# Patient Record
Sex: Female | Born: 1955 | Race: White | Hispanic: No | Smoking: Former smoker
Health system: Southern US, Community
[De-identification: ages and names within clinical notes are randomized; demographics above are authoritative.]

## PROBLEM LIST (undated history)

## (undated) DIAGNOSIS — E7800 Pure hypercholesterolemia, unspecified: Secondary | ICD-10-CM

## (undated) DIAGNOSIS — Z860100 Personal history of colon polyps, unspecified: Secondary | ICD-10-CM

## (undated) DIAGNOSIS — E039 Hypothyroidism, unspecified: Secondary | ICD-10-CM

## (undated) DIAGNOSIS — K589 Irritable bowel syndrome without diarrhea: Secondary | ICD-10-CM

## (undated) DIAGNOSIS — R7303 Prediabetes: Secondary | ICD-10-CM

## (undated) DIAGNOSIS — E559 Vitamin D deficiency, unspecified: Secondary | ICD-10-CM

## (undated) DIAGNOSIS — C189 Malignant neoplasm of colon, unspecified: Secondary | ICD-10-CM

## (undated) DIAGNOSIS — K469 Unspecified abdominal hernia without obstruction or gangrene: Secondary | ICD-10-CM

## (undated) DIAGNOSIS — Z6841 Body Mass Index (BMI) 40.0 and over, adult: Secondary | ICD-10-CM

## (undated) DIAGNOSIS — Z9889 Other specified postprocedural states: Secondary | ICD-10-CM

## (undated) DIAGNOSIS — D649 Anemia, unspecified: Secondary | ICD-10-CM

## (undated) DIAGNOSIS — C801 Malignant (primary) neoplasm, unspecified: Secondary | ICD-10-CM

## (undated) DIAGNOSIS — U071 COVID-19: Secondary | ICD-10-CM

## (undated) DIAGNOSIS — F32A Depression, unspecified: Secondary | ICD-10-CM

## (undated) DIAGNOSIS — D049 Carcinoma in situ of skin, unspecified: Secondary | ICD-10-CM

## (undated) DIAGNOSIS — I059 Rheumatic mitral valve disease, unspecified: Secondary | ICD-10-CM

## (undated) DIAGNOSIS — F419 Anxiety disorder, unspecified: Secondary | ICD-10-CM

## (undated) DIAGNOSIS — E079 Disorder of thyroid, unspecified: Secondary | ICD-10-CM

## (undated) DIAGNOSIS — F329 Major depressive disorder, single episode, unspecified: Secondary | ICD-10-CM

## (undated) DIAGNOSIS — K219 Gastro-esophageal reflux disease without esophagitis: Secondary | ICD-10-CM

## (undated) DIAGNOSIS — I10 Essential (primary) hypertension: Secondary | ICD-10-CM

## (undated) HISTORY — DX: Hypothyroidism, unspecified: E03.9

## (undated) HISTORY — DX: Unspecified abdominal hernia without obstruction or gangrene: K46.9

## (undated) HISTORY — PX: COLONOSCOPY: SHX174

## (undated) HISTORY — DX: Major depressive disorder, single episode, unspecified: F32.9

## (undated) HISTORY — PX: OTHER SURGICAL HISTORY: SHX169

## (undated) HISTORY — DX: Irritable bowel syndrome, unspecified: K58.9

## (undated) HISTORY — DX: Personal history of colon polyps, unspecified: Z86.0100

## (undated) HISTORY — DX: Vitamin D deficiency, unspecified: E55.9

## (undated) HISTORY — DX: Personal history of colonic polyps: Z86.010

## (undated) HISTORY — DX: Gastro-esophageal reflux disease without esophagitis: K21.9

## (undated) HISTORY — DX: Depression, unspecified: F32.A

## (undated) HISTORY — DX: Pure hypercholesterolemia, unspecified: E78.00

## (undated) HISTORY — PX: BLADDER NECK SUSPENSION: SHX1240

## (undated) HISTORY — PX: HERNIA REPAIR: SHX51

## (undated) HISTORY — DX: COVID-19: U07.1

## (undated) HISTORY — DX: Body Mass Index (BMI) 40.0 and over, adult: Z684

## (undated) HISTORY — DX: Malignant (primary) neoplasm, unspecified: C80.1

## (undated) HISTORY — DX: Anxiety disorder, unspecified: F41.9

## (undated) HISTORY — PX: ABDOMINAL HYSTERECTOMY: SHX81

## (undated) HISTORY — DX: Prediabetes: R73.03

## (undated) HISTORY — DX: Malignant neoplasm of colon, unspecified: C18.9

## (undated) HISTORY — DX: Carcinoma in situ of skin, unspecified: D04.9

---

## 1998-08-15 ENCOUNTER — Inpatient Hospital Stay (HOSPITAL_COMMUNITY): Admission: EM | Admit: 1998-08-15 | Discharge: 1998-08-18 | Payer: Self-pay | Admitting: Emergency Medicine

## 1998-08-20 DIAGNOSIS — R203 Hyperesthesia: Secondary | ICD-10-CM | POA: Insufficient documentation

## 1998-08-25 ENCOUNTER — Inpatient Hospital Stay (HOSPITAL_COMMUNITY): Admission: EM | Admit: 1998-08-25 | Discharge: 1998-08-28 | Payer: Self-pay

## 1998-09-02 ENCOUNTER — Encounter: Payer: Self-pay | Admitting: General Surgery

## 1998-09-02 ENCOUNTER — Inpatient Hospital Stay (HOSPITAL_COMMUNITY): Admission: EM | Admit: 1998-09-02 | Discharge: 1998-09-04 | Payer: Self-pay | Admitting: General Surgery

## 1998-09-03 ENCOUNTER — Encounter: Payer: Self-pay | Admitting: General Surgery

## 1998-09-27 ENCOUNTER — Encounter: Payer: Self-pay | Admitting: General Surgery

## 1998-09-29 ENCOUNTER — Inpatient Hospital Stay (HOSPITAL_COMMUNITY): Admission: RE | Admit: 1998-09-29 | Discharge: 1998-10-05 | Payer: Self-pay | Admitting: General Surgery

## 2000-02-29 ENCOUNTER — Other Ambulatory Visit: Admission: RE | Admit: 2000-02-29 | Discharge: 2000-02-29 | Payer: Self-pay | Admitting: Obstetrics and Gynecology

## 2000-05-21 ENCOUNTER — Ambulatory Visit (HOSPITAL_COMMUNITY): Admission: RE | Admit: 2000-05-21 | Discharge: 2000-05-21 | Payer: Self-pay | Admitting: Obstetrics and Gynecology

## 2002-01-20 ENCOUNTER — Encounter: Payer: Self-pay | Admitting: Emergency Medicine

## 2002-01-20 ENCOUNTER — Emergency Department (HOSPITAL_COMMUNITY): Admission: EM | Admit: 2002-01-20 | Discharge: 2002-01-20 | Payer: Self-pay | Admitting: Emergency Medicine

## 2002-02-09 ENCOUNTER — Other Ambulatory Visit: Admission: RE | Admit: 2002-02-09 | Discharge: 2002-02-09 | Payer: Self-pay | Admitting: Obstetrics and Gynecology

## 2002-04-02 ENCOUNTER — Inpatient Hospital Stay (HOSPITAL_COMMUNITY): Admission: RE | Admit: 2002-04-02 | Discharge: 2002-04-03 | Payer: Self-pay | Admitting: Obstetrics and Gynecology

## 2002-05-21 ENCOUNTER — Encounter: Admission: RE | Admit: 2002-05-21 | Discharge: 2002-05-21 | Payer: Self-pay | Admitting: Obstetrics and Gynecology

## 2002-05-21 ENCOUNTER — Encounter: Payer: Self-pay | Admitting: Obstetrics and Gynecology

## 2003-03-25 ENCOUNTER — Other Ambulatory Visit: Admission: RE | Admit: 2003-03-25 | Discharge: 2003-03-25 | Payer: Self-pay | Admitting: Obstetrics and Gynecology

## 2004-03-07 ENCOUNTER — Inpatient Hospital Stay (HOSPITAL_COMMUNITY): Admission: RE | Admit: 2004-03-07 | Discharge: 2004-03-11 | Payer: Self-pay | Admitting: Surgery

## 2004-06-20 ENCOUNTER — Other Ambulatory Visit: Admission: RE | Admit: 2004-06-20 | Discharge: 2004-06-20 | Payer: Self-pay | Admitting: Obstetrics and Gynecology

## 2004-08-16 ENCOUNTER — Encounter: Admission: RE | Admit: 2004-08-16 | Discharge: 2004-08-16 | Payer: Self-pay | Admitting: Surgery

## 2004-09-05 ENCOUNTER — Emergency Department (HOSPITAL_COMMUNITY): Admission: EM | Admit: 2004-09-05 | Discharge: 2004-09-05 | Payer: Self-pay | Admitting: Emergency Medicine

## 2004-09-05 ENCOUNTER — Inpatient Hospital Stay (HOSPITAL_COMMUNITY): Admission: RE | Admit: 2004-09-05 | Discharge: 2004-09-15 | Payer: Self-pay | Admitting: Surgery

## 2004-10-26 ENCOUNTER — Ambulatory Visit: Payer: Self-pay | Admitting: Critical Care Medicine

## 2004-10-26 ENCOUNTER — Inpatient Hospital Stay (HOSPITAL_COMMUNITY): Admission: RE | Admit: 2004-10-26 | Discharge: 2004-11-07 | Payer: Self-pay | Admitting: Surgery

## 2005-03-14 IMAGING — US US RENAL PORT
1 series · 14 of 18 positions shown · non-contrast
Comparison: none

CLINICAL DATA: Elevated BUN and creatinine.
 RENAL SONOGRAM:
 Right kidney 10.3 cm and left kidney 12.3 cm in length.  No evidence of hydronephrosis or no obvious renal mass.  Evaluation is somewhat limited.  On the CT scan of 10/26/04, there may be a small subcentimeter lesion within the posterior cortex of the mid aspect of the left kidney on image 10 (delayed imaging through the kidneys).  This would have to be further evaluated with follow-up CT imaging, as it is not detected on the present ultrasound.  Foley catheter in place and bladder decompressed.

[Series 1: renal · 0.36mm/px · 14 of 18 slices shown]
[im 1/18]
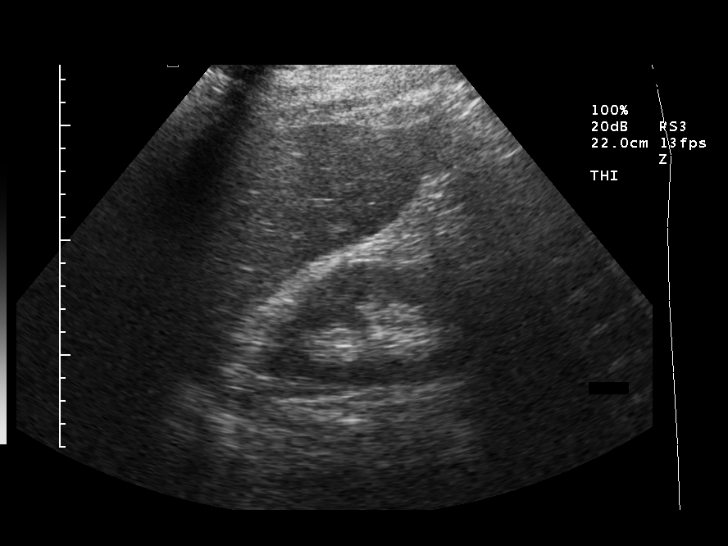
[im 2/18]
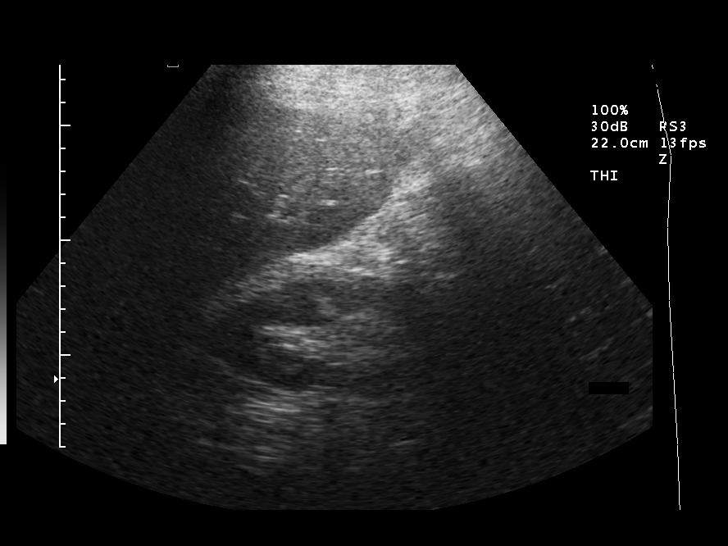
[im 4/18]
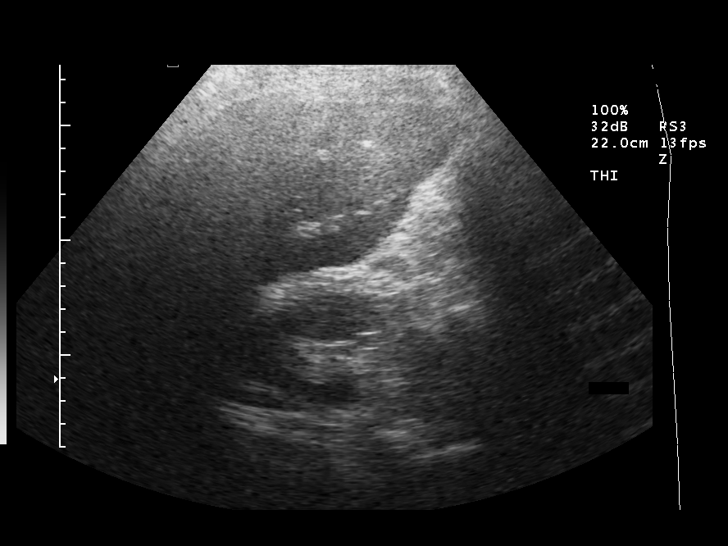
[im 5/18]
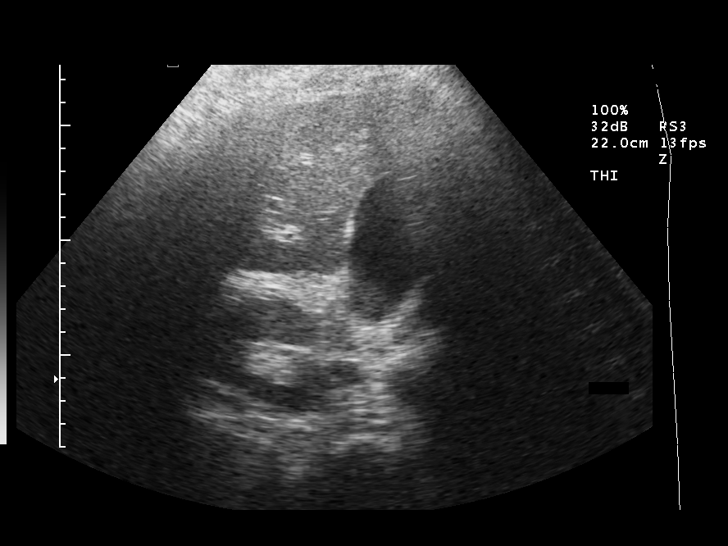
[im 6/18]
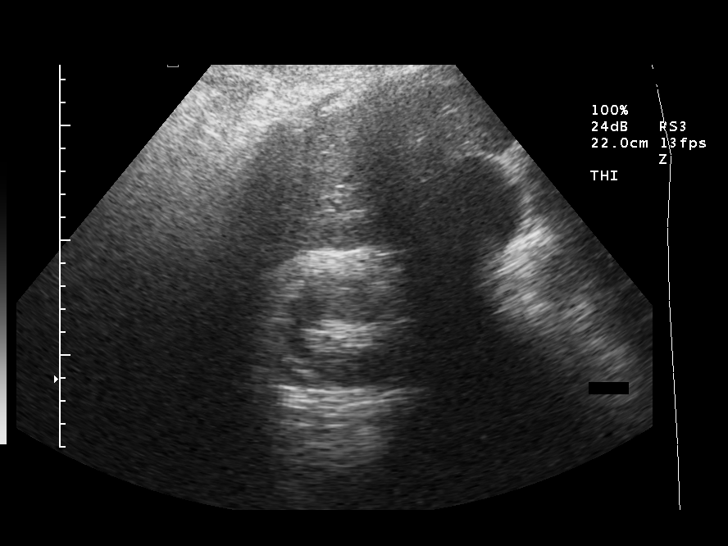
[im 8/18]
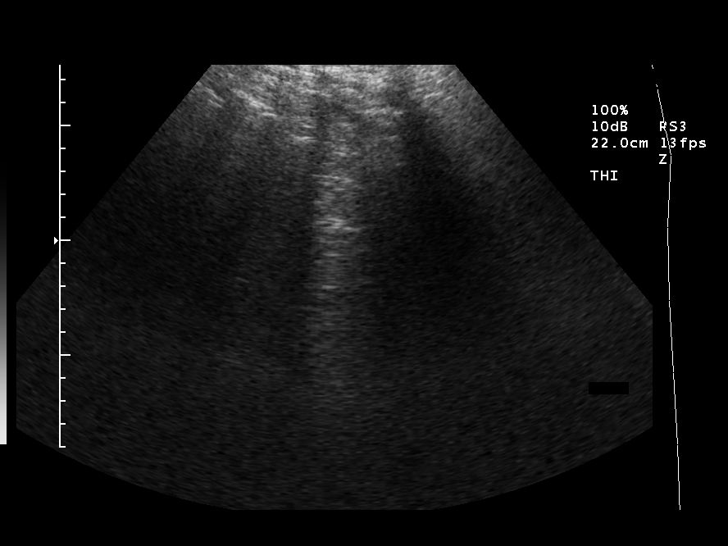
[im 9/18]
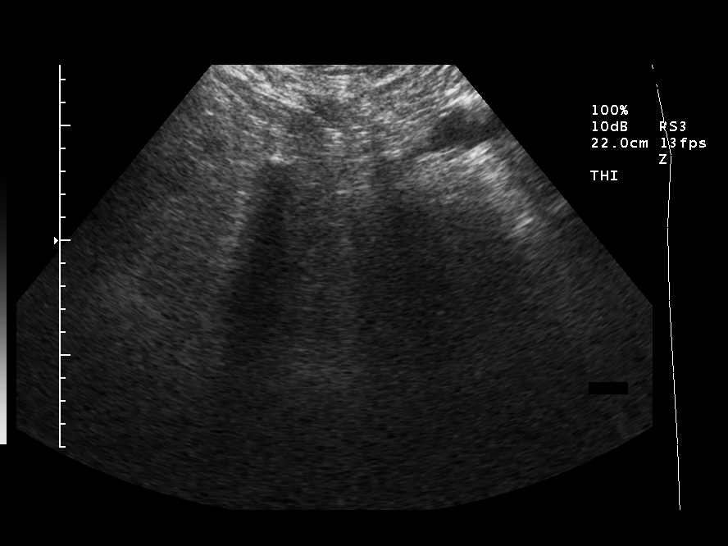
[im 10/18]
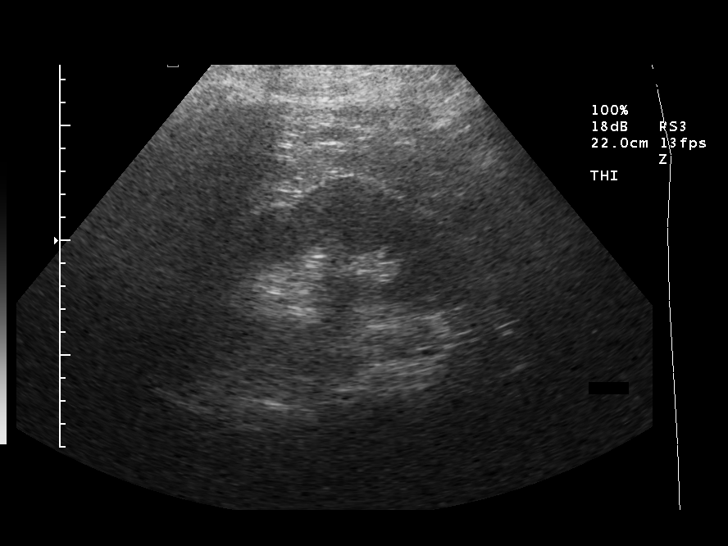
[im 11/18]
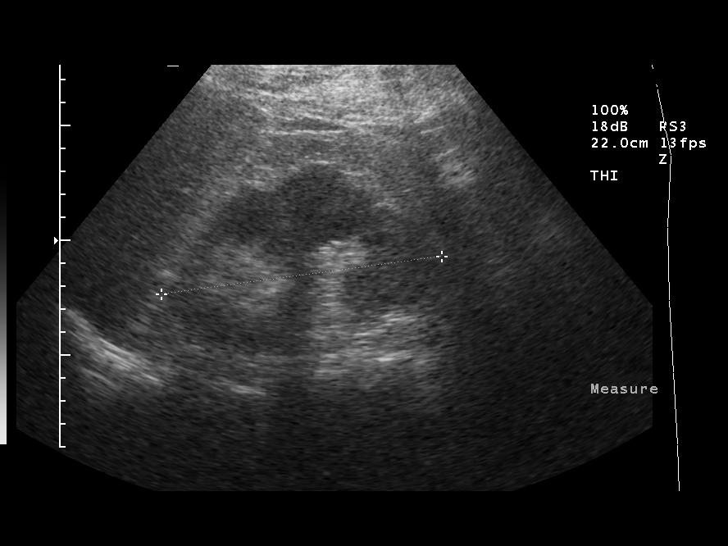
[im 13/18]
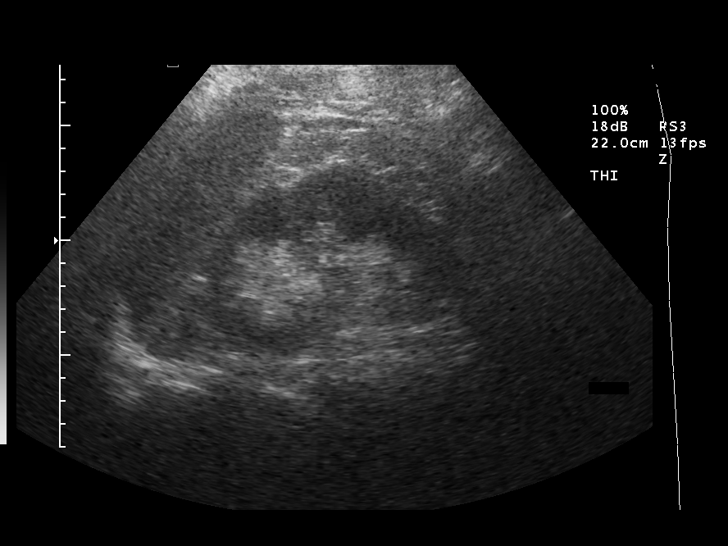
[im 14/18]
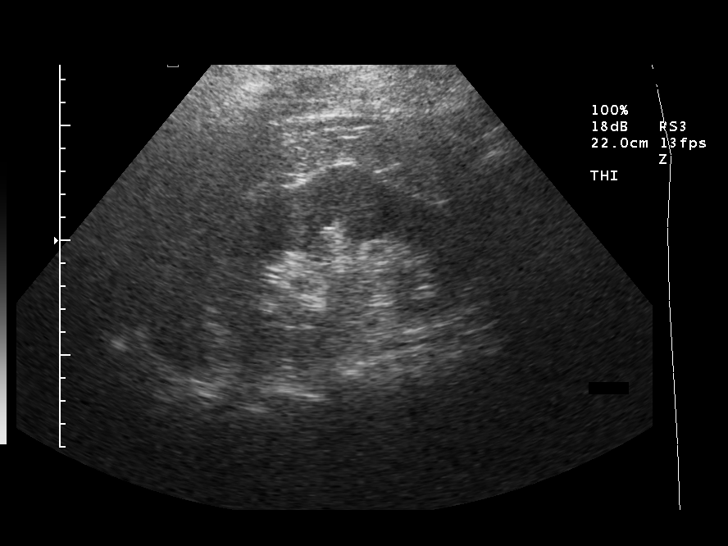
[im 15/18]
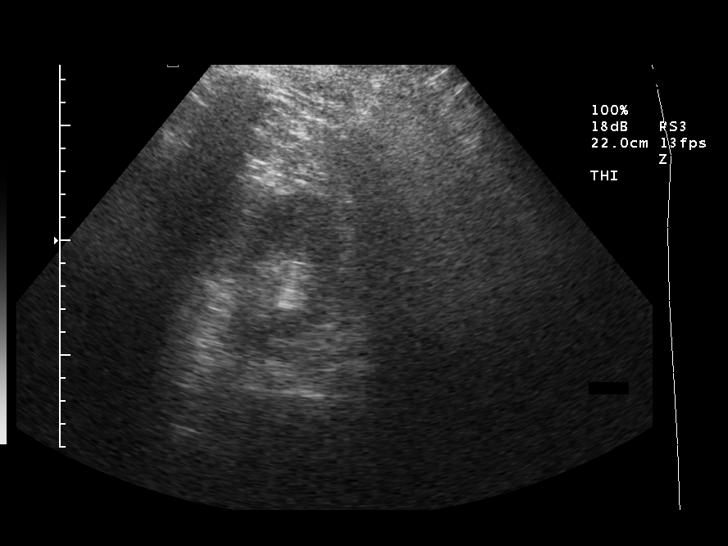
[im 17/18]
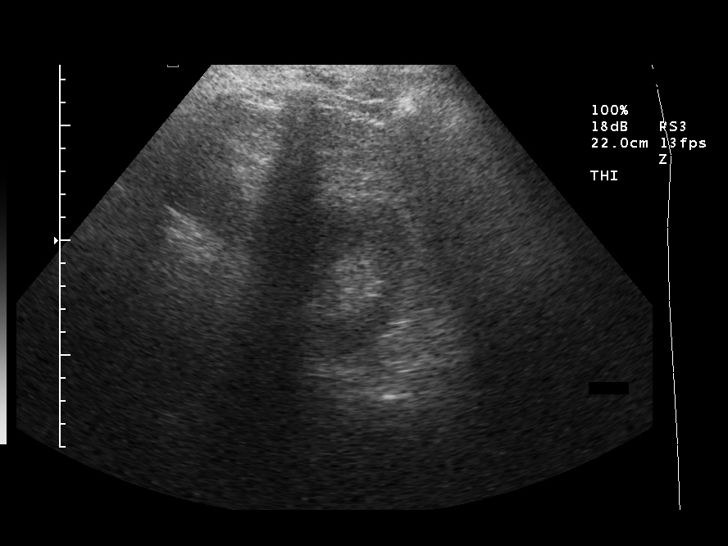
[im 18/18]
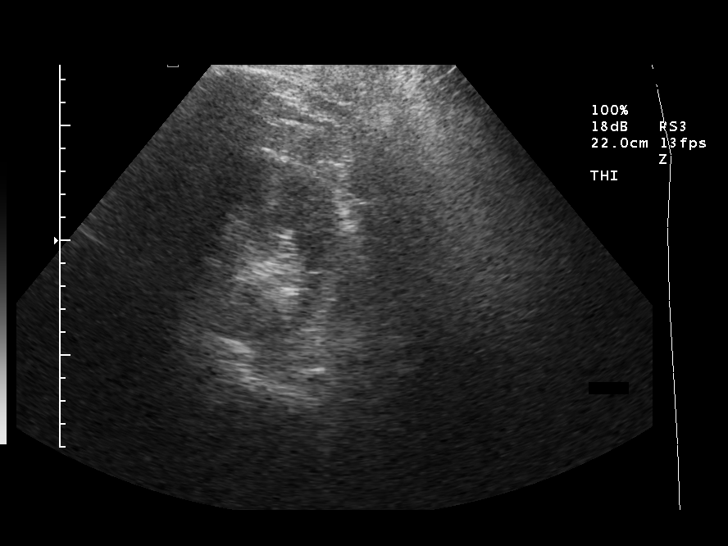

[14 of 18 positions shown; findings below may reference images not displayed]

IMPRESSION: No evidence of hydronephrosis.  Please see above.

## 2005-03-18 IMAGING — CR DG CHEST 1V PORT
1 series · 1 of 1 positions shown · non-contrast
Comparison: 10/31/04.

CLINICAL DATA: Abdominal abscess.  Follow-up. 
 PORTABLE CHEST - 1 VIEW:

[view not recorded]
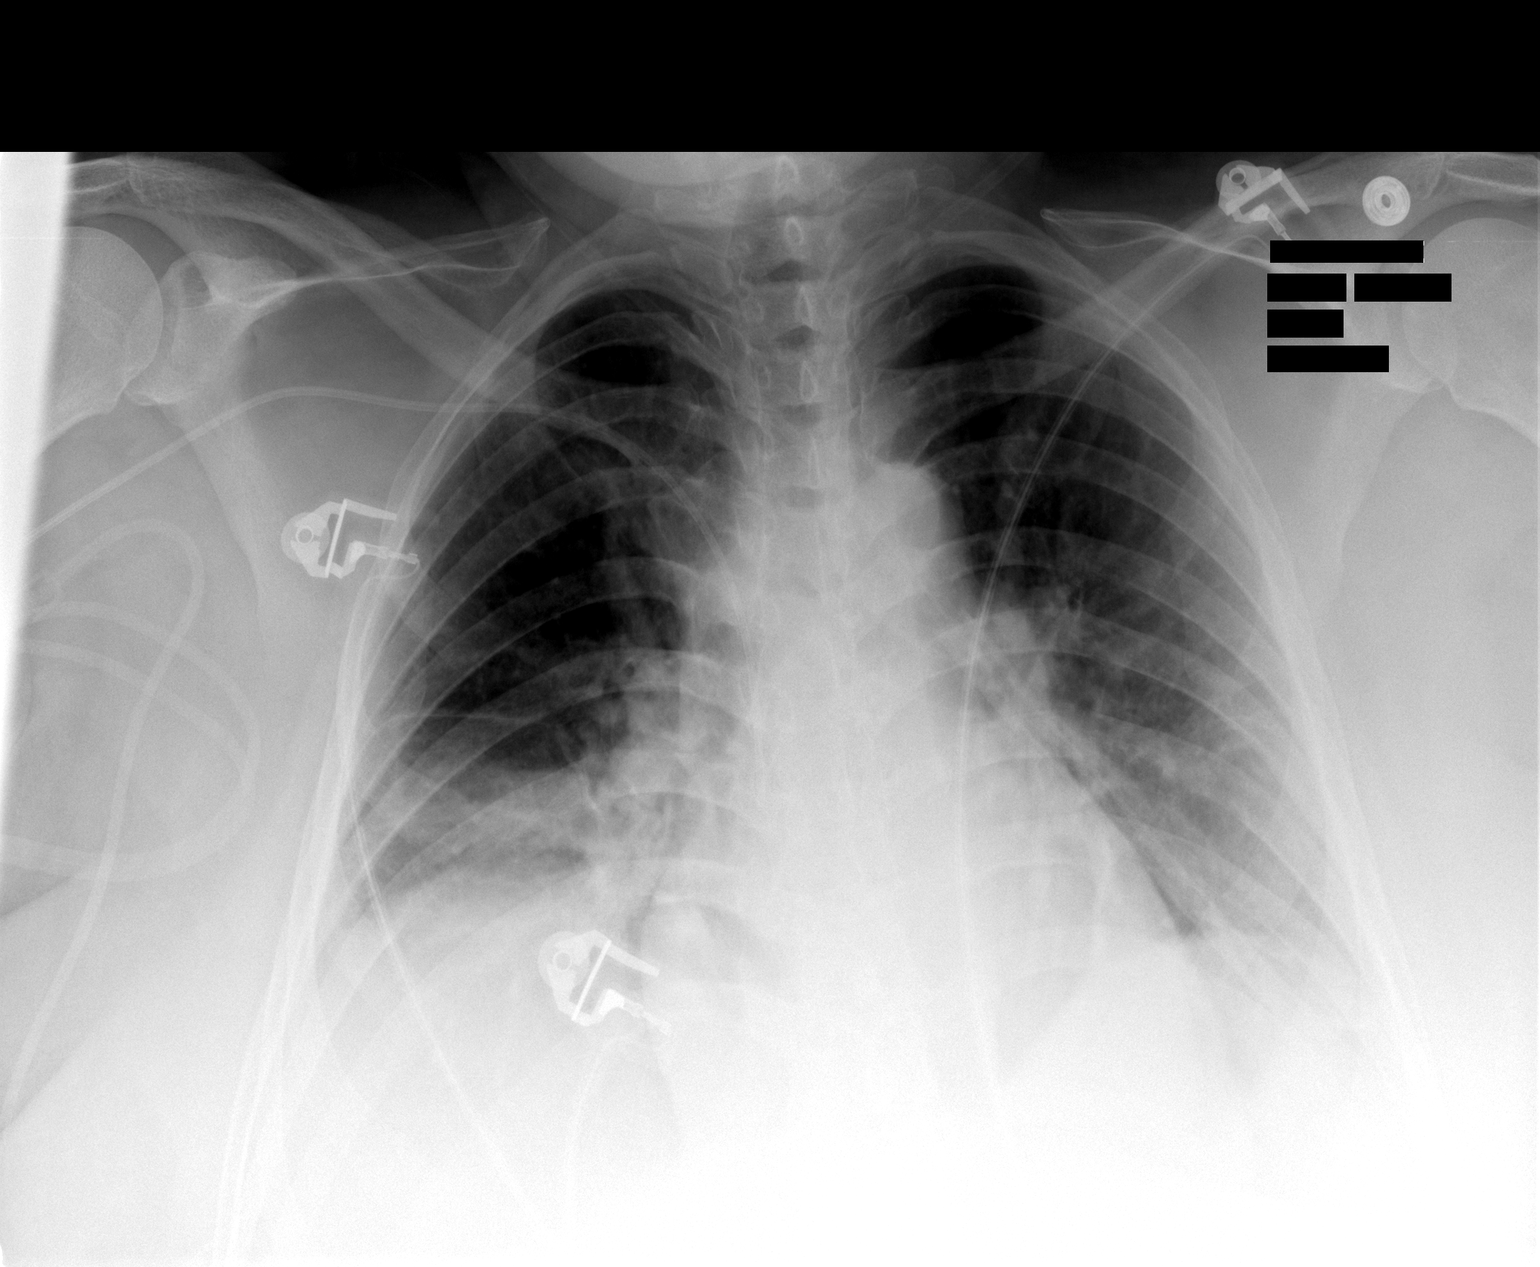

[1 of 1 positions shown; findings below may reference images not displayed]

FINDINGS: Low volume film.  Continued right lower lung atelectasis/airspace disease, right central venous catheter, left basilar atelectasis noted.  Mild cardiomegaly is present.
IMPRESSION: Stable chest.

## 2005-07-05 ENCOUNTER — Other Ambulatory Visit: Admission: RE | Admit: 2005-07-05 | Discharge: 2005-07-05 | Payer: Self-pay | Admitting: Obstetrics and Gynecology

## 2011-06-07 ENCOUNTER — Other Ambulatory Visit: Payer: Self-pay | Admitting: Dermatology

## 2011-07-15 ENCOUNTER — Emergency Department (HOSPITAL_COMMUNITY)
Admission: EM | Admit: 2011-07-15 | Discharge: 2011-07-15 | Disposition: A | Discharge status: Home / Self Care | Payer: BC Managed Care – PPO | Attending: Emergency Medicine | Admitting: Emergency Medicine

## 2011-07-15 ENCOUNTER — Encounter: Payer: Self-pay | Admitting: Emergency Medicine

## 2011-07-15 DIAGNOSIS — I10 Essential (primary) hypertension: Secondary | ICD-10-CM | POA: Insufficient documentation

## 2011-07-15 DIAGNOSIS — L259 Unspecified contact dermatitis, unspecified cause: Secondary | ICD-10-CM | POA: Insufficient documentation

## 2011-07-15 DIAGNOSIS — M79609 Pain in unspecified limb: Secondary | ICD-10-CM | POA: Insufficient documentation

## 2011-07-15 DIAGNOSIS — Z79899 Other long term (current) drug therapy: Secondary | ICD-10-CM | POA: Insufficient documentation

## 2011-07-15 DIAGNOSIS — L309 Dermatitis, unspecified: Secondary | ICD-10-CM

## 2011-07-15 DIAGNOSIS — E079 Disorder of thyroid, unspecified: Secondary | ICD-10-CM | POA: Insufficient documentation

## 2011-07-15 HISTORY — DX: Disorder of thyroid, unspecified: E07.9

## 2011-07-15 HISTORY — DX: Essential (primary) hypertension: I10

## 2011-07-15 MED ORDER — OXYCODONE-ACETAMINOPHEN 5-325 MG PO TABS: ORAL_TABLET | ORAL | Status: AC

## 2011-07-15 MED ORDER — DOXYCYCLINE HYCLATE 100 MG PO CAPS: 100.0000 mg | ORAL_CAPSULE | Freq: Two times a day (BID) | ORAL | Status: AC

## 2011-07-15 MED ORDER — OXYCODONE-ACETAMINOPHEN 5-325 MG PO TABS
2.0000 | ORAL_TABLET | Freq: Once | ORAL | Status: AC
  Administered 2011-07-15: 2 via ORAL
  Filled 2011-07-15: qty 2

## 2011-07-15 NOTE — ED Notes (Signed)
Pt presenting to ed with c/o open sores to her leg with a rash and blisters that were worse today

## 2011-07-15 NOTE — ED Provider Notes (Signed)
History     CSN: [id number] Arrival date & time: 07/15/2011  2:54 PM   First MD Initiated Contact with Patient 07/15/11 1503      Chief Complaint  Patient presents with  . Leg Pain    (Consider location/radiation/quality/duration/timing/severity/associated sxs/prior treatment) HPI  Patient presents to ER complaining of gradual onset rash of right lower leg that she states has been evaluated and treated by her Dermatologist, Dr. Nicholas Lose for many months with rash beginning as pruritic "bumpy" rash of lower leg but over the last couple of weeks increasing redness and ulceration of leg that she states has increasing burning pain and that lesions have increased more so in the last 4-5 days. Denies fevers, chills. Denies rash elsewhere. Patient has stopped using previously prescribed steroid cream on lesions due to burning. States symptoms were gradual onset, persistent and worsening.   Past Medical History  Diagnosis Date  . Hypertension   . Thyroid disease     Past Surgical History  Procedure Date  . Cesarean section     x 2  . Hernia repair   . Laparostomy   . Abdominal hysterectomy   . Colon cancer     History reviewed. No pertinent family history.  History  Substance Use Topics  . Smoking status: Former Games developer  . Smokeless tobacco: Not on file  . Alcohol Use: Yes     occasionally    OB History    Grav Para Term Preterm Abortions TAB SAB Ect Mult Living                  Review of Systems  All other systems reviewed and are negative.    Allergies  Review of patient's allergies indicates no known allergies.  Home Medications   Current Outpatient Rx  Name Route Sig Dispense Refill  . ESTRADIOL 2 MG PO TABS Oral Take 2 mg by mouth daily.      [name] FLUOXETINE HCL 10 MG PO CAPS Oral Take 30 mg by mouth daily.      [name] HYDROCHLOROTHIAZIDE 25 MG PO TABS Oral Take 25 mg by mouth daily.      . IBUPROFEN 200 MG PO TABS Oral Take 800 mg by mouth every 6 (six) hours as  needed. For pain.     [name] LEVOTHYROXINE SODIUM 175 MCG PO TABS Oral Take 175 mcg by mouth daily.      [name] LOSARTAN POTASSIUM 50 MG PO TABS Oral Take 50 mg by mouth daily.      [name] RABEPRAZOLE SODIUM 20 MG PO TBEC Oral Take 20 mg by mouth daily.      [name] ROSUVASTATIN CALCIUM 10 MG PO TABS Oral Take 10 mg by mouth daily.      [name] VITAMIN D (ERGOCALCIFEROL) 50000 UNITS PO CAPS Oral Take 50,000 Units by mouth every 7 (seven) days. Taken on Mondays.     [name] DOXYCYCLINE HYCLATE 100 MG PO CAPS Oral Take 1 capsule (100 mg total) by mouth 2 (two) times daily. 20 capsule 0  . OXYCODONE-ACETAMINOPHEN 5-325 MG PO TABS  Take 1-2 tabs every 4 hours as needed for pain. 15 tablet 0    BP 131/48  Pulse 91  Temp(Src) 98 F (36.7 C) (Oral)  Resp 21  SpO2 96%  Physical Exam  Constitutional: She is oriented to person, place, and time. She appears well-developed and well-nourished. No distress.  HENT:  Head: Normocephalic and atraumatic.  Eyes: Conjunctivae and EOM are normal. Pupils are equal, round, and reactive  to light.  Neck: Neck supple.  Cardiovascular: Normal rate, regular rhythm and normal heart sounds.   Pulmonary/Chest: Effort normal and breath sounds normal. No respiratory distress. She has no wheezes. She has no rales. She exhibits no tenderness.  Abdominal: Soft. Bowel sounds are normal. She exhibits no distension and no mass. There is no tenderness. There is no rebound and no guarding.  Musculoskeletal: She exhibits tenderness. She exhibits no edema.  Neurological: She is alert and oriented to person, place, and time.  Skin: Skin is warm and dry. Rash noted. She is not diaphoretic.       Diffuse rash of macules, plaques and shallow ulceration of right lower leg with TTP but no crepitous or fluctuance. Faint underlying erythema. No edema.     ED Course  Procedures (including critical care time)  PO percocet for pain.  Leg dressed with topical abx and xeroform and loose gauze dressing.  Labs  Reviewed - No data to display No results found.   1. Dermatitis       MDM  Diffuse rash, lesions of right lower leg without signs of clear secondary cellulitis but given diffuse nature of ulcerative rash, will cover with abx. Rash has been present for greater than a month with gradual increase in lesions not consistent with acute infection. No crepitous or signs or symptoms of deeper infection such as necrotizing fasciitis. Patient is afebrile and non toxic appearing. Patient has close follow up with Dermatologist who has been following this rash over the last few months and is agreeble to following up in her office tomorrow for recheck of rash but to return to ER for emergent changing or worsening of symptoms.         Lenon Oms [place], [place] 07/16/11 [phone]

## 2011-07-16 ENCOUNTER — Other Ambulatory Visit: Payer: Self-pay | Admitting: Dermatology

## 2011-07-16 NOTE — ED Provider Notes (Signed)
Medical screening examination/treatment/procedure(s) were conducted as a shared visit with non-physician practitioner(s) and myself.  I personally evaluated the patient during the encounter  Patient has had chronic rash on her right lower extremity for several months.  She is actively being treated by her dermatologist.  She does appear to have worsening of this rash and she does have some open lesions on her right lower extremity.  She may have some degree of early associated cellulitis and thus the patient will be started on doxycycline and she's been instructed to apply antibacterial ointment to her leg twice a day.  She's been instructed to call her dermatologist for followup tomorrow.  She is encouraged return to the ER for any new or worsening symptoms.  It is unclear what this rash is however does not look life-threatening.  Doesn't appear to be toxic epidermal necrolysis or other such serious rashes  Lyanne Co, MD 07/16/11 [phone]

## 2011-12-13 ENCOUNTER — Other Ambulatory Visit: Payer: Self-pay | Admitting: Dermatology

## 2014-06-07 ENCOUNTER — Other Ambulatory Visit: Payer: Self-pay | Admitting: Obstetrics and Gynecology

## 2014-06-08 LAB — CYTOLOGY - PAP

## 2014-06-16 ENCOUNTER — Other Ambulatory Visit (INDEPENDENT_AMBULATORY_CARE_PROVIDER_SITE_OTHER): Payer: Self-pay | Admitting: Surgery

## 2014-06-16 DIAGNOSIS — K432 Incisional hernia without obstruction or gangrene: Secondary | ICD-10-CM

## 2014-06-25 ENCOUNTER — Other Ambulatory Visit: Payer: BC Managed Care – PPO

## 2014-07-12 ENCOUNTER — Other Ambulatory Visit (INDEPENDENT_AMBULATORY_CARE_PROVIDER_SITE_OTHER): Payer: Self-pay | Admitting: Surgery

## 2014-07-12 NOTE — H&P (Signed)
Jacqueline Mora 06/16/2014 11:00 AM Location: [facility] Patient #: [id number] DOB: 05-23-1956 Married / Language: English / Race: White Female  History of Present Illness [name] MD; 06/16/2014 1:15 PM) Patient words: Discuss hernias that had been operated on in the past. Hernias are bi-lat.  The patient is a 58 year old female who presents with an incisional hernia. 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 Had incarcerated umbilical hernia. Underwent reduction and primary repair 1999. Had laparotomy and bowel resection for recurrent small bowel extraction 2000. Underwent laparoscopic repair of recurrent ventral hernia with mesh 2005. Was not candidate for bariatric surgery due to cost and lack of insurance coverage. Was found to have cancer within removed polyp. Underwent laparoscopic low anterior resection 2006. Complicated by wound infection and then mesh infection. Underwent laparoscopic removal of mesh a few months later. Lost to followup. She no longer smokes. She is down to 40 pounds from her last operation. No major cardiac or urinary problems. She did go into renal failure with emergency surgery 2006. That seems to have resolved. She is been trying to lose weight. Started exercising more aggressively. Lost 50 pounds. QUIT smoking! Felt a pulling sensation. Worsening swelling. Concern for recurrent hernia. Comes for evaluation. Followed by Dr. Drema Dallas [name]. Last colonoscopy a few years ago showed no recurrence. However, it she has noticed worsening bouts of intermittent constipation and diarrhea. This concerned her. Seeing Dr Earlean Shawl to consider repeat colonoscopy. Pulling and pain and discomfort. Worried that she may end up and another emergency situation. Afraid to have any more hernia repair also. She comes to me to consider need for surgical  repair.   Other Problems [name], LPN; 63/14/9702 63:78 AM) Anxiety Disorder Back Pain Chronic Renal Failure Syndrome Colon Cancer Diverticulosis Gastroesophageal Reflux Disease Hemorrhoids High blood pressure Hypercholesterolemia Inguinal Hernia Oophorectomy Bilateral. Other disease, cancer, significant illness Thyroid Disease Ventral Hernia Repair  Past Surgical History [name], MD; 06/16/2014 1:10 PM) Cesarean Section - Multiple Colon Polyp Removal - Colonoscopy Colon Polyp Removal - Open Colon Removal - Partial Hysterectomy (not due to cancer) - Complete HERNIORRHAPHY, INCISIONAL, INCARCERATED, LAPAROSCOPIC, INCLUDING MESH INSERTION ([id number])03/07/2004 Right. Laparoscopic with Gore-Tex mesh. Dr. Frederico Hamman Amberg, FEMALE [phone] Dr Jeffie Pollock HERNIORRHAPHY, UMBILICAL, INCARCERATED, IN PATIENT AGE 72 YEARS OR OLDER ([id number])08/15/1998 Incarcerated umbilical hernia with small bowel causing obstruction. Primary repair Dr. Lucia Gaskins SMALL BOWEL RESECTION, WITH ENTEROSTOMY ([id number])08/25/1998 Open lysis of adhesions and small bowel resection for ischemic/necrotic small bowel obstruction REMOVAL OF MESH FROM INCISIONAL HERNIA (11008)10/26/2004 Infected Gore-Tex mesh removed laparoscopically. Dr. Lucia Gaskins.  Diagnostic Studies History [name], LPN; 12/87/8676 72:09 AM) Colonoscopy 1-5 years ago Mammogram within last year Pap Smear 1-5 years ago  Allergies [name], LPN; 47/04/6282 66:29 AM) No Known Drug Allergies10/28/2015  Medication History [name], LPN; 47/65/4650 35:46 AM) Losartan Potassium (50MG  Tablet, Oral) Active. RABEprazole Sodium (20MG  Tablet DR, Oral) Active. Hydrochlorothiazide (25MG  Tablet, Oral) Active. Synthroid (200MCG Tablet, Oral) Active. FLUoxetine HCl (40MG  Capsule, Oral) Active. Crestor (10MG  Tablet, Oral) Active. ALPRAZolam ER (0.5MG  Tablet ER 24HR, Oral) Active. Estradiol (2MG  Tablet,  Oral) Active.  Social History [name], LPN; 56/81/2751 70:01 AM) Alcohol use Moderate alcohol use. Caffeine use Tea. No drug use Tobacco use Former smoker.  Family History [name], LPN; 74/94/4967 59:16 AM) Breast Cancer Family Members In General. Colon Polyps Brother. Diabetes Mellitus Father. Heart Disease Family Members In General. Hypertension Mother. Kidney Disease  Mother. Malignant Neoplasm Of Pancreas Family Members In General.  Pregnancy / Birth History [name], LPN; 93/81/8299 37:16 AM) Age at menarche 46 years. Age of menopause 51-55 Contraceptive History Oral contraceptives. Gravida 2 Maternal age 23-30 Para 2 Regular periods  Review of Systems [name] LPN; 96/78/9381 01:75 AM) General Present- Fatigue, Night Sweats and Weight Gain. Not Present- Appetite Loss, Chills, Fever and Weight Loss. Skin Present- Dryness and Non-Healing Wounds. Not Present- Change in Wart/Mole, Hives, Jaundice, New Lesions, Rash and Ulcer. HEENT Present- Seasonal Allergies, Sinus Pain and Wears glasses/contact lenses. Not Present- Earache, Hearing Loss, Hoarseness, Nose Bleed, Oral Ulcers, Ringing in the Ears, Sore Throat, Visual Disturbances and Yellow Eyes. Respiratory Present- Snoring. Not Present- Bloody sputum, Chronic Cough, Difficulty Breathing and Wheezing. Breast Not Present- Breast Mass, Breast Pain, Nipple Discharge and Skin Changes. Cardiovascular Present- Leg Cramps and Swelling of Extremities. Not Present- Chest Pain, Difficulty Breathing Lying Down, Palpitations, Rapid Heart Rate and Shortness of Breath. Gastrointestinal Present- Abdominal Pain, Bloating, Change in Bowel Habits, Constipation, Hemorrhoids, Indigestion and Rectal Pain. Not Present- Bloody Stool, Chronic diarrhea, Difficulty Swallowing, Excessive gas, Gets full quickly at meals, Nausea and Vomiting. Female Genitourinary Present- Pelvic Pain. Not Present- Frequency,  Nocturia, Painful Urination and Urgency. Musculoskeletal Present- Back Pain, Joint Stiffness, Muscle Pain, Muscle Weakness and Swelling of Extremities. Not Present- Joint Pain. Neurological Present- Decreased Memory, Headaches, Numbness, Tingling, Trouble walking and Weakness. Not Present- Fainting, Seizures and Tremor. Psychiatric Present- Anxiety. Not Present- Bipolar, Change in Sleep Pattern, Depression, Fearful and Frequent crying. Endocrine Present- Excessive Hunger and Hair Changes. Not Present- Cold Intolerance, Heat Intolerance, Hot flashes and New Diabetes. Hematology Present- Easy Bruising. Not Present- Excessive bleeding, Gland problems, HIV and Persistent Infections.   Vitals [name] LPN; 06/13/8526 78:24 AM) 06/16/2014 11:05 AM Weight: 282.13 lb Height: 66in Body Surface Area: 2.44 m Body Mass Index: 45.54 kg/m Temp.: 98.22F(Temporal)  Pulse: 70 (Regular)  Resp.: 18 (Unlabored)  BP: 130/88 (Sitting, Left Arm, Standard)    Physical Exam [name] MD; 06/16/2014 12:11 PM) General Mental Status-Alert. General Appearance-Not in acute distress, Not Sickly. Orientation-Oriented X3. Hydration-Well hydrated. Voice-Normal.  Integumentary Global Assessment Upon inspection and palpation of skin surfaces of the - Axillae: non-tender, no inflammation or ulceration, no drainage. and Distribution of scalp and body hair is normal. General Characteristics Temperature - normal warmth is noted.  Head and Neck Head-normocephalic, atraumatic with no lesions or palpable masses. Face Global Assessment - atraumatic, no absence of expression. Neck Global Assessment - no abnormal movements, no bruit auscultated on the right, no bruit auscultated on the left, no decreased range of motion, non-tender. Trachea-midline. Thyroid Gland Characteristics - non-tender.  Eye Eyeball - Left-Extraocular movements intact, No Nystagmus. Eyeball -  Right-Extraocular movements intact, No Nystagmus. Cornea - Left-No Hazy. Cornea - Right-No Hazy. Sclera/Conjunctiva - Left-No scleral icterus, No Discharge. Sclera/Conjunctiva - Right-No scleral icterus, No Discharge. Pupil - Left-Direct reaction to light normal. Pupil - Right-Direct reaction to light normal.  ENMT Ears Pinna - Left - no drainage observed, no generalized tenderness observed. Right - no drainage observed, no generalized tenderness observed. Nose and Sinuses External Inspection of the Nose - no destructive lesion observed. Inspection of the nares - Left - quiet respiration. Right - quiet respiration. Mouth and Throat Lips - Upper Lip - no fissures observed, no pallor noted. Lower Lip - no fissures observed, no pallor noted. Nasopharynx - no discharge present. Oral Cavity/Oropharynx - Tongue - no dryness observed. Oral Mucosa - no  cyanosis observed. Hypopharynx - no evidence of airway distress observed.  Chest and Lung Exam Inspection Movements - Normal and Symmetrical. Accessory muscles - No use of accessory muscles in breathing. Palpation Palpation of the chest reveals - Non-tender. Auscultation Breath sounds - Normal and Clear.  Cardiovascular Auscultation Rhythm - Regular. Murmurs & Other Heart Sounds - Auscultation of the heart reveals - No Murmurs and No Systolic Clicks.  Abdomen Inspection Inspection of the abdomen reveals - No Visible peristalsis and No Abnormal pulsations. Umbilicus - No Bleeding, No Urine drainage. Palpation/Percussion Palpation and Percussion of the abdomen reveal - Soft, Non Tender, No Rebound tenderness, No Rigidity (guarding) and No Cutaneous hyperesthesia. Note: Morbid obesity with soft. Large midline incision. Supra/Periumbilical swelling 8 x 6 cm region. Suspicious for incarcerated ventral hernia no inguinal or infraumbilical hernias   Female Genitourinary Sexual Maturity Tanner 5 - Adult hair pattern. Note: No  vaginal bleeding nor discharge   Peripheral Vascular Upper Extremity Inspection - Left - No Cyanotic nailbeds, Not Ischemic. Right - No Cyanotic nailbeds, Not Ischemic.  Neurologic Neurologic evaluation reveals -normal attention span and ability to concentrate, able to name objects and repeat phrases. Appropriate fund of knowledge , normal sensation and normal coordination. Mental Status Affect - not angry, not paranoid. Cranial Nerves-Normal Bilaterally. Gait-Normal.  Neuropsychiatric Mental status exam performed with findings of-able to articulate well with normal speech/language, rate, volume and coherence, thought content normal with ability to perform basic computations and apply abstract reasoning and no evidence of hallucinations, delusions, obsessions or homicidal/suicidal ideation.  Musculoskeletal Global Assessment Spine, Ribs and Pelvis - no instability, subluxation or laxity. Right Upper Extremity - no instability, subluxation or laxity.  Lymphatic Head & Neck  General Head & Neck Lymphatics: Bilateral - Description - No Localized lymphadenopathy. Axillary  General Axillary Region: Bilateral - Description - No Localized lymphadenopathy. Femoral & Inguinal  Generalized Femoral & Inguinal Lymphatics: Left - Description - No Localized lymphadenopathy. Right - Description - No Localized lymphadenopathy.    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 She no longer smokes not least. Still morbidly obese but has lost 40 pounds since last surgery. I think this will require repair. Hopefully can be done laparoscopically. Her repair may require combination open approach. Hopefully not severe enough to require a component separation. Would use different sheet of mesh. She is understandably scared to use mesh. Her recurrence rate is extremely high with  primary repair only as evidenced by failure with prior primary repairs. Certainly her risks of bowel injury or other complications is above average, but she's required 3 emergency surgery so I don't think observation alone is realistic.  Another consideration is weight loss surgery prior to hernia repair. She hesistates to consider this given her numerous prior surgeries & lach of certification in 6283. Hold off at this time. She actually had lost 30 more pounds before the hernia caused pain in difficulty in keeping the weight off.  I would like to get a CT scan of the abdomen pelvis to get the lay of the land and see where her hernias are. Make sure she does not have an obstruction point to explain her bowel changes. If hernia massice, consider referral to [place]  I agree with her decision to have repeat colonoscopy given her prior history of rectal cancer and change in bowel habits.  If CAT scan and colonoscopy not showing major contraindications,  planned repair of hernia. May be combined open/laparoscopic approach depending what is incarcerated and out easily comes out. An lysis of adhesions to rule out missed obstruction point since that happened before. Current Plans  [facility] Consent - Hernia Repair - Ventral/Incisional/Umbilical Johney Maine) Schedule for Surgery Pt Education - [facility] Hernia Post-Op HCI (Dianely Krehbiel): discussed with patient and provided information. CONSTIPATION, CHRONIC (564.00  K59.00) Impression: The changes in bowel habits, strongly recommend followup colonoscopy in CAT scan to rule out obstruction or new/recurrent cancer.  Strongly recommended fiber bowel regimen to help correct constipation. Current Plans Pt Education - [facility] Good Bowel Health (Ludean Duhart)   Signed by [name], MD (06/16/2014 1:16 PM)

## 2014-07-12 NOTE — H&P (Signed)
  Addendum: Colonoscopy done shows no evidence of cancer recurrence.  CT scan done.  Shows periumbilical hernia sac.  Primarily supraumbilical.  12 x 10 cm.  The fascial defect smaller.  Around 6 x 5 cm.  I think would benefit from repair since colon within it and Jacqueline Mora's had 2 prior episodes of incarceration and obstruction.  We'll plan laparoscopic possible combined open approach.  Try and use different mesh this time (Parietex last time).  Increased risks with reoperations so we will try and go slowly and safely.  Rrisks of no surgery with 2 prior episodes of incarceration and obstruction to I to ignored.  Jacqueline Mora is no longer smoking.  Jacqueline Mora has lost some weight.  Hopefully that will decrease recurrence chance.

## 2014-07-13 ENCOUNTER — Ambulatory Visit: Payer: BC Managed Care – PPO | Admitting: Cardiovascular Disease

## 2014-09-09 ENCOUNTER — Encounter (HOSPITAL_COMMUNITY): Admission: RE | Payer: Self-pay | Source: Ambulatory Visit

## 2014-09-09 ENCOUNTER — Ambulatory Visit (HOSPITAL_COMMUNITY): Admission: RE | Admit: 2014-09-09 | Payer: BC Managed Care – PPO | Source: Ambulatory Visit | Admitting: Surgery

## 2014-09-09 SURGERY — REPAIR, HERNIA, VENTRAL, LAPAROSCOPIC
Anesthesia: General

## 2017-02-13 ENCOUNTER — Ambulatory Visit (INDEPENDENT_AMBULATORY_CARE_PROVIDER_SITE_OTHER): Payer: BLUE CROSS/BLUE SHIELD | Admitting: Neurology

## 2017-02-13 ENCOUNTER — Encounter (INDEPENDENT_AMBULATORY_CARE_PROVIDER_SITE_OTHER): Payer: Self-pay

## 2017-02-13 ENCOUNTER — Other Ambulatory Visit: Payer: Self-pay | Admitting: *Deleted

## 2017-02-13 ENCOUNTER — Encounter: Payer: Self-pay | Admitting: Neurology

## 2017-02-13 VITALS — BP 174/89 | HR 69 | Ht 66.0 in | Wt 275.0 lb

## 2017-02-13 DIAGNOSIS — R42 Dizziness and giddiness: Secondary | ICD-10-CM

## 2017-02-13 DIAGNOSIS — G44311 Acute post-traumatic headache, intractable: Secondary | ICD-10-CM

## 2017-02-13 DIAGNOSIS — S0083XA Contusion of other part of head, initial encounter: Secondary | ICD-10-CM

## 2017-02-13 DIAGNOSIS — F0781 Postconcussional syndrome: Secondary | ICD-10-CM | POA: Diagnosis not present

## 2017-02-13 MED ORDER — ZOLPIDEM TARTRATE 10 MG PO TABS: 10.0000 mg | ORAL_TABLET | Freq: Every evening | ORAL | 0 refills | Status: DC | PRN

## 2017-02-13 NOTE — Patient Instructions (Signed)
Zolpidem tablets What is this medicine? ZOLPIDEM (zole PI dem) is used to treat insomnia. This medicine helps you to fall asleep and sleep through the night. This medicine may be used for other purposes; ask your health care provider or pharmacist if you have questions. COMMON BRAND NAME(S): Ambien What should I tell my health care provider before I take this medicine? They need to know if you have any of these conditions: -depression -history of drug abuse or addiction -if you often drink alcohol -liver disease -lung or breathing disease -myasthenia gravis -sleep apnea -suicidal thoughts, plans, or attempt; a previous suicide attempt by you or a family member -an unusual or allergic reaction to zolpidem, other medicines, foods, dyes, or preservatives -pregnant or trying to get pregnant -breast-feeding How should I use this medicine? Take this medicine by mouth with a glass of water. Follow the directions on the prescription label. It is better to take this medicine on an empty stomach and only when you are ready for bed. Do not take your medicine more often than directed. If you have been taking this medicine for several weeks and suddenly stop taking it, you may get unpleasant withdrawal symptoms. Your doctor or health care professional may want to gradually reduce the dose. Do not stop taking this medicine on your own. Always follow your doctor or health care professional's advice. A special MedGuide will be given to you by the pharmacist with each prescription and refill. Be sure to read this information carefully each time. Talk to your pediatrician regarding the use of this medicine in children. Special care may be needed. Overdosage: If you think you have taken too much of this medicine contact a poison control center or emergency room at once. NOTE: This medicine is only for you. Do not share this medicine with others. What if I miss a dose? This does not apply. This medicine should  only be taken immediately before going to sleep. Do not take double or extra doses. What may interact with this medicine? -alcohol -antihistamines for allergy, cough and cold -certain medicines for anxiety or sleep -certain medicines for depression, like amitriptyline, fluoxetine, sertraline -certain medicines for fungal infections like ketoconazole and itraconazole -certain medicines for seizures like phenobarbital, primidone -ciprofloxacin -dietary supplements for sleep, like valerian or kava kava -general anesthetics like halothane, isoflurane, methoxyflurane, propofol -local anesthetics like lidocaine, pramoxine, tetracaine -medicines that relax muscles for surgery -narcotic medicines for pain -phenothiazines like chlorpromazine, mesoridazine, prochlorperazine, thioridazine -rifampin This list may not describe all possible interactions. Give your health care provider a list of all the medicines, herbs, non-prescription drugs, or dietary supplements you use. Also tell them if you smoke, drink alcohol, or use illegal drugs. Some items may interact with your medicine. What should I watch for while using this medicine? Visit your doctor or health care professional for regular checks on your progress. Keep a regular sleep schedule by going to bed at about the same time each night. Avoid caffeine-containing drinks in the evening hours. When sleep medicines are used every night for more than a few weeks, they may stop working. Talk to your doctor if you still have trouble sleeping. After taking this medicine for sleep, you may get up out of bed while not being fully awake and do an activity that you do not know you are doing. The next morning, you may have no memory of the event. Activities such as driving a car ("sleep-driving"), making and eating food, talking on the phone, sexual activity,   and sleep-walking have been reported. Call your doctor right away if you find out you have done any of these  activities. Do not take this medicine if you have used alcohol that evening or before bed or taken another medicine for sleep since your risk of doing these sleep-related activities will be increased. Wait for at least 8 hours after you take a dose before driving or doing other activities that require full mental alertness. Do not take this medicine unless you are able to stay in bed for a full night (7 to 8 hours) before you must be active again. You may have a decrease in mental alertness the day after use, even if you feel that you are fully awake. Tell your doctor if you will need to perform activities requiring full alertness, such as driving, the next day. Do not stand or sit up quickly after taking this medicine, especially if you are an older patient. This reduces the risk of dizzy or fainting spells. If you or your family notice any changes in your behavior, such as new or worsening depression, thoughts of harming yourself, anxiety, other unusual or disturbing thoughts, or memory loss, call your doctor right away. After you stop taking this medicine, you may have trouble falling asleep. This is called rebound insomnia. This problem usually goes away on its own after 1 or 2 nights. What side effects may I notice from receiving this medicine? Side effects that you should report to your doctor or health care professional as soon as possible: -allergic reactions like skin rash, itching or hives, swelling of the face, lips, or tongue -breathing problems -changes in vision -confusion -depressed mood or other changes in moods or emotions -feeling faint or lightheaded, falls -hallucinations -loss of balance or coordination -loss of memory -numbness or tingling of the tongue -restlessness, excitability, or feelings of anxiety or agitation -signs and symptoms of liver injury like dark yellow or brown urine; general ill feeling or flu-like symptoms; light-colored stools; loss of appetite; nausea;  right upper belly pain; unusually weak or tired; yellowing of the eyes or skin -suicidal thoughts -unusual activities while asleep like driving, eating, making phone calls, or sexual activity Side effects that usually do not require medical attention (report to your doctor or health care professional if they continue or are bothersome): -dizziness -drowsiness the day after you take this medicine -headache This list may not describe all possible side effects. Call your doctor for medical advice about side effects. You may report side effects to FDA at 1-800-FDA-1088. Where should I keep my medicine? Keep out of the reach of children. This medicine can be abused. Keep your medicine in a safe place to protect it from theft. Do not share this medicine with anyone. Selling or giving away this medicine is dangerous and against the law. This medicine may cause accidental overdose and death if taken by other adults, children, or pets. Mix any unused medicine with a substance like cat litter or coffee grounds. Then throw the medicine away in a sealed container like a sealed bag or a coffee can with a lid. Do not use the medicine after the expiration date. Store at room temperature between 20 and 25 degrees C (68 and 77 degrees F). NOTE: This sheet is a summary. It may not cover all possible information. If you have questions about this medicine, talk to your doctor, pharmacist, or health care provider.  2018 Elsevier/Gold Standard (2015-11-09 14:38:20)  
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
## 2017-02-21 ENCOUNTER — Telehealth: Payer: Self-pay | Admitting: Neurology

## 2017-02-21 NOTE — Telephone Encounter (Signed)
I spoke to the patient by phone and explained the findings of the recent MRI to her. The MRI was performed at [facility] on 02/19/2017.   The patient's MRI of the head was performed without IV contrast for reasons of acute posttraumatic headache. She did have a very small left parietal hyperintensity, size of less than 1 cm. Unclear what this meant. There was no sign of acute or chronic infarct or hemorrhage. There is still an noticeable extracranial soft tissue edema and above the left eyebrow and temple seems to be an Encapsulated hematoma with surrounding edema.  CD

## 2017-03-04 ENCOUNTER — Telehealth: Payer: Self-pay | Admitting: Neurology

## 2017-03-04 NOTE — Telephone Encounter (Signed)
Patient called office in reference to going out of town and will be flying.  Patient has questions about her head injury and would like to speak with a RN before she travels.

## 2017-03-06 NOTE — Telephone Encounter (Signed)
Called pt back yesterday to hear her concern about travelling. Pt stated that her symptoms are better and that she did have a dizzy spell where she had fell but didn't hurt anything or hit head again. Pt is concerned with what is going on if it was safe to travel by airplane. I explained to the patient that it would be fine for her to continue to remember that these symptoms can take time to resolve and remember just to move slow and slow down. She is use to being independent and this issue has slowed her down. I told her I would confirm this issue with Dr Brett Fairy to make sure that what I told her was accurate and Dr Brett Fairy agreed. I told pt that I would only call her back if Dr Brett Fairy disagreed or had any other suggestions. Pt verbalized understanding and was appreciative.

## 2017-03-25 ENCOUNTER — Ambulatory Visit: Payer: BLUE CROSS/BLUE SHIELD | Admitting: Neurology

## 2017-06-19 ENCOUNTER — Encounter: Payer: Self-pay | Admitting: Neurology

## 2017-06-19 ENCOUNTER — Ambulatory Visit (INDEPENDENT_AMBULATORY_CARE_PROVIDER_SITE_OTHER): Payer: BLUE CROSS/BLUE SHIELD | Admitting: Neurology

## 2017-06-19 VITALS — BP 168/85 | HR 86 | Ht 66.0 in | Wt 289.0 lb

## 2017-06-19 DIAGNOSIS — F5102 Adjustment insomnia: Secondary | ICD-10-CM | POA: Diagnosis not present

## 2017-06-19 DIAGNOSIS — G44301 Post-traumatic headache, unspecified, intractable: Secondary | ICD-10-CM

## 2017-06-19 MED ORDER — CLONAZEPAM 0.5 MG PO TABS: 0.5000 mg | ORAL_TABLET | Freq: Two times a day (BID) | ORAL | 0 refills | Status: DC | PRN
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
## 2017-06-19 NOTE — Addendum Note (Signed)
Addended by: Larey Seat on: 06/19/2017 03:29 PM   Modules accepted: Orders

## 2017-12-26 ENCOUNTER — Ambulatory Visit: Payer: BLUE CROSS/BLUE SHIELD | Admitting: Neurology

## 2017-12-26 ENCOUNTER — Other Ambulatory Visit: Payer: Self-pay | Admitting: Neurology

## 2017-12-26 ENCOUNTER — Encounter: Payer: Self-pay | Admitting: Neurology

## 2017-12-26 VITALS — BP 131/78 | HR 87 | Ht 65.5 in | Wt 292.0 lb

## 2017-12-26 DIAGNOSIS — G43719 Chronic migraine without aura, intractable, without status migrainosus: Secondary | ICD-10-CM

## 2017-12-26 DIAGNOSIS — F5102 Adjustment insomnia: Secondary | ICD-10-CM

## 2017-12-26 DIAGNOSIS — R42 Dizziness and giddiness: Secondary | ICD-10-CM | POA: Diagnosis not present

## 2017-12-26 DIAGNOSIS — R110 Nausea: Secondary | ICD-10-CM | POA: Diagnosis not present

## 2017-12-26 MED ORDER — GALCANEZUMAB-GNLM 120 MG/ML ~~LOC~~ SOAJ: 240.0000 mg | SUBCUTANEOUS | 5 refills | Status: DC

## 2017-12-26 MED ORDER — GALCANEZUMAB-GNLM 120 MG/ML ~~LOC~~ SOSY: 240.0000 mL | PREFILLED_SYRINGE | Freq: Once | SUBCUTANEOUS | 0 refills | Status: AC
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
## 2017-12-26 NOTE — Progress Notes (Signed)
Pt presented In office for apt today. Dr Dohmeier has decided to treat patients migraines with Emgality. I have administered the Emgality starter medication in office today. Pt received one injection in the right anterior thigh and the other injection in the left anterior thigh. Pt tolerated the injections well. Educated the pt on how to give the injection to her self and that her next dose is to be given next month 1 injection. Pt verbalized understanding. LOT [id number] EXP 07/2019

## 2017-12-26 NOTE — Addendum Note (Signed)
Addended by: Darleen Crocker on: 12/26/2017 12:11 PM   Modules accepted: Orders

## 2018-01-24 ENCOUNTER — Ambulatory Visit: Payer: BLUE CROSS/BLUE SHIELD | Admitting: Physical Therapy

## 2018-01-29 DIAGNOSIS — R42 Dizziness and giddiness: Secondary | ICD-10-CM | POA: Insufficient documentation

## 2018-01-29 DIAGNOSIS — F0781 Postconcussional syndrome: Secondary | ICD-10-CM | POA: Insufficient documentation

## 2018-07-09 ENCOUNTER — Ambulatory Visit: Payer: BLUE CROSS/BLUE SHIELD | Admitting: Nurse Practitioner

## 2018-09-02 DIAGNOSIS — K5900 Constipation, unspecified: Secondary | ICD-10-CM | POA: Insufficient documentation

## 2019-12-03 LAB — HM COLONOSCOPY

## 2019-12-04 DIAGNOSIS — G43909 Migraine, unspecified, not intractable, without status migrainosus: Secondary | ICD-10-CM | POA: Insufficient documentation

## 2019-12-04 DIAGNOSIS — A6000 Herpesviral infection of urogenital system, unspecified: Secondary | ICD-10-CM | POA: Insufficient documentation

## 2019-12-07 DIAGNOSIS — C439 Malignant melanoma of skin, unspecified: Secondary | ICD-10-CM | POA: Insufficient documentation

## 2020-03-20 DIAGNOSIS — M6281 Muscle weakness (generalized): Secondary | ICD-10-CM | POA: Insufficient documentation

## 2020-11-29 DIAGNOSIS — K219 Gastro-esophageal reflux disease without esophagitis: Secondary | ICD-10-CM | POA: Diagnosis not present

## 2020-11-29 DIAGNOSIS — E7800 Pure hypercholesterolemia, unspecified: Secondary | ICD-10-CM | POA: Diagnosis not present

## 2020-11-29 DIAGNOSIS — E039 Hypothyroidism, unspecified: Secondary | ICD-10-CM | POA: Diagnosis not present

## 2020-11-29 DIAGNOSIS — F419 Anxiety disorder, unspecified: Secondary | ICD-10-CM | POA: Diagnosis not present

## 2020-12-07 NOTE — Progress Notes (Signed)
Cardiology Office Note:    Date:  12/08/2020   ID:  Jacqueline Mora, DOB [birth year], MRN [id number]  PCP:  Wharton, Court[place], [place]  Cardiologist:  Werner Lean, MD  Advanced Practice Provider:  No care team member to display Electrophysiologist:  None       CC: Chest pain Consulted for the evaluation of chest tightness at the behest of [name], [place]  History of Present Illness:    Jacqueline Mora is a 65 y.o. female with a hx of HTN, HLD, who presents for evaluation of chest pain 12/08/20.  Patient notes that she is feeling chest tightness.  Has chest tightness all through the day.  Worse when her husband passed away, has improved coming down on her Prozac.  Chest tightness occurs with problems to breath.  Patient exertion notable for walking up her today and feels no symptoms.  Notes SOB at rest and DOE with going up a quarter of a mile.  Has leg pain with mild treadmill use.[name]  No PND or orthopnea.  No bendopnea, weight gain 20 lbs over the last years, no leg swelling , or abdominal swelling.  Feels like she doesn't have her leg strength and stumbles a lot more, but notes no cardiac syncope.  No palpitations or funny heart beats.     No history of pre-eclampsia, early menarche, or prematurity.  Notes prior Fen-Phen use.  Ambulatory BP 140/150 before recent medication change (increase losartan 100 mg PO)..   Past Medical History:  Diagnosis Date  . Abdominal hernia   . Anxiety   . BMI 45.0-49.9, adult ([place])   . Cancer ([place])   . Colon cancer ([place])   . COVID-19   . Depression   . GERD (gastroesophageal reflux disease)   . History of colon polyps   . Hypercholesteremia   . Hypertension   . Hypothyroidism   . IBS (irritable bowel syndrome)   . Prediabetes   . Squamous cell carcinoma in situ (SCCIS) of skin   . Thyroid disease   . Vitamin D deficiency     Past Surgical History:  Procedure Laterality Date  .  ABDOMINAL HYSTERECTOMY    . CESAREAN SECTION     x 2  . colon cancer    . COLONOSCOPY    . HERNIA REPAIR     Mesh placed then later removed due to infection.  . laparostomy      Current Medications: Current Meds  Medication Sig  . acetaminophen (TYLENOL) 500 MG tablet Take 500 mg by mouth every 6 (six) hours as needed.  . ALPRAZolam (XANAX) 0.5 MG tablet Take 0.5 mg by mouth as needed for anxiety.  . cholecalciferol (VITAMIN D) 1000 units tablet Take 2,000 Units by mouth once a week.   . estradiol (ESTRACE) 2 MG tablet Take 2 mg by mouth daily.  [name] FLUoxetine (PROZAC) 40 MG capsule Take 40 mg by mouth daily.  . fluticasone (FLONASE) 50 MCG/ACT nasal spray as needed.  . hydrochlorothiazide (HYDRODIURIL) 25 MG tablet Take 25 mg by mouth daily.  [name] levothyroxine (SYNTHROID) 175 MCG tablet Take 175 mcg by mouth every morning.  [name] losartan (COZAAR) 100 MG tablet Take 1 tablet by mouth daily.  . RABEprazole (ACIPHEX) 20 MG tablet Take 20 mg by mouth daily.  . [DISCONTINUED] Galcanezumab-gnlm (EMGALITY) 120 MG/ML SOAJ Inject 240 mg into the skin every 30 (thirty) days.  . [DISCONTINUED] levothyroxine (SYNTHROID, LEVOTHROID) 200 MCG tablet Take  200 mcg by mouth daily before breakfast.     Allergies:   Tape and Zocor [simvastatin]   Social History   Socioeconomic History  . Marital status: Married    Spouse name: Not on file  . Number of children: 2  . Years of education: 4 yrs college  . Highest education level: Not on file  Occupational History  . Occupation: Retired  Tobacco Use  . Smoking status: Former Smoker    Types: Cigarettes  . Smokeless tobacco: Never Used  . Tobacco comment: Quit 1984  Vaping Use  . Vaping Use: Never used  Substance and Sexual Activity  . Alcohol use: Yes    Comment: occasionally  . Drug use: No  . Sexual activity: Not on file  Other Topics Concern  . Not on file  Social History Narrative   Lives at home with husband.   Right-handed.   4-5 cups  unsweet tea per week.   Social Determinants of Health   Financial Resource Strain: Not on file  Food Insecurity: Not on file  Transportation Needs: Not on file  Physical Activity: Not on file  Stress: Not on file  Social Connections: Not on file     Family History: The patient's family history includes Diabetes in her father; Kidney cancer in her mother. History of coronary artery disease notable for two aunts. History of PAD in father. History of heart failure notable for no members. History of arrhythmia notable for no members.   ROS:   Please see the history of present illness.     All other systems reviewed and are negative.  EKGs/Labs/Other Studies Reviewed:    The following studies were reviewed today:  EKG:  EKG is  ordered today.  The ekg ordered today demonstrates  12/08/20:  SR rate 73 WNL  Recent Labs: No results found for requested labs within last 8760 hours.  Recent Lipid Panel No results found for: CHOL, TRIG, HDL, CHOLHDL, VLDL, LDLCALC, LDLDIRECT   Risk Assessment/Calculations:     N/A  Physical Exam:    VS:  BP 140/70   Pulse 73   Ht 5\' 6"  (1.676 m)   Wt 292 lb (132.5 kg)   SpO2 96%   BMI 47.13 kg/m     Wt Readings from Last 3 Encounters:  12/08/20 292 lb (132.5 kg)  12/26/17 292 lb (132.5 kg)  06/19/17 289 lb (131.1 kg)     GEN: Morbid Obesity well developed in no acute distress HEENT: Normal NECK: No JVD; No carotid bruits LYMPHATICS: No lymphadenopathy CARDIAC: RRR, no murmurs, rubs, gallops RESPIRATORY:  Clear to auscultation without rales, wheezing or rhonchi  ABDOMEN: Soft, non-tender, non-distended MUSCULOSKELETAL:  Bilateral non-pitting edema; No deformity  SKIN: Warm and dry NEUROLOGIC:  Alert and oriented x 3 PSYCHIATRIC:  Normal affect   ASSESSMENT:    1. DOE (dyspnea on exertion)    PLAN:    In order of problems listed above:  Morbid Obesity with prior FenFen Use HTN HLD DOE Chest tightness Bilateral leg  claudication - will get echocardiogram; if normal without RV dysfunction or WMAs; will not get further testing- if still having chest tightness with normal echo will get NM Stress (discussed with patient) - just started new BP regimen; will not change medications at this time - bilateral LE duplex and ABI's  Summer follow up unless new symptoms or abnormal test results warranting change in plan Would be reasonable for  APP Follow up  Medication Adjustments/Labs and Tests Ordered: Current medicines are reviewed at length with the patient today.  Concerns regarding medicines are outlined above.  Orders Placed This Encounter  Procedures  . ECHOCARDIOGRAM COMPLETE  . VAS [place] ABI WITH/WO TBI  . VAS [place] LOWER EXTREMITY ARTERIAL DUPLEX   No orders of the defined types were placed in this encounter.   Patient Instructions  Medication Instructions:  Your physician recommends that you continue on your current medications as directed. Please refer to the Current Medication list given to you today.  *If you need a refill on your cardiac medications before your next appointment, please call your pharmacy*   Lab Work: NONE If you have labs (blood work) drawn today and your tests are completely normal, you will receive your results only by: [name] MyChart Message (if you have MyChart) OR . A paper copy in the mail If you have any lab test that is abnormal or we need to change your treatment, we will call you to review the results.   Testing/Procedures: Your physician has requested that you have an echocardiogram. Echocardiography is a painless test that uses sound waves to create images of your heart. It provides your doctor with information about the size and shape of your heart and how well your heart's chambers and valves are working. This procedure takes approximately one hour. There are no restrictions for this procedure.  Your physician has requested that you have a lower  extremity  arterial duplex. This test is an ultrasound of the arteries in the legs. It looks at arterial blood flow in the legs. Allow one hour for Lower Arterial scans. There are no restrictions or special instructions  Your physician has requested that you have an ankle brachial index (ABI). During this test an ultrasound and blood pressure cuff are used to evaluate the arteries that supply the arms and legs with blood. Allow thirty minutes for this exam. There are no restrictions or special instructions.    Follow-Up: At [facility], you and your health needs are our priority.  As part of our continuing mission to provide you with exceptional heart care, we have created designated Provider Care Teams.  These Care Teams include your primary Cardiologist (physician) and Advanced Practice Providers (APPs -  Physician Assistants and Nurse Practitioners) who all work together to provide you with the care you need, when you need it.  We recommend signing up for the patient portal called "MyChart".  Sign up information is provided on this After Visit Summary.  MyChart is used to connect with patients for Virtual Visits (Telemedicine).  Patients are able to view lab/test results, encounter notes, upcoming appointments, etc.  Non-urgent messages can be sent to your provider as well.   To learn more about what you can do with MyChart, go to [email or web address].    Your next appointment:   4-6 month(s)  The format for your next appointment:   In Person  Provider:   You may see Rudean Haskell, MD or one of the following Advanced Practice Providers on your designated Care Team:    Melina Copa, PA-C  Ermalinda Barrios, PA-C          Signed, Werner Lean, MD  12/08/2020 6:26 PM    [place]

## 2020-12-08 ENCOUNTER — Encounter: Payer: Self-pay | Admitting: Internal Medicine

## 2020-12-08 ENCOUNTER — Other Ambulatory Visit: Payer: Self-pay

## 2020-12-08 ENCOUNTER — Ambulatory Visit (INDEPENDENT_AMBULATORY_CARE_PROVIDER_SITE_OTHER): Payer: Medicare Other | Admitting: Internal Medicine

## 2020-12-08 VITALS — BP 140/70 | HR 73 | Ht 66.0 in | Wt 292.0 lb

## 2020-12-08 DIAGNOSIS — E785 Hyperlipidemia, unspecified: Secondary | ICD-10-CM

## 2020-12-08 DIAGNOSIS — I10 Essential (primary) hypertension: Secondary | ICD-10-CM

## 2020-12-08 DIAGNOSIS — R079 Chest pain, unspecified: Secondary | ICD-10-CM

## 2020-12-08 DIAGNOSIS — R0609 Other forms of dyspnea: Secondary | ICD-10-CM

## 2020-12-08 DIAGNOSIS — R0600 Dyspnea, unspecified: Secondary | ICD-10-CM

## 2020-12-08 NOTE — Patient Instructions (Signed)
Medication Instructions:  Your physician recommends that you continue on your current medications as directed. Please refer to the Current Medication list given to you today.  *If you need a refill on your cardiac medications before your next appointment, please call your pharmacy*   Lab Work: NONE If you have labs (blood work) drawn today and your tests are completely normal, you will receive your results only by: [name] MyChart Message (if you have MyChart) OR . A paper copy in the mail If you have any lab test that is abnormal or we need to change your treatment, we will call you to review the results.   Testing/Procedures: Your physician has requested that you have an echocardiogram. Echocardiography is a painless test that uses sound waves to create images of your heart. It provides your doctor with information about the size and shape of your heart and how well your heart's chambers and valves are working. This procedure takes approximately one hour. There are no restrictions for this procedure.  Your physician has requested that you have a lower  extremity arterial duplex. This test is an ultrasound of the arteries in the legs. It looks at arterial blood flow in the legs. Allow one hour for Lower Arterial scans. There are no restrictions or special instructions  Your physician has requested that you have an ankle brachial index (ABI). During this test an ultrasound and blood pressure cuff are used to evaluate the arteries that supply the arms and legs with blood. Allow thirty minutes for this exam. There are no restrictions or special instructions.    Follow-Up: At [facility], you and your health needs are our priority.  As part of our continuing mission to provide you with exceptional heart care, we have created designated Provider Care Teams.  These Care Teams include your primary Cardiologist (physician) and Advanced Practice Providers (APPs -  Physician Assistants and Nurse  Practitioners) who all work together to provide you with the care you need, when you need it.  We recommend signing up for the patient portal called "MyChart".  Sign up information is provided on this After Visit Summary.  MyChart is used to connect with patients for Virtual Visits (Telemedicine).  Patients are able to view lab/test results, encounter notes, upcoming appointments, etc.  Non-urgent messages can be sent to your provider as well.   To learn more about what you can do with MyChart, go to [email or web address].    Your next appointment:   4-6 month(s)  The format for your next appointment:   In Person  Provider:   You may see Rudean Haskell, MD or one of the following Advanced Practice Providers on your designated Care Team:    Melina Copa, PA-C  Ermalinda Barrios, PA-C

## 2020-12-09 ENCOUNTER — Other Ambulatory Visit: Payer: Self-pay | Admitting: Internal Medicine

## 2020-12-09 DIAGNOSIS — I739 Peripheral vascular disease, unspecified: Secondary | ICD-10-CM

## 2020-12-09 DIAGNOSIS — R0609 Other forms of dyspnea: Secondary | ICD-10-CM

## 2020-12-09 DIAGNOSIS — R0600 Dyspnea, unspecified: Secondary | ICD-10-CM

## 2020-12-09 NOTE — Addendum Note (Signed)
Addended by: Jeremy Johann on: 12/09/2020 03:23 PM   Modules accepted: Orders

## 2020-12-12 ENCOUNTER — Other Ambulatory Visit: Payer: Self-pay

## 2020-12-12 ENCOUNTER — Ambulatory Visit (HOSPITAL_COMMUNITY)
Admission: RE | Admit: 2020-12-12 | Discharge: 2020-12-12 | Disposition: A | Discharge status: Home / Self Care | Payer: Medicare Other | Source: Ambulatory Visit | Attending: Cardiology | Admitting: Cardiology

## 2020-12-12 DIAGNOSIS — R0600 Dyspnea, unspecified: Secondary | ICD-10-CM | POA: Diagnosis not present

## 2020-12-12 DIAGNOSIS — R0609 Other forms of dyspnea: Secondary | ICD-10-CM

## 2020-12-12 DIAGNOSIS — I739 Peripheral vascular disease, unspecified: Secondary | ICD-10-CM | POA: Diagnosis not present

## 2020-12-15 DIAGNOSIS — L821 Other seborrheic keratosis: Secondary | ICD-10-CM | POA: Diagnosis not present

## 2020-12-15 DIAGNOSIS — D225 Melanocytic nevi of trunk: Secondary | ICD-10-CM | POA: Diagnosis not present

## 2020-12-15 DIAGNOSIS — L550 Sunburn of first degree: Secondary | ICD-10-CM | POA: Diagnosis not present

## 2020-12-15 DIAGNOSIS — L304 Erythema intertrigo: Secondary | ICD-10-CM | POA: Diagnosis not present

## 2021-01-09 ENCOUNTER — Ambulatory Visit (HOSPITAL_COMMUNITY): Payer: Medicare Other | Attending: Cardiovascular Disease

## 2021-01-09 ENCOUNTER — Other Ambulatory Visit: Payer: Self-pay

## 2021-01-09 DIAGNOSIS — R0600 Dyspnea, unspecified: Secondary | ICD-10-CM | POA: Insufficient documentation

## 2021-01-09 DIAGNOSIS — R0609 Other forms of dyspnea: Secondary | ICD-10-CM

## 2021-01-09 LAB — ECHOCARDIOGRAM COMPLETE
Area-P 1/2: 3.48 cm2
S' Lateral: 3 cm

## 2021-01-19 ENCOUNTER — Telehealth: Payer: Self-pay | Admitting: Internal Medicine

## 2021-01-19 DIAGNOSIS — R079 Chest pain, unspecified: Secondary | ICD-10-CM

## 2021-01-19 MED ORDER — FUROSEMIDE 20 MG PO TABS: 20.0000 mg | ORAL_TABLET | Freq: Every day | ORAL | 1 refills | Status: DC

## 2021-01-19 NOTE — Telephone Encounter (Signed)
Spoke with patient about echo results and ordered her Lasix 20 mg daily.  Patient requested it be sent to [facility] mail order.  Prescription sent.  Patient will come to the office for a BMP on 02/01/21.  Patient states she still has chest pain but is also sick [place] the beach.  She is not really sure if it is better as of this note.  Advised to call with any questions or issues.

## 2021-01-19 NOTE — Telephone Encounter (Signed)
° °  Pt is returning call to get echo result °

## 2021-01-23 DIAGNOSIS — R0981 Nasal congestion: Secondary | ICD-10-CM | POA: Diagnosis not present

## 2021-01-23 DIAGNOSIS — J209 Acute bronchitis, unspecified: Secondary | ICD-10-CM | POA: Diagnosis not present

## 2021-01-25 ENCOUNTER — Telehealth: Payer: Self-pay | Admitting: Internal Medicine

## 2021-01-25 NOTE — Telephone Encounter (Signed)
Left a message for pt to call back

## 2021-01-25 NOTE — Telephone Encounter (Signed)
Promethazine/dextromethorphan, benzonatate, prednisone.   HCTZ and furosemide are okay to use with current therapy as long as promethazine, benzonatate, and prednisone are short term therapy to sue as needed for cough.  Not recommended to use promethazine/DM with Prozac (on medication profile)

## 2021-01-25 NOTE — Telephone Encounter (Signed)
Spoke with pt, informed her of pharmacist recommendations.  Pt expressed that she will contact prescribing provider to discuss alternative choice(s) for medications.  All questions answered.

## 2021-01-25 NOTE — Telephone Encounter (Signed)
Pt c/o medication issue:  1. Name of Medication: Tromethazine 6.25-15 mg/21mL, Benzonatate 200 mg, and Prednisone 10 mg (for a Cough)  2. How are you currently taking this medication (dosage and times per day)? As directed   3. Are you having a reaction (difficulty breathing--STAT)? no  4. What is your medication issue? Patient wants to make sure none of these medications interact with the hydrochlorothiazide (HYDRODIURIL) 25 MG tablet or the furosemide (LASIX) 20 MG tablet

## 2021-02-01 ENCOUNTER — Other Ambulatory Visit: Payer: Medicare Other

## 2021-02-03 DIAGNOSIS — Z1272 Encounter for screening for malignant neoplasm of vagina: Secondary | ICD-10-CM | POA: Diagnosis not present

## 2021-02-03 DIAGNOSIS — Z6841 Body Mass Index (BMI) 40.0 and over, adult: Secondary | ICD-10-CM | POA: Diagnosis not present

## 2021-02-03 DIAGNOSIS — Z1231 Encounter for screening mammogram for malignant neoplasm of breast: Secondary | ICD-10-CM | POA: Diagnosis not present

## 2021-02-03 DIAGNOSIS — Z01419 Encounter for gynecological examination (general) (routine) without abnormal findings: Secondary | ICD-10-CM | POA: Diagnosis not present

## 2021-02-16 ENCOUNTER — Other Ambulatory Visit: Payer: Medicare Other

## 2021-03-09 ENCOUNTER — Other Ambulatory Visit: Payer: Self-pay

## 2021-03-09 ENCOUNTER — Other Ambulatory Visit: Payer: Medicare Other

## 2021-03-09 DIAGNOSIS — R079 Chest pain, unspecified: Secondary | ICD-10-CM | POA: Diagnosis not present

## 2021-03-09 LAB — BASIC METABOLIC PANEL
BUN/Creatinine Ratio: 20 (ref 12–28)
BUN: 17 mg/dL (ref 8–27)
CO2: 24 mmol/L (ref 20–29)
Calcium: 8.8 mg/dL (ref 8.7–10.3)
Chloride: 99 mmol/L (ref 96–106)
Creatinine, Ser: 0.84 mg/dL (ref 0.57–1.00)
Glucose: 105 mg/dL — ABNORMAL HIGH (ref 65–99)
Potassium: 4.1 mmol/L (ref 3.5–5.2)
Sodium: 136 mmol/L (ref 134–144)
eGFR: 77 mL/min/{1.73_m2} (ref >59)

## 2021-05-02 NOTE — Progress Notes (Signed)
Cardiology Office Note:    Date:  05/03/2021   ID:  Shanna Cisco, DOB 1956/01/03, MRN [id number]  PCP:  Wharton, Court[place], [place]  Cardiologist:  Werner Lean, MD  Advanced Practice Provider:  No care team member to display Electrophysiologist:  None      CC: Chest pain f/u  History of Present Illness:    Jacqueline Mora is a 65 y.o. female with a hx of HTN, HLD, who presents for evaluation of chest pain 12/08/20.  In interim of this visit, patient has diastolic dysfunction on echo and started on low dose lasix has had higher dose of losartan. Seen 05/02/21.  Patient notes that she is doing better. Her chest tightness has improved but still present.  Had low iron and started iron supplementation.  This has improved the fatigue.  Still having leg swelling.  Has gained some weight.  With exercise last 7 minutes no a treadmill.  Still has some dizziness. Has a good response to lasix.    AMB BP 132/80 logged review.  Past Medical History:  Diagnosis Date   Abdominal hernia    Anxiety    BMI 45.0-49.9, adult ([place])    Cancer (HCC)    Colon cancer ([place])    COVID-19    Depression    GERD (gastroesophageal reflux disease)    History of colon polyps    Hypercholesteremia    Hypertension    Hypothyroidism    IBS (irritable bowel syndrome)    Prediabetes    Squamous cell carcinoma in situ (SCCIS) of skin    Thyroid disease    Vitamin D deficiency     Past Surgical History:  Procedure Laterality Date   ABDOMINAL HYSTERECTOMY     CESAREAN SECTION     x 2   colon cancer     COLONOSCOPY     HERNIA REPAIR     Mesh placed then later removed due to infection.   laparostomy      Current Medications: Current Meds  Medication Sig   acetaminophen (TYLENOL) 500 MG tablet Take 500 mg by mouth every 6 (six) hours as needed.   ALPRAZolam (XANAX) 0.5 MG tablet Take 0.5 mg by mouth as needed for anxiety.   cholecalciferol (VITAMIN D) 1000  units tablet Take 2,000 Units by mouth once a week.    estradiol (ESTRACE) 2 MG tablet Take 2 mg by mouth daily.   FLUoxetine (PROZAC) 40 MG capsule Take 30 mg by mouth daily.   fluticasone (FLONASE) 50 MCG/ACT nasal spray as needed.   furosemide (LASIX) 40 MG tablet Take 1 tablet (40 mg total) by mouth daily.   levothyroxine (SYNTHROID) 175 MCG tablet Take 175 mcg by mouth every morning.   losartan (COZAAR) 100 MG tablet Take 1 tablet by mouth daily.   RABEprazole (ACIPHEX) 20 MG tablet Take 20 mg by mouth daily.   [DISCONTINUED] furosemide (LASIX) 20 MG tablet Take 1 tablet (20 mg total) by mouth daily.   [DISCONTINUED] hydrochlorothiazide (HYDRODIURIL) 25 MG tablet Take 25 mg by mouth daily.     Allergies:   Tape and Zocor [simvastatin]   Social History   Socioeconomic History   Marital status: Widowed    Spouse name: Not on file   Number of children: 2   Years of education: 4 yrs college   Highest education level: Not on file  Occupational History   Occupation: Retired  Tobacco Use   Smoking status: Former  Types: Cigarettes   Smokeless tobacco: Never   Tobacco comments:    Quit 1984  Vaping Use   Vaping Use: Never used  Substance and Sexual Activity   Alcohol use: Yes    Comment: occasionally   Drug use: No   Sexual activity: Not on file  Other Topics Concern   Not on file  Social History Narrative   Lives at home with husband.   Right-handed.   4-5 cups unsweet tea per week.   Social Determinants of Health   Financial Resource Strain: Not on file  Food Insecurity: Not on file  Transportation Needs: Not on file  Physical Activity: Not on file  Stress: Not on file  Social Connections: Not on file    Social: brother had a 4Vd Bypass  Family History: The patient's family history includes Diabetes in her father; Kidney cancer in her mother. History of coronary artery disease notable for two aunts and brother. History of PAD in father. History of heart  failure notable for no members. History of arrhythmia notable for no members.  ROS:   Please see the history of present illness.     All other systems reviewed and are negative.  EKGs/Labs/Other Studies Reviewed:    The following studies were reviewed today:  EKG:   05/03/21: SR rate 70 borderline anterior infarct 12/08/20:  SR rate 73 WNL  Transthoracic Echocardiogram: Date: 01/09/21 Results:  1. Left ventricular ejection fraction, by estimation, is 60 to 65%. The  left ventricle has normal function. The left ventricle has no regional  wall motion abnormalities. There is mild left ventricular hypertrophy.  Left ventricular diastolic parameters  are consistent with Grade II diastolic dysfunction (pseudonormalization).  Elevated left ventricular end-diastolic pressure.   2. Right ventricular systolic function is normal. The right ventricular  size is normal.   3. Left atrial size was mildly dilated.   4. The mitral valve is abnormal. Trivial mitral valve regurgitation. No  evidence of mitral stenosis.   5. The aortic valve was not well visualized. There is mild calcification  of the aortic valve. Aortic valve regurgitation is not visualized. Mild  aortic valve sclerosis is present, with no evidence of aortic valve  stenosis.   6. The inferior vena cava is normal in size with greater than 50%  respiratory variability, suggesting right atrial pressure of 3 mmHg.     Recent Labs: 03/09/2021: BUN 17; Creatinine, Ser 0.84; Potassium 4.1; Sodium 136  Recent Lipid Panel No results found for: CHOL, TRIG, HDL, CHOLHDL, VLDL, LDLCALC, LDLDIRECT   Physical Exam:    VS:  BP 120/80 (BP Location: Left Arm, Patient Position: Sitting, Cuff Size: Large)   Pulse 67   Ht '5\' 6"'$  (1.676 m)   Wt 294 lb (133.4 kg)   BMI 47.45 kg/m     Wt Readings from Last 3 Encounters:  05/03/21 294 lb (133.4 kg)  12/08/20 292 lb (132.5 kg)  12/26/17 292 lb (132.5 kg)     GEN: Morbid Obesity well  developed in no acute distress HEENT: Normal NECK: No JVD LYMPHATICS: No lymphadenopathy CARDIAC: RRR, no murmurs, rubs, gallops RESPIRATORY:  Clear to auscultation without rales, wheezing or rhonchi  ABDOMEN: Soft, non-tender, non-distended MUSCULOSKELETAL:  Bilateral non-pitting edema slightly improved from last time; No deformity  SKIN: Warm and dry NEUROLOGIC:  Alert and oriented x 3 PSYCHIATRIC:  Normal affect   ASSESSMENT:    1. Chest pain of uncertain etiology   2. Morbid obesity ([place])   3.  Essential hypertension   4. Hyperlipidemia, unspecified hyperlipidemia type   5. DOE (dyspnea on exertion)   6. Precordial pain     PLAN:    In order of problems listed above:  Morbid Obesity with prior FenFen Use HTN HLD DOE Chest tightness Bilateral leg claudication - will schedule Lexi-scan  - will increase lasix to 40 mg stop HCTZ and check bmp in one to two weeks - continue losartan 100 mg  - bilateral LE duplex and ABI's  Will plan for 3-4 months follow up unless new symptoms or abnormal test results warranting change in plan Would be reasonable for  APP Follow up  Video visit if positive for LHC        Medication Adjustments/Labs and Tests Ordered: Current medicines are reviewed at length with the patient today.  Concerns regarding medicines are outlined above.  Orders Placed This Encounter  Procedures   Basic metabolic panel   Cardiac Stress Test: Informed Consent Details: Physician/Practitioner Attestation; Transcribe to consent form and obtain patient signature   MYOCARDIAL PERFUSION IMAGING   EKG 12-Lead    Meds ordered this encounter  Medications   furosemide (LASIX) 40 MG tablet    Sig: Take 1 tablet (40 mg total) by mouth daily.    Dispense:  90 tablet    Refill:  3     Patient Instructions  Medication Instructions:  Your physician has recommended you make the following change in your medication:  INCREASE: furosemide (Lasix) to 40 mg by  mouth daily STOP: HCTZ (hydrochlorothiazide)  *If you need a refill on your cardiac medications before your next appointment, please call your pharmacy*   Lab Work: IN 7-10 DAYS: BMP If you have labs (blood work) drawn today and your tests are completely normal, you will receive your results only by: [place] (if you have MyChart) OR A paper copy in the mail If you have any lab test that is abnormal or we need to change your treatment, we will call you to review the results.   Testing/Procedures: Your physician has requested that you have a lexiscan myoview. For further information please visit [email or web address]. Please follow instruction sheet, as given.    Follow-Up: At [facility], you and your health needs are our priority.  As part of our continuing mission to provide you with exceptional heart care, we have created designated Provider Care Teams.  These Care Teams include your primary Cardiologist (physician) and Advanced Practice Providers (APPs -  Physician Assistants and Nurse Practitioners) who all work together to provide you with the care you need, when you need it.   Your next appointment:   3-4 month(s)  The format for your next appointment:   In Person  Provider:   You may see Werner Lean, MD or one of the following Advanced Practice Providers on your designated Care Team:   Melina Copa, PA-C Ermalinda Barrios, PA-C   Other Instructions  You are scheduled for a Myocardial Perfusion Imaging Study. Please arrive 15 minutes prior to your appointment time for registration and insurance purposes.   The test will take approximately 3 to 4 hours to complete; you may bring reading material.  If someone comes with you to your appointment, they will need to remain in the main lobby due to limited space in the testing area.    How to prepare for your Myocardial Perfusion Test: Do not eat or drink 3 hours prior to your test, except you may have water. Do  not  consume products containing caffeine (regular or decaffeinated) 12 hours prior to your test. (ex: coffee, chocolate, sodas, tea). Do bring a list of your current medications with you.  If not listed below, you may take your medications as normal. Do wear comfortable clothes (no dresses or overalls) and walking shoes, tennis shoes preferred (No heels or open toe shoes are allowed). Do NOT wear cologne, perfume, aftershave, or lotions (deodorant is allowed). If these instructions are not followed, your test will have to be rescheduled.  If you cannot keep your appointment, please provide 24 hours notification to the Nuclear Lab, to avoid a possible $50 charge to your account.        Signed, Werner Lean, MD  05/03/2021 4:35 PM    [place]

## 2021-05-03 ENCOUNTER — Other Ambulatory Visit: Payer: Self-pay

## 2021-05-03 ENCOUNTER — Encounter: Payer: Self-pay | Admitting: Internal Medicine

## 2021-05-03 ENCOUNTER — Ambulatory Visit: Payer: Medicare Other | Admitting: Internal Medicine

## 2021-05-03 VITALS — BP 120/80 | HR 67 | Ht 66.0 in | Wt 294.0 lb

## 2021-05-03 DIAGNOSIS — E785 Hyperlipidemia, unspecified: Secondary | ICD-10-CM | POA: Diagnosis not present

## 2021-05-03 DIAGNOSIS — R0609 Other forms of dyspnea: Secondary | ICD-10-CM

## 2021-05-03 DIAGNOSIS — R072 Precordial pain: Secondary | ICD-10-CM

## 2021-05-03 DIAGNOSIS — R079 Chest pain, unspecified: Secondary | ICD-10-CM | POA: Diagnosis not present

## 2021-05-03 DIAGNOSIS — R29898 Other symptoms and signs involving the musculoskeletal system: Secondary | ICD-10-CM | POA: Diagnosis not present

## 2021-05-03 DIAGNOSIS — E538 Deficiency of other specified B group vitamins: Secondary | ICD-10-CM | POA: Diagnosis not present

## 2021-05-03 DIAGNOSIS — E559 Vitamin D deficiency, unspecified: Secondary | ICD-10-CM | POA: Diagnosis not present

## 2021-05-03 DIAGNOSIS — R0600 Dyspnea, unspecified: Secondary | ICD-10-CM

## 2021-05-03 DIAGNOSIS — R7309 Other abnormal glucose: Secondary | ICD-10-CM | POA: Diagnosis not present

## 2021-05-03 DIAGNOSIS — I10 Essential (primary) hypertension: Secondary | ICD-10-CM | POA: Diagnosis not present

## 2021-05-03 DIAGNOSIS — R946 Abnormal results of thyroid function studies: Secondary | ICD-10-CM | POA: Diagnosis not present

## 2021-05-03 MED ORDER — FUROSEMIDE 40 MG PO TABS: 40.0000 mg | ORAL_TABLET | Freq: Every day | ORAL | 3 refills | Status: DC

## 2021-05-03 NOTE — Patient Instructions (Signed)
Medication Instructions:  Your physician has recommended you make the following change in your medication:  INCREASE: furosemide (Lasix) to 40 mg by mouth daily STOP: HCTZ (hydrochlorothiazide)  *If you need a refill on your cardiac medications before your next appointment, please call your pharmacy*   Lab Work: IN 7-10 DAYS: BMP If you have labs (blood work) drawn today and your tests are completely normal, you will receive your results only by: [place] (if you have MyChart) OR A paper copy in the mail If you have any lab test that is abnormal or we need to change your treatment, we will call you to review the results.   Testing/Procedures: Your physician has requested that you have a lexiscan myoview. For further information please visit [email or web address]. Please follow instruction sheet, as given.    Follow-Up: At [facility], you and your health needs are our priority.  As part of our continuing mission to provide you with exceptional heart care, we have created designated Provider Care Teams.  These Care Teams include your primary Cardiologist (physician) and Advanced Practice Providers (APPs -  Physician Assistants and Nurse Practitioners) who all work together to provide you with the care you need, when you need it.   Your next appointment:   3-4 month(s)  The format for your next appointment:   In Person  Provider:   You may see Werner Lean, MD or one of the following Advanced Practice Providers on your designated Care Team:   Melina Copa, PA-C Ermalinda Barrios, PA-C   Other Instructions  You are scheduled for a Myocardial Perfusion Imaging Study. Please arrive 15 minutes prior to your appointment time for registration and insurance purposes.   The test will take approximately 3 to 4 hours to complete; you may bring reading material.  If someone comes with you to your appointment, they will need to remain in the main lobby due to limited space in  the testing area.    How to prepare for your Myocardial Perfusion Test: Do not eat or drink 3 hours prior to your test, except you may have water. Do not consume products containing caffeine (regular or decaffeinated) 12 hours prior to your test. (ex: coffee, chocolate, sodas, tea). Do bring a list of your current medications with you.  If not listed below, you may take your medications as normal. Do wear comfortable clothes (no dresses or overalls) and walking shoes, tennis shoes preferred (No heels or open toe shoes are allowed). Do NOT wear cologne, perfume, aftershave, or lotions (deodorant is allowed). If these instructions are not followed, your test will have to be rescheduled.  If you cannot keep your appointment, please provide 24 hours notification to the Nuclear Lab, to avoid a possible $50 charge to your account.

## 2021-05-08 ENCOUNTER — Telehealth (HOSPITAL_COMMUNITY): Payer: Self-pay | Admitting: *Deleted

## 2021-05-08 NOTE — Telephone Encounter (Signed)
Patient given detailed instructions per Myocardial Perfusion Study Information Sheet for the test on 05/10/21  Patient notified to arrive 15 minutes early and that it is imperative to arrive on time for appointment to keep from having the test rescheduled.  If you need to cancel or reschedule your appointment, please call the office within 24 hours of your appointment. . Patient verbalized understanding. Kirstie Peri

## 2021-05-10 ENCOUNTER — Other Ambulatory Visit: Payer: Medicare Other | Admitting: *Deleted

## 2021-05-10 ENCOUNTER — Ambulatory Visit (HOSPITAL_COMMUNITY): Payer: Medicare Other | Attending: Cardiovascular Disease

## 2021-05-10 ENCOUNTER — Other Ambulatory Visit: Payer: Self-pay

## 2021-05-10 DIAGNOSIS — R072 Precordial pain: Secondary | ICD-10-CM

## 2021-05-10 DIAGNOSIS — R079 Chest pain, unspecified: Secondary | ICD-10-CM

## 2021-05-10 DIAGNOSIS — R0600 Dyspnea, unspecified: Secondary | ICD-10-CM | POA: Diagnosis not present

## 2021-05-10 DIAGNOSIS — R0609 Other forms of dyspnea: Secondary | ICD-10-CM

## 2021-05-10 DIAGNOSIS — I10 Essential (primary) hypertension: Secondary | ICD-10-CM

## 2021-05-10 MED ORDER — REGADENOSON 0.4 MG/5ML IV SOLN
0.4000 mg | Freq: Once | INTRAVENOUS | Status: AC
  Administered 2021-05-10: 0.4 mg via INTRAVENOUS

## 2021-05-10 MED ORDER — TECHNETIUM TC 99M TETROFOSMIN IV KIT
29.4000 | PACK | Freq: Once | INTRAVENOUS | Status: AC | PRN
  Administered 2021-05-10: 29.4 via INTRAVENOUS
  Filled 2021-05-10: qty 30

## 2021-05-11 ENCOUNTER — Ambulatory Visit (HOSPITAL_COMMUNITY): Payer: Medicare Other | Attending: Cardiovascular Disease

## 2021-05-11 LAB — BASIC METABOLIC PANEL
BUN/Creatinine Ratio: 21 (ref 12–28)
BUN: 14 mg/dL (ref 8–27)
CO2: 24 mmol/L (ref 20–29)
Calcium: 8.6 mg/dL — ABNORMAL LOW (ref 8.7–10.3)
Chloride: 106 mmol/L (ref 96–106)
Creatinine, Ser: 0.66 mg/dL (ref 0.57–1.00)
Glucose: 95 mg/dL (ref 65–99)
Potassium: 4.2 mmol/L (ref 3.5–5.2)
Sodium: 139 mmol/L (ref 134–144)
eGFR: 97 mL/min/{1.73_m2} (ref >59)

## 2021-05-11 LAB — MYOCARDIAL PERFUSION IMAGING
LV dias vol: 79 mL (ref 46–106)
LV sys vol: 33 mL
Nuc Stress EF: 58 %
Peak HR: 94 {beats}/min
Rest HR: 66 {beats}/min
Rest Nuclear Isotope Dose: 31.5 mCi
SDS: 11
SRS: 1
SSS: 12
ST Depression (mm): 0 mm
Stress Nuclear Isotope Dose: 29.4 mCi
TID: 1.1

## 2021-05-11 MED ORDER — TECHNETIUM TC 99M TETROFOSMIN IV KIT
31.5000 | PACK | Freq: Once | INTRAVENOUS | Status: AC | PRN
  Administered 2021-05-11: 31.5 via INTRAVENOUS
  Filled 2021-05-11: qty 32

## 2021-05-12 DIAGNOSIS — H524 Presbyopia: Secondary | ICD-10-CM | POA: Diagnosis not present

## 2021-05-15 MED ORDER — AMLODIPINE BESYLATE 5 MG PO TABS
5.0000 mg | ORAL_TABLET | Freq: Every day | ORAL | 3 refills | Status: DC
Start: 2021-05-15 — End: 2021-05-16

## 2021-05-15 NOTE — Telephone Encounter (Signed)
Called pt reviewed MD recommendations.  She verbalizes understanding.  Order placed for amlodipine 5 mg PO QD.

## 2021-05-16 MED ORDER — AMLODIPINE BESYLATE 5 MG PO TABS: 5.0000 mg | ORAL_TABLET | Freq: Every day | ORAL | 3 refills | Status: DC

## 2021-05-18 DIAGNOSIS — E7800 Pure hypercholesterolemia, unspecified: Secondary | ICD-10-CM | POA: Diagnosis not present

## 2021-05-18 DIAGNOSIS — E559 Vitamin D deficiency, unspecified: Secondary | ICD-10-CM | POA: Diagnosis not present

## 2021-05-18 DIAGNOSIS — E039 Hypothyroidism, unspecified: Secondary | ICD-10-CM | POA: Diagnosis not present

## 2021-05-18 DIAGNOSIS — F419 Anxiety disorder, unspecified: Secondary | ICD-10-CM | POA: Diagnosis not present

## 2021-07-24 ENCOUNTER — Institutional Professional Consult (permissible substitution): Payer: Medicare Other | Admitting: Neurology

## 2021-08-04 DIAGNOSIS — H524 Presbyopia: Secondary | ICD-10-CM | POA: Diagnosis not present

## 2021-08-09 DIAGNOSIS — L814 Other melanin hyperpigmentation: Secondary | ICD-10-CM | POA: Diagnosis not present

## 2021-08-09 DIAGNOSIS — L304 Erythema intertrigo: Secondary | ICD-10-CM | POA: Diagnosis not present

## 2021-08-09 DIAGNOSIS — D225 Melanocytic nevi of trunk: Secondary | ICD-10-CM | POA: Diagnosis not present

## 2021-08-09 DIAGNOSIS — D1801 Hemangioma of skin and subcutaneous tissue: Secondary | ICD-10-CM | POA: Diagnosis not present

## 2021-08-09 DIAGNOSIS — L570 Actinic keratosis: Secondary | ICD-10-CM | POA: Diagnosis not present

## 2021-09-01 ENCOUNTER — Ambulatory Visit: Payer: Medicare Other | Admitting: Internal Medicine

## 2021-09-01 ENCOUNTER — Other Ambulatory Visit: Payer: Self-pay

## 2021-09-01 ENCOUNTER — Encounter: Payer: Self-pay | Admitting: Internal Medicine

## 2021-09-01 VITALS — BP 150/78 | HR 66 | Ht 66.0 in | Wt 294.0 lb

## 2021-09-01 DIAGNOSIS — E785 Hyperlipidemia, unspecified: Secondary | ICD-10-CM

## 2021-09-01 DIAGNOSIS — I10 Essential (primary) hypertension: Secondary | ICD-10-CM | POA: Diagnosis not present

## 2021-09-01 MED ORDER — SPIRONOLACTONE 25 MG PO TABS: 12.5000 mg | ORAL_TABLET | Freq: Every day | ORAL | 3 refills | Status: DC

## 2021-09-01 NOTE — Progress Notes (Signed)
Cardiology Office Note:    Date:  09/01/2021   ID:  Jacqueline Mora, DOB Mar 12, 1956, MRN [id number]  PCP:  Wharton, Courtney, [place]  Cardiologist:  Werner Lean, MD  Advanced Practice Provider:  No care team member to display Electrophysiologist:  None      CC: Chest pain f/u  History of Present Illness:    Jacqueline Mora is a 66 y.o. female with a hx of HTN, HLD, who presents for evaluation of chest pain 12/08/20.  In interim of this visit, patient has diastolic dysfunction on echo and started on low dose lasix has had higher dose of losartan. Had negative Lexiscan with a hypertensive response and started amlodipine in interim.  Seen 09/01/21.  Patient notes that she is having issues where she still can't walk when she was taking the amlodipine.  Started taking alpha lipoic acid with some improvement.[name]   Has had improvement in her palpitations but it is still present. There are no interval hospital/ED visit.    No chest pain. But notes when her BP is elevated has chest pressure.  No SOB/DOE and no PND/Orthopnea.  No weight gain or leg swelling.  No palpitations or syncope .   Past Medical History:  Diagnosis Date   Abdominal hernia    Anxiety    BMI 45.0-49.9, adult ([place])    Cancer (HCC)    Colon cancer ([place])    COVID-19    Depression    GERD (gastroesophageal reflux disease)    History of colon polyps    Hypercholesteremia    Hypertension    Hypothyroidism    IBS (irritable bowel syndrome)    Prediabetes    Squamous cell carcinoma in situ (SCCIS) of skin    Thyroid disease    Vitamin D deficiency     Past Surgical History:  Procedure Laterality Date   ABDOMINAL HYSTERECTOMY     CESAREAN SECTION     x 2   colon cancer     COLONOSCOPY     HERNIA REPAIR     Mesh placed then later removed due to infection.   laparostomy      Current Medications: Current Meds  Medication Sig   acetaminophen (TYLENOL) 500 MG  tablet Take 500 mg by mouth every 6 (six) hours as needed.   Alpha-Lipoic Acid 300 MG TABS Take by mouth in the morning and at bedtime.   ALPRAZolam (XANAX) 0.5 MG tablet Take 0.5 mg by mouth as needed for anxiety.   cholecalciferol (VITAMIN D) 1000 units tablet Take 2,000 Units by mouth once a week.    estradiol (ESTRACE) 2 MG tablet Take 2 mg by mouth daily.   FLUoxetine (PROZAC) 10 MG capsule Take 10 mg by mouth daily.   FLUoxetine (PROZAC) 20 MG capsule Take 20 mg by mouth daily.   fluticasone (FLONASE) 50 MCG/ACT nasal spray as needed.   furosemide (LASIX) 40 MG tablet Take 1 tablet (40 mg total) by mouth daily.   ketoconazole (NIZORAL) 2 % cream in the morning and at bedtime.   levothyroxine (SYNTHROID) 150 MCG tablet Take 150 mcg by mouth daily before breakfast.   losartan (COZAAR) 100 MG tablet Take 1 tablet by mouth daily.   RABEprazole (ACIPHEX) 20 MG tablet Take 20 mg by mouth daily.   spironolactone (ALDACTONE) 25 MG tablet Take 0.5 tablets (12.5 mg total) by mouth daily.   Turmeric (QC TUMERIC COMPLEX PO) Take by mouth daily at 6 (  six) AM.     Allergies:   Amlodipine besylate, Clindamycin hcl, Eszopiclone, Penicillin g, Tape, and Zocor [simvastatin]   Social History   Socioeconomic History   Marital status: Widowed    Spouse name: Not on file   Number of children: 2   Years of education: 4 yrs college   Highest education level: Not on file  Occupational History   Occupation: Retired  Tobacco Use   Smoking status: Former    Types: Cigarettes   Smokeless tobacco: Never   Tobacco comments:    Quit 1984  Vaping Use   Vaping Use: Never used  Substance and Sexual Activity   Alcohol use: Yes    Comment: occasionally   Drug use: No   Sexual activity: Not on file  Other Topics Concern   Not on file  Social History Narrative   Lives at home with husband.   Right-handed.   4-5 cups unsweet tea per week.   Social Determinants of Health   Financial Resource Strain:  Not on file  Food Insecurity: Not on file  Transportation Needs: Not on file  Physical Activity: Not on file  Stress: Not on file  Social Connections: Not on file    Social: brother had a 4Vd Bypass  Family History: The patient's family history includes Diabetes in her father; Kidney cancer in her mother. History of coronary artery disease notable for two aunts and brother. History of PAD in father. History of heart failure notable for no members. History of arrhythmia notable for no members.  ROS:   Please see the history of present illness.     All other systems reviewed and are negative.  EKGs/Labs/Other Studies Reviewed:    The following studies were reviewed today:  EKG:   05/03/21: SR rate 70 borderline anterior infarct 12/08/20:  SR rate 73 WNL  Transthoracic Echocardiogram: Date: 01/09/21 Results:  1. Left ventricular ejection fraction, by estimation, is 60 to 65%. The  left ventricle has normal function. The left ventricle has no regional  wall motion abnormalities. There is mild left ventricular hypertrophy.  Left ventricular diastolic parameters  are consistent with Grade II diastolic dysfunction (pseudonormalization).  Elevated left ventricular end-diastolic pressure.   2. Right ventricular systolic function is normal. The right ventricular  size is normal.   3. Left atrial size was mildly dilated.   4. The mitral valve is abnormal. Trivial mitral valve regurgitation. No  evidence of mitral stenosis.   5. The aortic valve was not well visualized. There is mild calcification  of the aortic valve. Aortic valve regurgitation is not visualized. Mild  aortic valve sclerosis is present, with no evidence of aortic valve  stenosis.   6. The inferior vena cava is normal in size with greater than 50%  respiratory variability, suggesting right atrial pressure of 3 mmHg.   Recent Labs: 05/10/2021: BUN 14; Creatinine, Ser 0.66; Potassium 4.2; Sodium 139  Recent Lipid  Panel No results found for: CHOL, TRIG, HDL, CHOLHDL, VLDL, LDLCALC, LDLDIRECT   Physical Exam:    VS:  BP (!) 150/78    Pulse 66    Ht 5\' 6"  (1.676 m)    Wt 133.4 kg    SpO2 98%    BMI 47.45 kg/m     Wt Readings from Last 3 Encounters:  09/01/21 133.4 kg  05/03/21 133.4 kg  12/08/20 132.5 kg     Gen: no distress, morbid obesity  Neck: No JVD Cardiac: No Rubs or Gallops, no  Murmur, regular rhythm +2 radial pulses Respiratory: Clear to auscultation bilaterally, normal effort, normal  respiratory rate GI: Soft, nontender, non-distended  MS: No  edema;  moves all extremities Integument: Skin feels warm Neuro:  At time of evaluation, alert and oriented to person/place/time/situation  Psych: Normal affect, patient feels OK   ASSESSMENT:    1. Hyperlipidemia, unspecified hyperlipidemia type   2. Morbid obesity ([place])   3. Essential hypertension     PLAN:    Morbid Obesity with prior FenFen Use HFpEF - NYHA class I, Stage B, euvolemic, etiology related to obesity HTN HLD Hypothyroidism - without history of  medullary thyroid cancer and MEN2 , no history of pancreatitis - has dizziness and head spinning with norvasc - continue lasix 40 mg PO daily - starting aldactone 12.5 mg PO daily - BMP in 7-10 days - lipids at next blood draw - will send to PharmD clinic for BP assistance and for potentially starting semaglutide  Four months with me          Medication Adjustments/Labs and Tests Ordered: Current medicines are reviewed at length with the patient today.  Concerns regarding medicines are outlined above.  Orders Placed This Encounter  Procedures   Basic metabolic panel   Lipid panel   AMB Referral to [facility] Pharm[facility]    Meds ordered this encounter  Medications   spironolactone (ALDACTONE) 25 MG tablet    Sig: Take 0.5 tablets (12.5 mg total) by mouth daily.    Dispense:  45 tablet    Refill:  3     Patient Instructions  Medication Instructions:   Your physician has recommended you make the following change in your medication:  START ALDACTONE 12.5 MG DAILY   *If you need a refill on your cardiac medications before your next appointment, please call your pharmacy*   Lab Work: TO BE DONE IN 7-10 DAYS: BMET, FASTING LIPIDS If you have labs (blood work) drawn today and your tests are completely normal, you will receive your results only by: [place] (if you have MyChart) OR A paper copy in the mail If you have any lab test that is abnormal or we need to change your treatment, we will call you to review the results.   Testing/Procedures: NONE   Follow-Up: At [facility], you and your health needs are our priority.  As part of our continuing mission to provide you with exceptional heart care, we have created designated Provider Care Teams.  These Care Teams include your primary Cardiologist (physician) and Advanced Practice Providers (APPs -  Physician Assistants and Nurse Practitioners) who all work together to provide you with the care you need, when you need it.  We recommend signing up for the patient portal called "MyChart".  Sign up information is provided on this After Visit Summary.  MyChart is used to connect with patients for Virtual Visits (Telemedicine).  Patients are able to view lab/test results, encounter notes, upcoming appointments, etc.  Non-urgent messages can be sent to your provider as well.   To learn more about what you can do with MyChart, go to [email or web address].    Your next appointment:   4 month(s)  The format for your next appointment:   In Person  Provider:   Werner Lean, MD {   Other Instructions YOU ARE BEING REFERRED TO SEE A PHARMACIST      Signed, Werner Lean, MD  09/01/2021 2:59 PM    [place]

## 2021-09-01 NOTE — Patient Instructions (Signed)
Medication Instructions:  Your physician has recommended you make the following change in your medication:  START ALDACTONE 12.5 MG DAILY   *If you need a refill on your cardiac medications before your next appointment, please call your pharmacy*   Lab Work: TO BE DONE IN 7-10 DAYS: BMET, FASTING LIPIDS If you have labs (blood work) drawn today and your tests are completely normal, you will receive your results only by: [place] (if you have MyChart) OR A paper copy in the mail If you have any lab test that is abnormal or we need to change your treatment, we will call you to review the results.   Testing/Procedures: NONE   Follow-Up: At [facility], you and your health needs are our priority.  As part of our continuing mission to provide you with exceptional heart care, we have created designated Provider Care Teams.  These Care Teams include your primary Cardiologist (physician) and Advanced Practice Providers (APPs -  Physician Assistants and Nurse Practitioners) who all work together to provide you with the care you need, when you need it.  We recommend signing up for the patient portal called "MyChart".  Sign up information is provided on this After Visit Summary.  MyChart is used to connect with patients for Virtual Visits (Telemedicine).  Patients are able to view lab/test results, encounter notes, upcoming appointments, etc.  Non-urgent messages can be sent to your provider as well.   To learn more about what you can do with MyChart, go to [email or web address].    Your next appointment:   4 month(s)  The format for your next appointment:   In Person  Provider:   Werner Lean, MD {   Other Instructions YOU ARE BEING REFERRED TO SEE A PHARMACIST

## 2021-09-05 ENCOUNTER — Other Ambulatory Visit: Payer: Self-pay

## 2021-09-05 MED ORDER — SPIRONOLACTONE 25 MG PO TABS: 12.5000 mg | ORAL_TABLET | Freq: Every day | ORAL | 0 refills | Status: DC

## 2021-09-06 ENCOUNTER — Ambulatory Visit (INDEPENDENT_AMBULATORY_CARE_PROVIDER_SITE_OTHER): Payer: Medicare Other

## 2021-09-06 ENCOUNTER — Other Ambulatory Visit: Payer: Self-pay

## 2021-09-06 ENCOUNTER — Ambulatory Visit: Payer: Medicare Other | Admitting: Podiatry

## 2021-09-06 DIAGNOSIS — M7661 Achilles tendinitis, right leg: Secondary | ICD-10-CM

## 2021-09-06 DIAGNOSIS — M85671 Other cyst of bone, right ankle and foot: Secondary | ICD-10-CM

## 2021-09-07 ENCOUNTER — Encounter: Payer: Self-pay | Admitting: Podiatry

## 2021-09-07 NOTE — Progress Notes (Signed)
Subjective:  Patient ID: Jacqueline Mora, female    DOB: [birth year],  MRN: [id number]  Chief Complaint  Patient presents with   Cyst    66 y.o. female presents with the above complaint.  Patient presents with right posterior heel spur/Achilles tendinitis.  Patient has been going for a month as progressive gotten worse is getting painful is getting bigger and is hurting with every step that she takes.  She states she does ambulate and stand on her foot a lot.  She has not seen anyone else prior to seeing me.  She denies any other acute complaints.  She would like to discuss treatment options.  She has tried some over-the-counter medications and treatments none of which has helped.   Review of Systems: Negative except as noted in the HPI. Denies N/V/F/Ch.  Past Medical History:  Diagnosis Date   Abdominal hernia    Anxiety    BMI 45.0-49.9, adult (HCC)    Cancer (HCC)    Colon cancer ([place])    COVID-19    Depression    GERD (gastroesophageal reflux disease)    History of colon polyps    Hypercholesteremia    Hypertension    Hypothyroidism    IBS (irritable bowel syndrome)    Prediabetes    Squamous cell carcinoma in situ (SCCIS) of skin    Thyroid disease    Vitamin D deficiency     Current Outpatient Medications:    acetaminophen (TYLENOL) 500 MG tablet, Take 500 mg by mouth every 6 (six) hours as needed., Disp: , Rfl:    Alpha-Lipoic Acid 300 MG TABS, Take by mouth in the morning and at bedtime., Disp: , Rfl:    ALPRAZolam (XANAX) 0.5 MG tablet, Take 0.5 mg by mouth as needed for anxiety., Disp: , Rfl:    cholecalciferol (VITAMIN D) 1000 units tablet, Take 2,000 Units by mouth once a week. , Disp: , Rfl:    estradiol (ESTRACE) 2 MG tablet, Take 2 mg by mouth daily., Disp: , Rfl:    FLUoxetine (PROZAC) 10 MG capsule, Take 10 mg by mouth daily., Disp: , Rfl:    FLUoxetine (PROZAC) 20 MG capsule, Take 20 mg by mouth daily., Disp: , Rfl:    fluticasone (FLONASE) 50 MCG/ACT  nasal spray, as needed., Disp: , Rfl: 5   furosemide (LASIX) 40 MG tablet, Take 1 tablet (40 mg total) by mouth daily., Disp: 90 tablet, Rfl: 3   ketoconazole (NIZORAL) 2 % cream, in the morning and at bedtime., Disp: , Rfl:    levothyroxine (SYNTHROID) 150 MCG tablet, Take 150 mcg by mouth daily before breakfast., Disp: , Rfl:    losartan (COZAAR) 100 MG tablet, Take 1 tablet by mouth daily., Disp: , Rfl:    RABEprazole (ACIPHEX) 20 MG tablet, Take 20 mg by mouth daily., Disp: , Rfl:    spironolactone (ALDACTONE) 25 MG tablet, Take 0.5 tablets (12.5 mg total) by mouth daily., Disp: 8 tablet, Rfl: 0   Turmeric (QC TUMERIC COMPLEX PO), Take by mouth daily at 6 (six) AM., Disp: , Rfl:   Social History   Tobacco Use  Smoking Status Former   Types: Cigarettes  Smokeless Tobacco Never  Tobacco Comments   Quit 1984    Allergies  Allergen Reactions   Amlodipine Besylate Other (See Comments)    Dizziness   Clindamycin Hcl     Other reaction(s): rash   Eszopiclone     Other reaction(s): Crazy dreams   Penicillin G  Other reaction(s): rash   Tape     Adhesive on medical tape - requires paper tape.   Zocor [Simvastatin]    Objective:  There were no vitals filed for this visit. There is no height or weight on file to calculate BMI. Constitutional Well developed. Well nourished.  Vascular Dorsalis pedis pulses palpable bilaterally. Posterior tibial pulses palpable bilaterally. Capillary refill normal to all digits.  No cyanosis or clubbing noted. Pedal hair growth normal.  Neurologic Normal speech. Oriented to person, place, and time. Epicritic sensation to light touch grossly present bilaterally.  Dermatologic Nails well groomed and normal in appearance. No open wounds. No skin lesions.  Orthopedic: Pain on palpation of right Achilles tendon insertional pain.  Posterior heel spurring is with/Haglund's deformity clinically appreciated.  Positive Silfverskiold test with  gastrocnemius equinus noted.   Radiographs: 3 views of skeletally mature the right foot: Posterior heel spurring noted plantar heel spurring noted pes planovalgus foot structure noted.  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  No other bony abnormalities identified. Assessment:   1. Achilles tendinitis, right leg    Plan:  Patient was evaluated and treated and all questions answered.  Right Achilles tendinitis -All questions or concerns were discussed with the patient in extensive detail -I discussed with her given the amount of pain that she is having I believe she will benefit from a cam boot immobilization to allow the soft tissue pain from inflammation to calm down.  Once it has come down and if there is still any residual pain patient would benefit from a steroid injection.  I discussed these with the patient in extensive detail she states understanding.  Would like to proceed with cam boot immobilization -Cam boot was dispensed -If no improvement we will discuss further imaging versus steroid injection.  Return in about 4 weeks (around 10/04/2021).

## 2021-09-08 ENCOUNTER — Other Ambulatory Visit: Payer: Medicare Other

## 2021-09-13 ENCOUNTER — Other Ambulatory Visit: Payer: Self-pay

## 2021-09-13 ENCOUNTER — Other Ambulatory Visit: Payer: Medicare Other | Admitting: *Deleted

## 2021-09-13 DIAGNOSIS — E785 Hyperlipidemia, unspecified: Secondary | ICD-10-CM | POA: Diagnosis not present

## 2021-09-13 DIAGNOSIS — I10 Essential (primary) hypertension: Secondary | ICD-10-CM

## 2021-09-13 LAB — BASIC METABOLIC PANEL
BUN/Creatinine Ratio: 17 (ref 12–28)
BUN: 14 mg/dL (ref 8–27)
CO2: 27 mmol/L (ref 20–29)
Calcium: 8.9 mg/dL (ref 8.7–10.3)
Chloride: 101 mmol/L (ref 96–106)
Creatinine, Ser: 0.83 mg/dL (ref 0.57–1.00)
Glucose: 117 mg/dL — ABNORMAL HIGH (ref 70–99)
Potassium: 4.5 mmol/L (ref 3.5–5.2)
Sodium: 136 mmol/L (ref 134–144)
eGFR: 78 mL/min/{1.73_m2} (ref >59)

## 2021-09-13 LAB — LIPID PANEL
Chol/HDL Ratio: 4.5 ratio — ABNORMAL HIGH (ref 0.0–4.4)
Cholesterol, Total: 226 mg/dL — ABNORMAL HIGH (ref 100–199)
HDL: 50 mg/dL (ref >39)
LDL Chol Calc (NIH): 135 mg/dL — ABNORMAL HIGH (ref 0–99)
Triglycerides: 229 mg/dL — ABNORMAL HIGH (ref 0–149)
VLDL Cholesterol Cal: 41 mg/dL — ABNORMAL HIGH (ref 5–40)

## 2021-09-15 ENCOUNTER — Telehealth: Payer: Self-pay

## 2021-09-15 MED ORDER — EZETIMIBE 10 MG PO TABS: 10.0000 mg | ORAL_TABLET | Freq: Every day | ORAL | 3 refills | Status: DC

## 2021-09-15 NOTE — Telephone Encounter (Signed)
-----   Message from Werner Lean, MD sent at 09/15/2021  2:33 PM EST ----- Results: BMP WNL LDL elevated Plan: Zetia 10 mg PO daily and labs at next visit  Werner Lean, MD

## 2021-09-15 NOTE — Telephone Encounter (Signed)
The patient has been notified of the result and verbalized understanding.  All questions (if any) were answered. Antonieta Iba, RN 09/15/2021 2:49 PM  Rx has been sent in for zetia 10 mg daily.  Advised patient to come fasting to her follow up appointment.  Patient also reports that she went to the dentist and they found a calcification and started her on doxycycline 100 mg daily for 3 months. I have added this to the patient's chart.

## 2021-09-21 ENCOUNTER — Ambulatory Visit: Payer: Medicare Other | Admitting: Pharmacist

## 2021-09-21 ENCOUNTER — Other Ambulatory Visit: Payer: Self-pay

## 2021-09-21 VITALS — BP 142/78 | HR 70 | Wt 294.0 lb

## 2021-09-21 DIAGNOSIS — I10 Essential (primary) hypertension: Secondary | ICD-10-CM

## 2021-09-21 MED ORDER — SPIRONOLACTONE 25 MG PO TABS: 25.0000 mg | ORAL_TABLET | Freq: Every day | ORAL | 3 refills | Status: DC

## 2021-09-21 NOTE — Patient Instructions (Addendum)
Please keep checking your blood pressure at home. Please bring in a list of readings with you to your next visit along with your blood pressure cuff.  Please increase spironolactone to 25mg  daily. Continue losartan 100mg  daily and furosemide 40mg  daily  Call me at [phone] with any questions  Hypertension "High blood pressure"  Hypertension is often called The Silent Killer. It rarely causes symptoms until it is extremely  high or has done damage to other organs in the body. For this reason, you should have your  blood pressure checked regularly by your physician. We will check your blood pressure  every time you see a provider at one of our offices.   Your blood pressure reading consists of two numbers. Ideally, blood pressure should be  below 120/80. The first (top) number is called the systolic pressure. It measures the  pressure in your arteries as your heart beats. The second (bottom) number is called the diastolic pressure. It measures the pressure in your arteries as the heart relaxes between beats.  The benefits of getting your blood pressure under control are enormous. A 10-point  reduction in systolic blood pressure can reduce your risk of stroke by 27% and heart failure by 28%  Your blood pressure goal is <130/80  To check your pressure at home you will need to:  1. Sit up in a chair, with feet flat on the floor and back supported. Do not cross your ankles or legs. 2. Rest your left arm so that the cuff is about heart level. If the cuff goes on your upper arm,  then just relax the arm on the table, arm of the chair or your lap. If you have a wrist cuff, we  suggest relaxing your wrist against your chest (think of it as Pledging the Flag with the  wrong arm).  3. Place the cuff snugly around your arm, about 1 inch above the crook of your elbow. The  cords should be inside the groove of your elbow.  4. Sit quietly, with the cuff in place, for about 5 minutes.  After that 5 minutes press the power  button to start a reading. 5. Do not talk or move while the reading is taking place.  6. Record your readings on a sheet of paper. Although most cuffs have a memory, it is often  easier to see a pattern developing when the numbers are all in front of you.  7. You can repeat the reading after 1-3 minutes if it is recommended  Make sure your bladder is empty and you have not had caffeine or tobacco within the last 30 min  Always bring your blood pressure log with you to your appointments. If you have not brought your monitor in to be double checked for accuracy, please bring it to your next appointment.  You can find a list of validated (accurate) blood pressure cuffs at PopPath.it  Healthy Diet  SALT  What is the big deal with sodium? Why the need to limit our intake? What is the connection to  blood pressure? And what is the difference between salt and sodium? Sodium attracts water. Think about it. When you eat an overly salty snack, you tend to become  thirsty and need more water. If you have too much sodium in your bloodstream, your body will  then pull water into the bloodstream as well, trying to correct the imbalance. When you have  more volume in the bloodstream, your blood pressure goes up. Your  heart must work harder to  pump the extra volume, and the increase in pressure can wear out blood vessels faster. You may  also notice bloating and weight gain. Hypertension is one of the leading risk factors for heart  disease. By limiting sodium intake throughout life you are helping decrease your risk of heart  disease later on.   Salt is made up of two minerals. Sodium and chloride. A teaspoon of salt contains about 40%  sodium and 60% chloride. One teaspoon of salt has 2,300 mg of sodium. While the current  USDA guideline states you should consume no more than 2,300 mg per day, both they and the  American Heart Association recommend that you  limit this to 1,500 mg to stay healthy. Sea salt  or [place] pink salt may have a slightly different taste, but they still have almost the same  percent of sodium per teaspoon. So feel free to use them instead of table salt, but dont use  more.  A common myth is that if you dont add salt to your food, you are following a low sodium diet.  However, 75% of the sodium consumed in the American diet is from processed foods, NOT the  salt shaker. We all know that chips and crackers are high in sodium, but there are many other  foods that we may not think of when limiting our sodium. Below are the salty six foods that  the American Heart Association wants you to be aware of. 1. Cold cuts - even the healthy sliced [place] can have over 1,000 mg of sodium per slice.   Compare different brands to see which has less sodium if you eat these regularly 2. Pizza - depending on your toppings, a slice of pizza can have up to 760 mg of   sodium. Put more veggies on it or just have a slice with a side salad and still enjoy. 3. Soup - yes even that old home remedy of chicken soup is loaded with sodium. Look   for low sodium versions. Or add a bunch of frozen veggies when heating it, this will   give you less sodium per serving 4. Breads - they may not taste salty, but a single slice of bread can have up to 230 mg of   sodium. Toast for breakfast, a sandwich at lunch and a dinner roll can quickly add up   to over 900 mg in just one day.  5. Chicken - some fresh or frozen chicken is injected with a sodium solution before it   reaches the store. A 4 oz serving should have no more than 100 mg sodium. And   watch for breaded frozen chicken nuggets, strips and tenders. They may seem like a   quick and easy healthy meal, but they have high amounts of sodium as well. 6. Burritos/tacos - just 2 teaspoons of taco seasoning can have over 400 mg sodium.   Try making your own with equal parts of cumin, oregano, chili  powder and garlic powder.   SUGAR  Sugar is a huge problem in the modern day diet. Sugar is a HUGE contributor to heart disease, diabetes, high triglyceride levels, fatty liver diease and obesity. Sugar is hidden in almost all packaged foods/beverages. It adds no nutritional benefit to your body and can cause major harm. Added sugar is extra sugar that is added beyond what is naturally found. The American Heart Association recommends limiting added sugars to no more than 25g  for women and 36 grams for men per day.  There are many names for sugar maltose, sucrose (names ending in "ose"), high fructose corn syrup, molasses, cane sugar, corn sweetener, raw sugar, syrup, honey or fruit juice concentrate.   One of the best ways to limit your added sugars is to stop drinking sweetened beverages such as soda, sweet tea, fruit juice or fancy coffee's. There is 65g of added sugars in one 20oz bottle of Coke!! That is equal to 6 donuts.   Pay attention and read all nutrition facts labels. Below is an examples of a nutrition facts label. The #1 is showing you the total sugars where the # 2 is showing you the added sugars. This one severing has almost the max amount of added sugars per day!  Watch out for items that say "low fat" or "no added sugar" as these products are typically very high in sugar. The food industry uses these terms to fool you into thinking they are healthy.  For more information on the dangers of sugar watch WHY Sugar is as Bad as Alcohol (Fructose, The Liver Toxin) on YouTube.    EXERCISE  Exercise can help lower your blood pressure ~5 points systolic (top #) and 8 points diastolic (bottom #)  Exercise is good. Weve all heard that. In an ideal world, we would all have time and resources to  get plenty of it. When you are active your heart pumps more efficiently and you will feel better.  Multiple studies show that even walking regularly has benefits that include living a longer  life.  The American Heart Association recommends 90-150 minutes per week of exercise (30 minutes  per day most days of the week). You can do this in any increment you wish. Nine or more  10-minute walks count. So does an hour-long exercise class. Break the time apart into what will  work in your life. Some of the best things you can do include walking briskly, jogging, cycling or  swimming laps. Not everyone is ready to exercise. Sometimes we need to start with just getting active. Here  are some easy ways to be more active throughout the day:  Take the stairs instead of the elevator  Go for a 10-15 minute walk during your lunch break (find a friend to make it more enjoyable)  When shopping, park at the back of the parking lot  If you take public transportation, get off one stop early and walk the extra distance  Pace around while making phone calls (most of [place] are not attached to phone cords any longer!) Check with your doctor if you arent sure what your limitations may be. Always remember to drink plenty of water when doing any type of exercise. Dont feel like a failure if youre not getting the 90-150 minutes per week. If you started by being  a couch potato, then just a 10-minute walk each day is a huge improvement. Start with little  victories and work your way up.   Healthy Eating Tips  When looking to improve your eating habits, whether to lose weight, lower blood pressure or just be healthier, it helps to know what a serving size is.   Grains 1 slice of bread,  bagel,  cup pasta or rice  Vegetables 1 cup fresh or raw vegetables,  cup cooked or canned Fruits 1 piece of medium sized fruit,  cup canned,   Meats/Proteins  cup dried       1 oz  meat, 1 egg,  cup cooked beans, nuts or seeds  Dairy        Fats Individual yogurt container, 1 cup (8oz)    1 teaspoon margarine/butter or vegetable  milk or milk alternative, 1 slice of cheese          oil; 1 tablespoon  mayonnaise or salad dressing                  Plan ahead: make a menu of the meals for a week then create a grocery list to go with  that menu. Consider meals that easily stretch into a night of leftovers, such as stews or  casseroles. Or consider making two of your favorite meal and put one in the freezer or fridge for  another night.  When you get home from the grocery store wash and prepare your vegetables and fruits.  Then when you need them they are ready to go.  Tips for going to the grocery store:  [employer] store or generic brands  Check the weekly ad from your store on-line or in their in-store flyer  Look at the unit price on the shelf tag to compare/contrast the costs of different items  Buy fruits/vegetables in season  Carrots, bananas and apples are low-cost, naturally healthy items  If meats or frozen vegetables are on sale, buy some extras and put in your freezer  Limit buying prepared or ready to eat items, even if they are pre-made salads or fruit snacks  Do not shop when youre hungry  Foods at eye level tend to be more expensive. Look on the high and low shelves for deals.  Consider shopping at the farmers market for fresh foods in season.  Choose canned tuna or salmon instead of fresh  Avoid the cookie and chip aisles (these are expensive, high in calories and low in  nutritional value) Healthy food preparations:  If you cant get lean hamburger, be sure to drain the fat when cooking  Steam, saut (in olive oil), grill or bake foods  Experiment with different seasonings to avoid adding salt to your foods. Kosher salt, sea salt and [place] salt are all still salt and should be avoided          Aim to have one 12 hour fast each day. This means no eating after dinner until breakfast. For example, if you eat dinner around 6 PM then you would not eat anything until 6 AM the next day. This is a great way to help lower your insulin levels, lose weight and reduce your blood  pressure.  Resources: American Heart Association - InstantFinish.fi Go to the Healthy Living tab to get more information American Diabetes Association - www.diabetes.org You dont have to be diabetic - check out the Food and Fitness tab  DASH diet - https://wilson-eaton.com/ Health topics - or just search on their home page for DASH Quit for Life - www.cancer.org Follow the Stay Healthy tab to learn more about smoking cessatio

## 2021-09-21 NOTE — Progress Notes (Signed)
Patient ID: Jacqueline Mora                 DOB: [birth year]                      MRN: [id number]     HPI: Jacqueline Mora is a 66 y.o. female referred by Dr. Gasper Sells to HTN clinic. PMH is significant for HTN, HLD and diastolic dysfunction. Patient had a negative lexiscan with hypertensive response. At last visit, spironolactone 12.5mg  daily was started.  Patient presents today to clinic. She reports that her BP has been in the 140's/80's. She has gained about 50lb in the past year or so. Husband passed away and she started eating sweets again. Has struggled with her weight all her life, but this is the biggest she has ever been. Her insurance (medicare) will not pay for wegovy or ozmepic (at one point was pre-diabetic and has some impaired fasting glucose, but not DM). Considering paying cash for it. Feels like she lost all her strength, especially in her legs. Is limited in exercise due to her abdominal hernias and now bone spur in her foot. Has some dizziness (not new). No swelling.   Current HTN meds: spironolactone 12.5mg  daily, losartan 100mg  daily, furosemide 40mg  daily Previously tried: amlodipine (issues walking) BP goal: <130/80  Family History: The patient's family history includes Diabetes in her father; Kidney cancer in her mother. History of coronary artery disease notable for two aunts and brother. History of PAD in father. History of heart failure notable for no members. History of arrhythmia notable for no members.  Social History: ETOH only on vacation  Diet: breakfast eggs and toast Lunch: sandwich- [place], big salad, salmon Dinner: low carb meals from [employer] Stopped the sweets Drinks mostly water, hot tea in the AM w/ stevia  Exercise: trying to do chair exercises  Home BP readings: 140's/80's  Wt Readings from Last 3 Encounters:  09/01/21 294 lb (133.4 kg)  05/03/21 294 lb (133.4 kg)  12/08/20 292 lb (132.5 kg)   BP Readings from Last 3 Encounters:   09/01/21 (!) 150/78  05/03/21 120/80  12/08/20 140/70   Pulse Readings from Last 3 Encounters:  09/01/21 66  05/03/21 67  12/08/20 73    Renal function: CrCl cannot be calculated (Unknown ideal weight.).  Past Medical History:  Diagnosis Date   Abdominal hernia    Anxiety    BMI 45.0-49.9, adult (HCC)    Cancer (HCC)    Colon cancer ([place])    COVID-19    Depression    GERD (gastroesophageal reflux disease)    History of colon polyps    Hypercholesteremia    Hypertension    Hypothyroidism    IBS (irritable bowel syndrome)    Prediabetes    Squamous cell carcinoma in situ (SCCIS) of skin    Thyroid disease    Vitamin D deficiency     Current Outpatient Medications on File Prior to Visit  Medication Sig Dispense Refill   acetaminophen (TYLENOL) 500 MG tablet Take 500 mg by mouth every 6 (six) hours as needed.     Alpha-Lipoic Acid 300 MG TABS Take by mouth in the morning and at bedtime.     ALPRAZolam (XANAX) 0.5 MG tablet Take 0.5 mg by mouth as needed for anxiety.     cholecalciferol (VITAMIN D) 1000 units tablet Take 2,000 Units by mouth once a week.      doxycycline (VIBRAMYCIN) 100 MG capsule  Take 100 mg by mouth daily.     estradiol (ESTRACE) 2 MG tablet Take 2 mg by mouth daily.     ezetimibe (ZETIA) 10 MG tablet Take 1 tablet (10 mg total) by mouth daily. 90 tablet 3   FLUoxetine (PROZAC) 10 MG capsule Take 10 mg by mouth daily.     FLUoxetine (PROZAC) 20 MG capsule Take 20 mg by mouth daily.     fluticasone (FLONASE) 50 MCG/ACT nasal spray as needed.  5   furosemide (LASIX) 40 MG tablet Take 1 tablet (40 mg total) by mouth daily. 90 tablet 3   ketoconazole (NIZORAL) 2 % cream in the morning and at bedtime.     levothyroxine (SYNTHROID) 150 MCG tablet Take 150 mcg by mouth daily before breakfast.     losartan (COZAAR) 100 MG tablet Take 1 tablet by mouth daily.     RABEprazole (ACIPHEX) 20 MG tablet Take 20 mg by mouth daily.     spironolactone (ALDACTONE) 25  MG tablet Take 0.5 tablets (12.5 mg total) by mouth daily. 8 tablet 0   Turmeric (QC TUMERIC COMPLEX PO) Take by mouth daily at 6 (six) AM.     No current facility-administered medications on file prior to visit.    Allergies  Allergen Reactions   Amlodipine Besylate Other (See Comments)    Dizziness   Clindamycin Hcl     Other reaction(s): rash   Eszopiclone     Other reaction(s): Crazy dreams   Penicillin G     Other reaction(s): rash   Tape     Adhesive on medical tape - requires paper tape.   Zocor [Simvastatin]     There were no vitals taken for this visit.   Assessment/Plan:  1. Hypertension - Blood pressure is above goal of <130/80. Will increase spironolactone to 25mg  daily. Continue losartan 100mg  daily and furosemide 40mg  daily. Follow up in clinic in 3 weeks.  2. Obesity- Was able to sample patient with Saxenda- this will at least get her titrated to the max dose plus some. At some point when Saxenda samples run out, if patient is willing to pay cash, she can be transitioned to [facility]. This is not something I typically do, but we did acquire 12 pens of Saxenda that we would not use on any other patient. Confirmed no hx of medullary thyroid cancer and MEN2 , no history of pancreatitis. Injection technique taught. Titration schedule reviewed and written down for patient. I recommended a personal trainer for her that I know would be able to taylor exercises for her with her limitations. Patient appreciative of the recommendation. She requested that I provide her phone number to the trainer which I did.   Thank you  Ramond Dial, Pharm.D, BCPS, CPP [place]  [address] [place], [place] [address]  Phone: [phone]; Fax: [phone]

## 2021-09-22 ENCOUNTER — Encounter: Payer: Self-pay | Admitting: Pharmacist

## 2021-09-29 ENCOUNTER — Encounter: Payer: Self-pay | Admitting: Internal Medicine

## 2021-10-11 ENCOUNTER — Ambulatory Visit: Payer: Medicare Other | Admitting: Podiatry

## 2021-10-12 ENCOUNTER — Institutional Professional Consult (permissible substitution): Payer: Medicare Other | Admitting: Neurology

## 2021-10-13 ENCOUNTER — Ambulatory Visit: Payer: Medicare Other | Admitting: Podiatry

## 2021-10-13 ENCOUNTER — Other Ambulatory Visit: Payer: Self-pay

## 2021-10-13 DIAGNOSIS — M7661 Achilles tendinitis, right leg: Secondary | ICD-10-CM

## 2021-10-13 DIAGNOSIS — M9261 Juvenile osteochondrosis of tarsus, right ankle: Secondary | ICD-10-CM | POA: Diagnosis not present

## 2021-10-16 ENCOUNTER — Ambulatory Visit: Payer: Medicare Other | Admitting: Pharmacist

## 2021-10-16 ENCOUNTER — Other Ambulatory Visit: Payer: Self-pay

## 2021-10-16 VITALS — BP 130/72 | HR 73 | Wt 286.8 lb

## 2021-10-16 DIAGNOSIS — I10 Essential (primary) hypertension: Secondary | ICD-10-CM | POA: Diagnosis not present

## 2021-10-16 NOTE — Progress Notes (Signed)
Patient ID: ZIMAL WEISENSEL                 DOB: 12-20-1955                      MRN: [id number]     HPI: Jacqueline Mora is a 66 y.o. female referred by Dr. Gasper Sells to HTN clinic. PMH is significant for HTN, HLD and diastolic dysfunction. Patient had a negative lexiscan with hypertensive response. At last visit, spironolactone was increased to 25mg  daily. She also started on Saxenda via samples.  Patient presents today to clinic for follow up. She states that she has been feeling more dizzy since increasing spironolactone to 25mg  daily. States she stopped taking for a few days and felt better. She has no swelling or SOB. Reports blood pressures 130-143/70-85. Thinks she has a low one but doesn't know what it was. Did not bring her BP cuff.  Is titrating Saxenda. Has lost 8 lb. Wishes her insurance would cover medication. Considering paying cash for it. Feels like she lost all her strength, especially in her legs. Is limited in exercise due to her abdominal hernias and now bone spur in her foot. Doesn't feel hungry. Is focusing on eating enough protein. She is going to the beach for the month of March. She plans to start with trainer in April.   Current HTN meds: spironolactone 25mg  daily, losartan 100mg  daily, furosemide 40mg  daily Previously tried: amlodipine (issues walking) BP goal: <130/80  Family History: The patient's family history includes Diabetes in her father; Kidney cancer in her mother. History of coronary artery disease notable for two aunts and brother. History of PAD in father. History of heart failure notable for no members. History of arrhythmia notable for no members.  Social History: ETOH only on vacation  Diet: breakfast eggs and toast Lunch: sandwich- [place], big salad, salmon Dinner: low carb meals from [employer] Stopped the sweets Drinks mostly water, hot tea in the AM w/ stevia  Exercise: trying to do chair exercises  Home BP readings: 130-143/70-85  Wt  Readings from Last 3 Encounters:  09/21/21 294 lb (133.4 kg)  09/01/21 294 lb (133.4 kg)  05/03/21 294 lb (133.4 kg)   BP Readings from Last 3 Encounters:  09/21/21 (!) 142/78  09/01/21 (!) 150/78  05/03/21 120/80   Pulse Readings from Last 3 Encounters:  09/21/21 70  09/01/21 66  05/03/21 67    Renal function: CrCl cannot be calculated (Patient's most recent lab result is older than the maximum 21 days allowed.).  Past Medical History:  Diagnosis Date   Abdominal hernia    Anxiety    BMI 45.0-49.9, adult (HCC)    Cancer (HCC)    Colon cancer ([facility])    COVID-19    Depression    GERD (gastroesophageal reflux disease)    History of colon polyps    Hypercholesteremia    Hypertension    Hypothyroidism    IBS (irritable bowel syndrome)    Prediabetes    Squamous cell carcinoma in situ (SCCIS) of skin    Thyroid disease    Vitamin D deficiency     Current Outpatient Medications on File Prior to Visit  Medication Sig Dispense Refill   acetaminophen (TYLENOL) 500 MG tablet Take 500 mg by mouth every 6 (six) hours as needed.     Alpha-Lipoic Acid 300 MG TABS Take by mouth in the morning and at bedtime.     ALPRAZolam Duanne Moron)  0.5 MG tablet Take 0.5 mg by mouth as needed for anxiety.     cholecalciferol (VITAMIN D) 1000 units tablet Take 2,000 Units by mouth once a week.      doxycycline (VIBRAMYCIN) 100 MG capsule Take 100 mg by mouth daily.     estradiol (ESTRACE) 2 MG tablet Take 2 mg by mouth daily.     ezetimibe (ZETIA) 10 MG tablet Take 1 tablet (10 mg total) by mouth daily. 90 tablet 3   FLUoxetine (PROZAC) 10 MG capsule Take 10 mg by mouth daily.     FLUoxetine (PROZAC) 20 MG capsule Take 20 mg by mouth daily.     fluticasone (FLONASE) 50 MCG/ACT nasal spray as needed.  5   furosemide (LASIX) 40 MG tablet Take 1 tablet (40 mg total) by mouth daily. 90 tablet 3   ketoconazole (NIZORAL) 2 % cream in the morning and at bedtime.     levothyroxine (SYNTHROID) 150 MCG  tablet Take 150 mcg by mouth daily before breakfast.     losartan (COZAAR) 100 MG tablet Take 1 tablet by mouth daily.     RABEprazole (ACIPHEX) 20 MG tablet Take 20 mg by mouth daily.     spironolactone (ALDACTONE) 25 MG tablet Take 1 tablet (25 mg total) by mouth daily. 90 tablet 3   Turmeric (QC TUMERIC COMPLEX PO) Take by mouth daily at 6 (six) AM.     No current facility-administered medications on file prior to visit.    Allergies  Allergen Reactions   Amlodipine Besylate Other (See Comments)    Dizziness   Clindamycin Hcl     Other reaction(s): rash   Eszopiclone     Other reaction(s): Crazy dreams   Penicillin G     Other reaction(s): rash   Tape     Adhesive on medical tape - requires paper tape.   Zocor [Simvastatin]     There were no vitals taken for this visit.   Assessment/Plan:  1. Hypertension - Blood pressure is at goal of <130/80 in clinic. Close to goal at home. Home cuff has not been calibrated. Will try decreasing furosemide to 20mg  see if that helps with dizziness. Checking BMP today. Will continue spironolactone to 25mg  daily. Continue losartan 100mg  daily. If this does not help with dizziness, then will consider decreasing spiron to 12.5mg  and changing losartan to irbesartan. Follow up in clinic when pt returns to town in April.  2. Obesity- Was able to sample patient with Saxenda- this will at least get her titrated to the max dose plus some. At some point when Saxenda samples run out, if patient is willing to pay cash, she can be transitioned to [facility]. This is not something I typically do, but we did acquire 12 pens of Saxenda that we would not use on any other patient. She has lost 8lb so far.  Thank you  Ramond Dial, Pharm.D, BCPS, CPP [place]  [address], [place], [place] [address]  Phone: [phone]; Fax: [phone]

## 2021-10-16 NOTE — Patient Instructions (Addendum)
Please decrease furosemide to 20mg  daily Continue spironolactone 25mg  daily Continue losartan 100mg  daily  Please let me know if there is no improvement in your dizziness Please bring your blood pressure cuff to next visit

## 2021-10-17 LAB — BASIC METABOLIC PANEL
BUN/Creatinine Ratio: 17 (ref 12–28)
BUN: 15 mg/dL (ref 8–27)
CO2: 23 mmol/L (ref 20–29)
Calcium: 9.4 mg/dL (ref 8.7–10.3)
Chloride: 102 mmol/L (ref 96–106)
Creatinine, Ser: 0.9 mg/dL (ref 0.57–1.00)
Glucose: 90 mg/dL (ref 70–99)
Potassium: 4.6 mmol/L (ref 3.5–5.2)
Sodium: 138 mmol/L (ref 134–144)
eGFR: 71 mL/min/{1.73_m2} (ref >59)

## 2021-10-19 ENCOUNTER — Encounter: Payer: Self-pay | Admitting: Podiatry

## 2021-10-19 NOTE — Progress Notes (Signed)
Subjective:  Patient ID: Jacqueline Mora, female    DOB: [birth year],  MRN: [id number]  Chief Complaint  Patient presents with   Foot Pain    Right foot pain     66 y.o. female presents with the above complaint.  Patient presents for follow-up of right posterior Achilles heel spur/Achilles tendinitis.  She states that the cam boot helped a little bit.  She still has some residual pain.  She would like to discuss next treatment plans.   Review of Systems: Negative except as noted in the HPI. Denies N/V/F/Ch.  Past Medical History:  Diagnosis Date   Abdominal hernia    Anxiety    BMI 45.0-49.9, adult (HCC)    Cancer (HCC)    Colon cancer ([place])    COVID-19    Depression    GERD (gastroesophageal reflux disease)    History of colon polyps    Hypercholesteremia    Hypertension    Hypothyroidism    IBS (irritable bowel syndrome)    Prediabetes    Squamous cell carcinoma in situ (SCCIS) of skin    Thyroid disease    Vitamin D deficiency     Current Outpatient Medications:    acetaminophen (TYLENOL) 500 MG tablet, Take 500 mg by mouth every 6 (six) hours as needed., Disp: , Rfl:    Alpha-Lipoic Acid 300 MG TABS, Take by mouth in the morning and at bedtime., Disp: , Rfl:    ALPRAZolam (XANAX) 0.5 MG tablet, Take 0.5 mg by mouth as needed for anxiety., Disp: , Rfl:    cholecalciferol (VITAMIN D) 1000 units tablet, Take 2,000 Units by mouth once a week. , Disp: , Rfl:    doxycycline (VIBRAMYCIN) 100 MG capsule, Take 100 mg by mouth daily., Disp: , Rfl:    estradiol (ESTRACE) 2 MG tablet, Take 2 mg by mouth daily., Disp: , Rfl:    ezetimibe (ZETIA) 10 MG tablet, Take 1 tablet (10 mg total) by mouth daily., Disp: 90 tablet, Rfl: 3   FLUoxetine (PROZAC) 10 MG capsule, Take 10 mg by mouth daily., Disp: , Rfl:    FLUoxetine (PROZAC) 20 MG capsule, Take 20 mg by mouth daily., Disp: , Rfl:    fluticasone (FLONASE) 50 MCG/ACT nasal spray, as needed., Disp: , Rfl: 5   furosemide (LASIX) 40  MG tablet, Take 0.5 tablets (20 mg total) by mouth daily., Disp: 90 tablet, Rfl: 3   ketoconazole (NIZORAL) 2 % cream, in the morning and at bedtime., Disp: , Rfl:    levothyroxine (SYNTHROID) 150 MCG tablet, Take 150 mcg by mouth daily before breakfast., Disp: , Rfl:    losartan (COZAAR) 100 MG tablet, Take 1 tablet by mouth daily., Disp: , Rfl:    RABEprazole (ACIPHEX) 20 MG tablet, Take 20 mg by mouth daily., Disp: , Rfl:    spironolactone (ALDACTONE) 25 MG tablet, Take 1 tablet (25 mg total) by mouth daily., Disp: 90 tablet, Rfl: 3   Turmeric (QC TUMERIC COMPLEX PO), Take by mouth daily at 6 (six) AM., Disp: , Rfl:   Social History   Tobacco Use  Smoking Status Former   Types: Cigarettes  Smokeless Tobacco Never  Tobacco Comments   Quit 1984    Allergies  Allergen Reactions   Amlodipine Besylate Other (See Comments)    Dizziness   Clindamycin Hcl     Other reaction(s): rash   Eszopiclone     Other reaction(s): Crazy dreams   Penicillin G     Other reaction(s):  rash   Tape     Adhesive on medical tape - requires paper tape.   Zocor [Simvastatin]    Objective:  There were no vitals filed for this visit. There is no height or weight on file to calculate BMI. Constitutional Well developed. Well nourished.  Vascular Dorsalis pedis pulses palpable bilaterally. Posterior tibial pulses palpable bilaterally. Capillary refill normal to all digits.  No cyanosis or clubbing noted. Pedal hair growth normal.  Neurologic Normal speech. Oriented to person, place, and time. Epicritic sensation to light touch grossly present bilaterally.  Dermatologic Nails well groomed and normal in appearance. No open wounds. No skin lesions.  Orthopedic: Pain on palpation of right Achilles tendon insertional pain.  Posterior heel spurring is with/Haglund's deformity clinically appreciated.  Positive Silfverskiold test with gastrocnemius equinus noted.   Radiographs: 3 views of skeletally mature  the right foot: Posterior heel spurring noted plantar heel spurring noted pes planovalgus foot structure noted.  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  No other bony abnormalities identified. Assessment:   1. Achilles tendinitis, right leg   2. Haglund's deformity, right     Plan:  Patient was evaluated and treated and all questions answered.  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  She still has some residual pain.  At this time I discussed that she would benefit from a steroid injection I discussed that given this is near the tendon there is a risk of rupture associated with it.  She states understanding to proceed with a steroid injection. -A steroid injection was performed at right Kager's fat pad using 1% plain Lidocaine and 10 mg of Kenalog. This was well tolerated.  -Heel lifts were dispensed to take the stress away from the Achilles tendon insertion.  No follow-ups on file.

## 2021-11-01 ENCOUNTER — Other Ambulatory Visit: Payer: Self-pay | Admitting: Internal Medicine

## 2021-11-22 DIAGNOSIS — E559 Vitamin D deficiency, unspecified: Secondary | ICD-10-CM | POA: Diagnosis not present

## 2021-11-22 DIAGNOSIS — D508 Other iron deficiency anemias: Secondary | ICD-10-CM | POA: Diagnosis not present

## 2021-11-22 DIAGNOSIS — F419 Anxiety disorder, unspecified: Secondary | ICD-10-CM | POA: Diagnosis not present

## 2021-11-22 DIAGNOSIS — R7303 Prediabetes: Secondary | ICD-10-CM | POA: Diagnosis not present

## 2021-11-22 DIAGNOSIS — Z0000 Encounter for general adult medical examination without abnormal findings: Secondary | ICD-10-CM | POA: Diagnosis not present

## 2021-11-22 DIAGNOSIS — Z1159 Encounter for screening for other viral diseases: Secondary | ICD-10-CM | POA: Diagnosis not present

## 2021-11-22 DIAGNOSIS — E039 Hypothyroidism, unspecified: Secondary | ICD-10-CM | POA: Diagnosis not present

## 2021-11-23 ENCOUNTER — Ambulatory Visit: Payer: Medicare Other

## 2021-12-11 ENCOUNTER — Institutional Professional Consult (permissible substitution): Payer: Medicare Other | Admitting: Neurology

## 2021-12-19 ENCOUNTER — Ambulatory Visit: Payer: Medicare Other | Admitting: Internal Medicine

## 2022-01-02 ENCOUNTER — Encounter: Payer: Self-pay | Admitting: Neurology

## 2022-01-30 ENCOUNTER — Institutional Professional Consult (permissible substitution): Payer: Medicare Other | Admitting: Neurology

## 2022-03-09 NOTE — Progress Notes (Unsigned)
Cardiology Office Note:    Date:  03/12/2022   ID:  Jacqueline Mora, DOB 16-Aug-1956, MRN [id number]  PCP:  [place], [place], [place]  Cardiologist:  Werner Lean, MD  Advanced Practice Provider:  No care team member to display Electrophysiologist:  None      CC: Chest pain f/u  History of Present Illness:    Jacqueline Mora is a 66 y.o. female with a hx of HTN, HLD, who presents for evaluation of chest pain 6/33/35. 4562: diastolic dysfunction on echo and started on low dose lasix has had higher dose of losartan. Had negative Lexiscan with a hypertensive response and started amlodipine in interim.   2023: started on Saxenda and had issues with aldactone (dizziness), lost 25 lbs with Saxenda  Patient notes that she is doing .   Since last visit notes that she couldn't get the Saxenda any more with natural shortages   Gained 10 lbs.  At nadir felt so much better and appetite improved. There are no interval hospital/ED visit.    No chest pain or pressure .  No SOB/DOE and no PND/Orthopnea.  No weight gain or leg swelling.  No palpitations or syncope.  Ambulatory blood pressure 130/70s.  Past Medical History:  Diagnosis Date   Abdominal hernia    Anxiety    BMI 45.0-49.9, adult ([place])    Cancer (HCC)    Colon cancer ([place])    COVID-19    Depression    GERD (gastroesophageal reflux disease)    History of colon polyps    Hypercholesteremia    Hypertension    Hypothyroidism    IBS (irritable bowel syndrome)    Prediabetes    Squamous cell carcinoma in situ (SCCIS) of skin    Thyroid disease    Vitamin D deficiency     Past Surgical History:  Procedure Laterality Date   ABDOMINAL HYSTERECTOMY     CESAREAN SECTION     x 2   colon cancer     COLONOSCOPY     HERNIA REPAIR     Mesh placed then later removed due to infection.   laparostomy      Current Medications: Current Meds  Medication Sig   acetaminophen (TYLENOL)  500 MG tablet Take 500 mg by mouth every 6 (six) hours as needed.   Alpha-Lipoic Acid 300 MG TABS Take by mouth in the morning and at bedtime.   ALPRAZolam (XANAX) 0.5 MG tablet Take 0.5 mg by mouth as needed for anxiety.   cholecalciferol (VITAMIN D) 1000 units tablet Take 2,000 Units by mouth once a week.    doxycycline (VIBRAMYCIN) 100 MG capsule Take 100 mg by mouth daily.   estradiol (ESTRACE) 2 MG tablet Take 2 mg by mouth daily.   ezetimibe (ZETIA) 10 MG tablet Take 1 tablet (10 mg total) by mouth daily.   FLUoxetine (PROZAC) 10 MG capsule Take 10 mg by mouth daily.   FLUoxetine (PROZAC) 20 MG capsule Take 20 mg by mouth daily.   fluticasone (FLONASE) 50 MCG/ACT nasal spray as needed.   furosemide (LASIX) 40 MG tablet Take 0.5 tablets (20 mg total) by mouth daily.   ketoconazole (NIZORAL) 2 % cream in the morning and at bedtime.   levothyroxine (SYNTHROID) 150 MCG tablet Take 150 mcg by mouth daily before breakfast.   losartan (COZAAR) 100 MG tablet Take 1 tablet by mouth daily.   RABEprazole (ACIPHEX) 20 MG tablet Take 20 mg by  mouth daily.   spironolactone (ALDACTONE) 25 MG tablet Take 1 tablet (25 mg total) by mouth daily.   Turmeric (QC TUMERIC COMPLEX PO) Take by mouth daily at 6 (six) AM.     Allergies:   Amlodipine besylate, Clindamycin hcl, Eszopiclone, Penicillin g, Tape, and Zocor [simvastatin]   Social History   Socioeconomic History   Marital status: Widowed    Spouse name: Not on file   Number of children: 2   Years of education: 4 yrs college   Highest education level: Not on file  Occupational History   Occupation: Retired  Tobacco Use   Smoking status: Former    Types: Cigarettes   Smokeless tobacco: Never   Tobacco comments:    Quit 1984  Vaping Use   Vaping Use: Never used  Substance and Sexual Activity   Alcohol use: Yes    Comment: occasionally   Drug use: No   Sexual activity: Not on file  Other Topics Concern   Not on file  Social History  Narrative   Lives at home with husband.   Right-handed.   4-5 cups unsweet tea per week.   Social Determinants of Health   Financial Resource Strain: Not on file  Food Insecurity: Not on file  Transportation Needs: Not on file  Physical Activity: Not on file  Stress: Not on file  Social Connections: Not on file    Social: brother had a 4Vd Bypass  Family History: The patient's family history includes Diabetes in her father; Kidney cancer in her mother. History of coronary artery disease notable for two aunts and brother. History of PAD in father. History of heart failure notable for no members. History of arrhythmia notable for no members.  ROS:   Please see the history of present illness.     All other systems reviewed and are negative.  EKGs/Labs/Other Studies Reviewed:    The following studies were reviewed today:  EKG:   03/12/22: SR rate 75 05/03/21: SR rate 70 borderline anterior infarct 12/08/20:  SR rate 73 WNL  Transthoracic Echocardiogram: Date: 01/09/21 Results:  1. Left ventricular ejection fraction, by estimation, is 60 to 65%. The  left ventricle has normal function. The left ventricle has no regional  wall motion abnormalities. There is mild left ventricular hypertrophy.  Left ventricular diastolic parameters  are consistent with Grade II diastolic dysfunction (pseudonormalization).  Elevated left ventricular end-diastolic pressure.   2. Right ventricular systolic function is normal. The right ventricular  size is normal.   3. Left atrial size was mildly dilated.   4. The mitral valve is abnormal. Trivial mitral valve regurgitation. No  evidence of mitral stenosis.   5. The aortic valve was not well visualized. There is mild calcification  of the aortic valve. Aortic valve regurgitation is not visualized. Mild  aortic valve sclerosis is present, with no evidence of aortic valve  stenosis.   6. The inferior vena cava is normal in size with greater than  50%  respiratory variability, suggesting right atrial pressure of 3 mmHg.   Recent Labs: 10/16/2021: BUN 15; Creatinine, Ser 0.90; Potassium 4.6; Sodium 138  Recent Lipid Panel    Component Value Date/Time   CHOL 226 (H) 09/13/2021 1159   TRIG 229 (H) 09/13/2021 1159   HDL 50 09/13/2021 1159   CHOLHDL 4.5 (H) 09/13/2021 1159   LDLCALC 135 (H) 09/13/2021 1159     Physical Exam:    VS:  BP 140/83   Pulse 75   Ht  $'5\' 6"'Z$  (1.676 m)   Wt 285 lb (129.3 kg)   SpO2 97%   BMI 46.00 kg/m     Wt Readings from Last 3 Encounters:  03/12/22 285 lb (129.3 kg)  10/16/21 286 lb 12.8 oz (130.1 kg)  09/21/21 294 lb (133.4 kg)    Gen: no distress, morbid obesity  Neck: No JVD Cardiac: No Rubs or Gallops, no  Murmur, regular rhythm +2 radial pulses Respiratory: Clear to auscultation bilaterally, normal effort, normal  respiratory rate GI: Soft, nontender, non-distended  MS: No  edema;  moves all extremities Integument: Skin feels warm Neuro:  At time of evaluation, alert and oriented to person/place/time/situation  Psych: Normal affect, patient feels OK  ASSESSMENT:    1. Hyperlipidemia, unspecified hyperlipidemia type   2. Morbid obesity ([place])   3. Essential hypertension     PLAN:    Morbid Obesity with prior FenFen Use HFpEF - NYHA class I, Stage B, euvolemic, etiology related to obesity HTN HLD Hypothyroidism - has dizziness and head spinning with norvasc - has been doing well with ambulatory BP control on lasix 20 mg PO daily, losartan 100 mg PO daily, and aldactone 25 mg PO daily - zetia 10 mg PO daily - fasting lipids and ALT later this week or two - Did well on GLP-1, hopefully insurance coverage will improve  One year f/u          Medication Adjustments/Labs and Tests Ordered: Current medicines are reviewed at length with the patient today.  Concerns regarding medicines are outlined above.  Orders Placed This Encounter  Procedures   ALT   Lipid panel   EKG  12-Lead    No orders of the defined types were placed in this encounter.    Patient Instructions  Medication Instructions:  Your physician recommends that you continue on your current medications as directed. Please refer to the Current Medication list given to you today.  *If you need a refill on your cardiac medications before your next appointment, please call your pharmacy*   Lab Work: Aug 3. 2023: FLP and ALT (nothing to eat or drink 12 hours prior except for water and black coffee) If you have labs (blood work) drawn today and your tests are completely normal, you will receive your results only by: [place] (if you have MyChart) OR A paper copy in the mail If you have any lab test that is abnormal or we need to change your treatment, we will call you to review the results.   Testing/Procedures: NONE   Follow-Up: At [facility], you and your health needs are our priority.  As part of our continuing mission to provide you with exceptional heart care, we have created designated Provider Care Teams.  These Care Teams include your primary Cardiologist (physician) and Advanced Practice Providers (APPs -  Physician Assistants and Nurse Practitioners) who all work together to provide you with the care you need, when you need it.  We recommend signing up for the patient portal called "MyChart".  Sign up information is provided on this After Visit Summary.  MyChart is used to connect with patients for Virtual Visits (Telemedicine).  Patients are able to view lab/test results, encounter notes, upcoming appointments, etc.  Non-urgent messages can be sent to your provider as well.   To learn more about what you can do with MyChart, go to [email or web address].    Your next appointment:   1 year(s)  The format for your next appointment:   In  Person  Provider:   Werner Lean, MD    Important Information About Sugar         Signed, Werner Lean,  MD  03/12/2022 5:29 PM    [facility]

## 2022-03-12 ENCOUNTER — Ambulatory Visit: Payer: Medicare Other | Admitting: Internal Medicine

## 2022-03-12 ENCOUNTER — Encounter: Payer: Self-pay | Admitting: Internal Medicine

## 2022-03-12 VITALS — BP 140/83 | HR 75 | Ht 66.0 in | Wt 285.0 lb

## 2022-03-12 DIAGNOSIS — I10 Essential (primary) hypertension: Secondary | ICD-10-CM | POA: Diagnosis not present

## 2022-03-12 DIAGNOSIS — E785 Hyperlipidemia, unspecified: Secondary | ICD-10-CM

## 2022-03-12 NOTE — Patient Instructions (Signed)
Medication Instructions:  Your physician recommends that you continue on your current medications as directed. Please refer to the Current Medication list given to you today.  *If you need a refill on your cardiac medications before your next appointment, please call your pharmacy*   Lab Work: Aug 3. 2023: FLP and ALT (nothing to eat or drink 12 hours prior except for water and black coffee) If you have labs (blood work) drawn today and your tests are completely normal, you will receive your results only by: [place] (if you have MyChart) OR A paper copy in the mail If you have any lab test that is abnormal or we need to change your treatment, we will call you to review the results.   Testing/Procedures: NONE   Follow-Up: At [facility], you and your health needs are our priority.  As part of our continuing mission to provide you with exceptional heart care, we have created designated Provider Care Teams.  These Care Teams include your primary Cardiologist (physician) and Advanced Practice Providers (APPs -  Physician Assistants and Nurse Practitioners) who all work together to provide you with the care you need, when you need it.  We recommend signing up for the patient portal called "MyChart".  Sign up information is provided on this After Visit Summary.  MyChart is used to connect with patients for Virtual Visits (Telemedicine).  Patients are able to view lab/test results, encounter notes, upcoming appointments, etc.  Non-urgent messages can be sent to your provider as well.   To learn more about what you can do with MyChart, go to [email or web address].    Your next appointment:   1 year(s)  The format for your next appointment:   In Person  Provider:   Werner Lean, MD    Important Information About Sugar

## 2022-03-22 ENCOUNTER — Other Ambulatory Visit: Payer: Medicare Other

## 2022-03-29 ENCOUNTER — Other Ambulatory Visit: Payer: Medicare Other

## 2022-03-29 DIAGNOSIS — E785 Hyperlipidemia, unspecified: Secondary | ICD-10-CM | POA: Diagnosis not present

## 2022-03-29 LAB — ALT: ALT: 10 IU/L (ref 0–32)

## 2022-03-29 LAB — LIPID PANEL
Chol/HDL Ratio: 3.4 ratio (ref 0.0–4.4)
Cholesterol, Total: 181 mg/dL (ref 100–199)
HDL: 53 mg/dL (ref >39)
LDL Chol Calc (NIH): 101 mg/dL — ABNORMAL HIGH (ref 0–99)
Triglycerides: 154 mg/dL — ABNORMAL HIGH (ref 0–149)
VLDL Cholesterol Cal: 27 mg/dL (ref 5–40)

## 2022-05-17 DIAGNOSIS — Z1231 Encounter for screening mammogram for malignant neoplasm of breast: Secondary | ICD-10-CM | POA: Diagnosis not present

## 2022-05-17 DIAGNOSIS — Z6841 Body Mass Index (BMI) 40.0 and over, adult: Secondary | ICD-10-CM | POA: Diagnosis not present

## 2022-05-17 DIAGNOSIS — Z01419 Encounter for gynecological examination (general) (routine) without abnormal findings: Secondary | ICD-10-CM | POA: Diagnosis not present

## 2022-05-18 ENCOUNTER — Other Ambulatory Visit: Payer: Self-pay

## 2022-05-18 ENCOUNTER — Ambulatory Visit: Payer: Medicare Other | Admitting: Podiatry

## 2022-05-18 DIAGNOSIS — Q666 Other congenital valgus deformities of feet: Secondary | ICD-10-CM | POA: Diagnosis not present

## 2022-05-18 DIAGNOSIS — M7661 Achilles tendinitis, right leg: Secondary | ICD-10-CM | POA: Diagnosis not present

## 2022-05-18 DIAGNOSIS — M9261 Juvenile osteochondrosis of tarsus, right ankle: Secondary | ICD-10-CM | POA: Diagnosis not present

## 2022-05-18 MED ORDER — METHYLPREDNISOLONE 4 MG PO TBPK: ORAL_TABLET | ORAL | 0 refills | Status: DC

## 2022-05-18 MED ORDER — MELOXICAM 15 MG PO TABS: 15.0000 mg | ORAL_TABLET | Freq: Every day | ORAL | 0 refills | Status: DC

## 2022-05-18 NOTE — Progress Notes (Signed)
Subjective:  Patient ID: Jacqueline Mora, female    DOB: 28-Mar-1956,  MRN: [id number]  Chief Complaint  Patient presents with   Foot Pain    66 y.o. female presents with the above complaint.  Patient was to follow-up for right posterior Achilles tendinitis.  She states she states that it came back again the injection helped for 6 months she would like to discuss another injection she is going to the beach denies any other acute complaints.  Hurts with ambulation hurts with pressure pain scale 7 out of 10 sharp shooting in nature   Review of Systems: Negative except as noted in the HPI. Denies N/V/F/Ch.  Past Medical History:  Diagnosis Date   Abdominal hernia    Anxiety    BMI 45.0-49.9, adult (HCC)    Cancer (HCC)    Colon cancer ([place])    COVID-19    Depression    GERD (gastroesophageal reflux disease)    History of colon polyps    Hypercholesteremia    Hypertension    Hypothyroidism    IBS (irritable bowel syndrome)    Prediabetes    Squamous cell carcinoma in situ (SCCIS) of skin    Thyroid disease    Vitamin D deficiency     Current Outpatient Medications:    acetaminophen (TYLENOL) 500 MG tablet, Take 500 mg by mouth every 6 (six) hours as needed., Disp: , Rfl:    Alpha-Lipoic Acid 300 MG TABS, Take by mouth in the morning and at bedtime., Disp: , Rfl:    ALPRAZolam (XANAX) 0.5 MG tablet, Take 0.5 mg by mouth as needed for anxiety., Disp: , Rfl:    cholecalciferol (VITAMIN D) 1000 units tablet, Take 2,000 Units by mouth once a week. , Disp: , Rfl:    doxycycline (VIBRAMYCIN) 100 MG capsule, Take 100 mg by mouth daily., Disp: , Rfl:    estradiol (ESTRACE) 2 MG tablet, Take 2 mg by mouth daily., Disp: , Rfl:    ezetimibe (ZETIA) 10 MG tablet, Take 1 tablet (10 mg total) by mouth daily., Disp: 90 tablet, Rfl: 3   FLUoxetine (PROZAC) 10 MG capsule, Take 10 mg by mouth daily., Disp: , Rfl:    FLUoxetine (PROZAC) 20 MG capsule, Take 20 mg by mouth daily., Disp: , Rfl:     fluticasone (FLONASE) 50 MCG/ACT nasal spray, as needed., Disp: , Rfl: 5   furosemide (LASIX) 40 MG tablet, Take 0.5 tablets (20 mg total) by mouth daily., Disp: 90 tablet, Rfl: 3   ketoconazole (NIZORAL) 2 % cream, in the morning and at bedtime., Disp: , Rfl:    levothyroxine (SYNTHROID) 150 MCG tablet, Take 150 mcg by mouth daily before breakfast., Disp: , Rfl:    losartan (COZAAR) 100 MG tablet, Take 1 tablet by mouth daily., Disp: , Rfl:    RABEprazole (ACIPHEX) 20 MG tablet, Take 20 mg by mouth daily., Disp: , Rfl:    spironolactone (ALDACTONE) 25 MG tablet, Take 1 tablet (25 mg total) by mouth daily., Disp: 90 tablet, Rfl: 3   Turmeric (QC TUMERIC COMPLEX PO), Take by mouth daily at 6 (six) AM., Disp: , Rfl:   Social History   Tobacco Use  Smoking Status Former   Types: Cigarettes  Smokeless Tobacco Never  Tobacco Comments   Quit 1984    Allergies  Allergen Reactions   Amlodipine Besylate Other (See Comments)    Dizziness   Clindamycin Hcl     Other reaction(s): rash   Eszopiclone  Other reaction(s): Crazy dreams   Penicillin G     Other reaction(s): rash   Tape     Adhesive on medical tape - requires paper tape.   Zocor [Simvastatin]    Objective:  There were no vitals filed for this visit. There is no height or weight on file to calculate BMI. Constitutional Well developed. Well nourished.  Vascular Dorsalis pedis pulses palpable bilaterally. Posterior tibial pulses palpable bilaterally. Capillary refill normal to all digits.  No cyanosis or clubbing noted. Pedal hair growth normal.  Neurologic Normal speech. Oriented to person, place, and time. Epicritic sensation to light touch grossly present bilaterally.  Dermatologic Nails well groomed and normal in appearance. No open wounds. No skin lesions.  Orthopedic: Pain on palpation of right Achilles tendon insertional pain.  Posterior heel spurring is with/Haglund's deformity clinically appreciated.  Positive  Silfverskiold test with gastrocnemius equinus noted.   Radiographs: 3 views of skeletally mature the right foot: Posterior heel spurring noted plantar heel spurring noted pes planovalgus foot structure noted.  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  No other bony abnormalities identified. Assessment:   1. Achilles tendinitis, right leg   2. Pes planovalgus   3. Haglund's deformity, right      Plan:  Patient was evaluated and treated and all questions answered.  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  She still has some residual pain.  At this time I discussed that she would benefit from a steroid injection I discussed that given this is near the tendon there is a risk of rupture associated with it.  She states understanding to proceed with a steroid injection.  Given that the previous injection was more than 6 months ago she may benefit from another 1 -Another steroid injection was performed at right Kager's fat pad using 1% plain Lidocaine and 10 mg of Kenalog. This was well tolerated.  -Heel lifts were dispensed to take the stress away from the Achilles tendon insertion.  Pes planovalgus -I explained to patient the etiology of pes planovalgus and relationship with Achilles tendinitis and various treatment options were discussed.  Given patient foot structure in the setting of Achilles tendinitis I believe patient will benefit from custom-made orthotics to help control the hindfoot motion support the arch of the foot and take the stress away from plantar fascial.  Patient agrees with the plan like to proceed with orthotics -Patient was casted for orthotics with heel lifts   No follow-ups on file.

## 2022-06-08 ENCOUNTER — Telehealth: Payer: Self-pay | Admitting: Podiatry

## 2022-06-08 NOTE — Telephone Encounter (Signed)
Called pt to let her know that her orthotics are in and to get her scheduled on the casting schedule to pick them up and make sure they fit her properly. Pt stated she just spoke to our billing department because she was told by [place] that Medicare covers orthotics but she received letters in the mail from her insurance that the cost was almost $500. She stated the person she spoke to in the insurance department stated not to pick them up as they are going to try and get them where they are covered and to wait. Told pt I would put them to the side for now.  *There is a Barrister's clerk Form by the patient scanned into the chart.*

## 2022-06-18 ENCOUNTER — Encounter: Payer: Self-pay | Admitting: Pharmacist

## 2022-07-09 ENCOUNTER — Other Ambulatory Visit: Payer: Self-pay | Admitting: Internal Medicine

## 2022-07-23 ENCOUNTER — Encounter (INDEPENDENT_AMBULATORY_CARE_PROVIDER_SITE_OTHER): Payer: Medicare Other | Admitting: Internal Medicine

## 2022-08-06 DIAGNOSIS — L720 Epidermal cyst: Secondary | ICD-10-CM | POA: Diagnosis not present

## 2022-08-06 DIAGNOSIS — L570 Actinic keratosis: Secondary | ICD-10-CM | POA: Diagnosis not present

## 2022-08-06 DIAGNOSIS — L565 Disseminated superficial actinic porokeratosis (DSAP): Secondary | ICD-10-CM | POA: Diagnosis not present

## 2022-08-06 DIAGNOSIS — L821 Other seborrheic keratosis: Secondary | ICD-10-CM | POA: Diagnosis not present

## 2022-08-15 ENCOUNTER — Encounter: Payer: Self-pay | Admitting: Podiatry

## 2022-08-15 ENCOUNTER — Ambulatory Visit: Payer: Medicare Other | Admitting: Podiatry

## 2022-08-15 DIAGNOSIS — M7661 Achilles tendinitis, right leg: Secondary | ICD-10-CM

## 2022-08-15 DIAGNOSIS — S86011A Strain of right Achilles tendon, initial encounter: Secondary | ICD-10-CM | POA: Diagnosis not present

## 2022-08-15 DIAGNOSIS — Z01818 Encounter for other preprocedural examination: Secondary | ICD-10-CM

## 2022-08-15 NOTE — Progress Notes (Signed)
Subjective:  Patient ID: Jacqueline Mora, female    DOB: [birth year],  MRN: [id number]  Chief Complaint  Patient presents with   Foot Pain    66 y.o. female presents with the above complaint.  Patient presents with right Achilles tendon acute rupture.  She states that she was at the beach.  She heard a pop and she had a pain going up the leg.  She immediately could not put weight down on the foot.  She placed herself in the boot.  She is worried that she has ruptured and she would like to discuss treatment options.  Her pain is 7 out of 10 hurts with ambulation hurts with pressure she walked in with a cam boot.   Review of Systems: Negative except as noted in the HPI. Denies N/V/F/Ch.  Past Medical History:  Diagnosis Date   Abdominal hernia    Anxiety    BMI 45.0-49.9, adult (HCC)    Cancer (HCC)    Colon cancer ([place])    COVID-19    Depression    GERD (gastroesophageal reflux disease)    History of colon polyps    Hypercholesteremia    Hypertension    Hypothyroidism    IBS (irritable bowel syndrome)    Prediabetes    Squamous cell carcinoma in situ (SCCIS) of skin    Thyroid disease    Vitamin D deficiency     Current Outpatient Medications:    acetaminophen (TYLENOL) 500 MG tablet, Take 500 mg by mouth every 6 (six) hours as needed., Disp: , Rfl:    Alpha-Lipoic Acid 300 MG TABS, Take by mouth in the morning and at bedtime., Disp: , Rfl:    ALPRAZolam (XANAX) 0.5 MG tablet, Take 0.5 mg by mouth as needed for anxiety., Disp: , Rfl:    cholecalciferol (VITAMIN D) 1000 units tablet, Take 2,000 Units by mouth once a week. , Disp: , Rfl:    doxycycline (VIBRAMYCIN) 100 MG capsule, Take 100 mg by mouth daily., Disp: , Rfl:    estradiol (ESTRACE) 2 MG tablet, Take 2 mg by mouth daily., Disp: , Rfl:    ezetimibe (ZETIA) 10 MG tablet, Take 1 tablet (10 mg total) by mouth daily., Disp: 90 tablet, Rfl: 3   FLUoxetine (PROZAC) 10 MG capsule, Take 10 mg by mouth daily., Disp: , Rfl:     FLUoxetine (PROZAC) 20 MG capsule, Take 20 mg by mouth daily., Disp: , Rfl:    fluticasone (FLONASE) 50 MCG/ACT nasal spray, as needed., Disp: , Rfl: 5   furosemide (LASIX) 40 MG tablet, TAKE 1 TABLET(40 MG TOTAL) BY MOUTH DAILY, Disp: 90 tablet, Rfl: 2   ketoconazole (NIZORAL) 2 % cream, in the morning and at bedtime., Disp: , Rfl:    levothyroxine (SYNTHROID) 150 MCG tablet, Take 150 mcg by mouth daily before breakfast., Disp: , Rfl:    losartan (COZAAR) 100 MG tablet, Take 1 tablet by mouth daily., Disp: , Rfl:    meloxicam (MOBIC) 15 MG tablet, Take 1 tablet (15 mg total) by mouth daily., Disp: 30 tablet, Rfl: 0   methylPREDNISolone (MEDROL DOSEPAK) 4 MG TBPK tablet, Take as directed, Disp: 21 each, Rfl: 0   RABEprazole (ACIPHEX) 20 MG tablet, Take 20 mg by mouth daily., Disp: , Rfl:    spironolactone (ALDACTONE) 25 MG tablet, Take 1 tablet (25 mg total) by mouth daily., Disp: 90 tablet, Rfl: 3   Turmeric (QC TUMERIC COMPLEX PO), Take by mouth daily at 6 (six) AM., Disp: ,  Rfl:   Social History   Tobacco Use  Smoking Status Former   Types: Cigarettes  Smokeless Tobacco Never  Tobacco Comments   Quit 1984    Allergies  Allergen Reactions   Amlodipine Besylate Other (See Comments)    Dizziness   Clindamycin Hcl     Other reaction(s): rash   Eszopiclone     Other reaction(s): Crazy dreams   Penicillin G     Other reaction(s): rash   Tape     Adhesive on medical tape - requires paper tape.   Zocor [Simvastatin]    Objective:  There were no vitals filed for this visit. There is no height or weight on file to calculate BMI. Constitutional Well developed. Well nourished.  Vascular Dorsalis pedis pulses palpable bilaterally. Posterior tibial pulses palpable bilaterally. Capillary refill normal to all digits.  No cyanosis or clubbing noted. Pedal hair growth normal.  Neurologic Normal speech. Oriented to person, place, and time. Epicritic sensation to light touch  grossly present bilaterally.  Dermatologic Nails well groomed and normal in appearance. No open wounds. No skin lesions.  Orthopedic: Pain on palpation of right Achilles tendon insertion.  Hatchet strike defect noted to the right Achilles tendon.  Acute rupture at least partially noted.  Weakness of the plantarflexion noted up to the Achilles tendon.  The heel Haglund's deformity noted positive Silfverskiold test noted with gastrocnemius equinus   Radiographs: None Assessment:   1. Achilles tendinitis, right leg   2. Rupture of right Achilles tendon, initial encounter    Plan:  Patient was evaluated and treated and all questions answered.  Right Achilles tendon rupture with positive gastrocnemius equinus -All questions and concerns were discussed with the patient in extensive detail.  Given the nature of the injury patient will benefit from primary repair of the Achilles tendon with possible resection of Haglund's deformity as well as gastrocnemius recession to increase the length of the Achilles tendon.  I discussed with patient extensive detail she states now she would like to proceed with surgery. -I will obtain a stat MRI to be done prior to surgery. -I discussed with her that she would need to be nonweightbearing to the right lower extremity after surgery -Informed surgical risk consent was reviewed and read aloud to the patient.  I reviewed the films.  I have discussed my findings with the patient in great detail.  I have discussed all risks including but not limited to infection, stiffness, scarring, limp, disability, deformity, damage to blood vessels and nerves, numbness, poor healing, need for braces, arthritis, chronic pain, amputation, death.  All benefits and realistic expectations discussed in great detail.  I have made no promises as to the outcome.  I have provided realistic expectations.  I have offered the patient a 2nd opinion, which they have declined and assured me they  preferred to proceed despite the risks   No follow-ups on file.

## 2022-08-16 ENCOUNTER — Ambulatory Visit
Admission: RE | Admit: 2022-08-16 | Discharge: 2022-08-16 | Disposition: A | Discharge status: Home / Self Care | Payer: Medicare Other | Source: Ambulatory Visit | Attending: Podiatry | Admitting: Podiatry

## 2022-08-16 ENCOUNTER — Telehealth: Payer: Self-pay | Admitting: Podiatry

## 2022-08-16 ENCOUNTER — Other Ambulatory Visit: Payer: Self-pay | Admitting: Podiatry

## 2022-08-16 DIAGNOSIS — M7661 Achilles tendinitis, right leg: Secondary | ICD-10-CM

## 2022-08-16 DIAGNOSIS — S86011A Strain of right Achilles tendon, initial encounter: Secondary | ICD-10-CM

## 2022-08-16 DIAGNOSIS — M25471 Effusion, right ankle: Secondary | ICD-10-CM | POA: Diagnosis not present

## 2022-08-16 NOTE — Telephone Encounter (Signed)
DOS: 08/17/2022  [employer] Medicare  Repair Achilles Tendon Rt ([id number]) Gastrocnemius Recess Rt ([id number])  Deductible: $0 Out-of-Pocket: $3,950 with $7,998.72 remaining CoInsurance: 0% Copay: $275  Prior authorization is not required per Myra P.  Call Reference #: [id number]

## 2022-08-17 ENCOUNTER — Other Ambulatory Visit: Payer: Self-pay | Admitting: Podiatry

## 2022-08-17 ENCOUNTER — Encounter: Payer: Self-pay | Admitting: Podiatry

## 2022-08-17 ENCOUNTER — Telehealth: Payer: Self-pay

## 2022-08-17 DIAGNOSIS — M7661 Achilles tendinitis, right leg: Secondary | ICD-10-CM

## 2022-08-17 DIAGNOSIS — S86011A Strain of right Achilles tendon, initial encounter: Secondary | ICD-10-CM | POA: Diagnosis not present

## 2022-08-17 DIAGNOSIS — G8918 Other acute postprocedural pain: Secondary | ICD-10-CM | POA: Diagnosis not present

## 2022-08-17 DIAGNOSIS — M216X2 Other acquired deformities of left foot: Secondary | ICD-10-CM | POA: Diagnosis not present

## 2022-08-17 DIAGNOSIS — M216X1 Other acquired deformities of right foot: Secondary | ICD-10-CM | POA: Diagnosis not present

## 2022-08-17 MED ORDER — OXYCODONE-ACETAMINOPHEN 5-325 MG PO TABS: 1.0000 | ORAL_TABLET | ORAL | 0 refills | Status: DC | PRN

## 2022-08-17 MED ORDER — IBUPROFEN 800 MG PO TABS: 800.0000 mg | ORAL_TABLET | Freq: Four times a day (QID) | ORAL | 1 refills | Status: DC | PRN

## 2022-08-17 NOTE — Telephone Encounter (Signed)
Order for knee scooter and Commode placed with [facility]. Laurieanne notified.

## 2022-08-18 ENCOUNTER — Encounter: Payer: Self-pay | Admitting: Podiatry

## 2022-08-19 ENCOUNTER — Encounter: Payer: Self-pay | Admitting: Podiatry

## 2022-08-21 ENCOUNTER — Encounter: Payer: Self-pay | Admitting: Podiatry

## 2022-08-21 ENCOUNTER — Telehealth: Payer: Self-pay | Admitting: Podiatry

## 2022-08-21 DIAGNOSIS — S86011A Strain of right Achilles tendon, initial encounter: Secondary | ICD-10-CM | POA: Diagnosis not present

## 2022-08-21 DIAGNOSIS — M7661 Achilles tendinitis, right leg: Secondary | ICD-10-CM

## 2022-08-21 NOTE — Addendum Note (Signed)
Addended by: Boneta Lucks on: 08/21/2022 01:33 PM   Modules accepted: Orders

## 2022-08-21 NOTE — Telephone Encounter (Signed)
Need order for a wheelchair with folding armrest  [facility]  Please advise

## 2022-08-22 ENCOUNTER — Other Ambulatory Visit: Payer: Self-pay | Admitting: Podiatry

## 2022-08-22 ENCOUNTER — Telehealth: Payer: Self-pay | Admitting: *Deleted

## 2022-08-22 DIAGNOSIS — S86011A Strain of right Achilles tendon, initial encounter: Secondary | ICD-10-CM

## 2022-08-22 DIAGNOSIS — R8290 Unspecified abnormal findings in urine: Secondary | ICD-10-CM

## 2022-08-22 MED ORDER — DOXYCYCLINE HYCLATE 100 MG PO TABS: 100.0000 mg | ORAL_TABLET | Freq: Two times a day (BID) | ORAL | 0 refills | Status: DC

## 2022-08-22 NOTE — Telephone Encounter (Signed)
Called [facility], no answer, left message, no response, faxed the order for a catheter home health to [facility] confirmation receivied, 08/22/22.

## 2022-08-23 ENCOUNTER — Encounter: Payer: Self-pay | Admitting: Podiatry

## 2022-08-24 ENCOUNTER — Encounter: Payer: Self-pay | Admitting: Podiatry

## 2022-08-24 ENCOUNTER — Ambulatory Visit (INDEPENDENT_AMBULATORY_CARE_PROVIDER_SITE_OTHER): Payer: Medicare Other | Admitting: Podiatry

## 2022-08-24 VITALS — BP 130/78

## 2022-08-24 DIAGNOSIS — S86011A Strain of right Achilles tendon, initial encounter: Secondary | ICD-10-CM

## 2022-08-24 DIAGNOSIS — Z9889 Other specified postprocedural states: Secondary | ICD-10-CM

## 2022-08-24 NOTE — Progress Notes (Addendum)
Subjective:  Patient ID: Jacqueline Mora, female    DOB: Dec 28, 1955,  MRN: [id number]  Chief Complaint  Patient presents with   Routine Post Op    POV #1 DOS 08/17/2022 RT ACHILLES TENDON REPAIR W/RESECTION OF HAGLUNDS DEFORMITY W/GASTROC RECESSION    DOS: 08/17/2022 Procedure: Right Achilles tendon repair primary  67 y.o. female returns for post-op check.  Patient states that she is doing well.  Denies any other acute complaints.  She is having some pain.  She has not been putting any weight down to the right lower extremity.  She has range of motion of the ankle joint  Review of Systems: Negative except as noted in the HPI. Denies N/V/F/Ch.  Past Medical History:  Diagnosis Date   Abdominal hernia    Anxiety    BMI 45.0-49.9, adult (HCC)    Cancer (HCC)    Colon cancer ([place])    COVID-19    Depression    GERD (gastroesophageal reflux disease)    History of colon polyps    Hypercholesteremia    Hypertension    Hypothyroidism    IBS (irritable bowel syndrome)    Prediabetes    Squamous cell carcinoma in situ (SCCIS) of skin    Thyroid disease    Vitamin D deficiency     Current Outpatient Medications:    ibuprofen (ADVIL) 800 MG tablet, Take 1 tablet (800 mg total) by mouth every 6 (six) hours as needed., Disp: 60 tablet, Rfl: 1   oxyCODONE-acetaminophen (PERCOCET) 5-325 MG tablet, Take 1 tablet by mouth every 4 (four) hours as needed for severe pain., Disp: 30 tablet, Rfl: 0   acetaminophen (TYLENOL) 500 MG tablet, Take 500 mg by mouth every 6 (six) hours as needed., Disp: , Rfl:    Alpha-Lipoic Acid 300 MG TABS, Take by mouth in the morning and at bedtime., Disp: , Rfl:    ALPRAZolam (XANAX) 0.5 MG tablet, Take 0.5 mg by mouth as needed for anxiety., Disp: , Rfl:    cholecalciferol (VITAMIN D) 1000 units tablet, Take 2,000 Units by mouth once a week. , Disp: , Rfl:    doxycycline (VIBRA-TABS) 100 MG tablet, Take 1 tablet (100 mg total) by mouth 2 (two) times daily.,  Disp: 28 tablet, Rfl: 0   doxycycline (VIBRAMYCIN) 100 MG capsule, Take 100 mg by mouth daily., Disp: , Rfl:    estradiol (ESTRACE) 2 MG tablet, Take 2 mg by mouth daily., Disp: , Rfl:    ezetimibe (ZETIA) 10 MG tablet, Take 1 tablet (10 mg total) by mouth daily., Disp: 90 tablet, Rfl: 3   FLUoxetine (PROZAC) 10 MG capsule, Take 10 mg by mouth daily., Disp: , Rfl:    FLUoxetine (PROZAC) 20 MG capsule, Take 20 mg by mouth daily., Disp: , Rfl:    fluticasone (FLONASE) 50 MCG/ACT nasal spray, as needed., Disp: , Rfl: 5   furosemide (LASIX) 40 MG tablet, TAKE 1 TABLET(40 MG TOTAL) BY MOUTH DAILY, Disp: 90 tablet, Rfl: 2   ketoconazole (NIZORAL) 2 % cream, in the morning and at bedtime., Disp: , Rfl:    levothyroxine (SYNTHROID) 150 MCG tablet, Take 150 mcg by mouth daily before breakfast., Disp: , Rfl:    losartan (COZAAR) 100 MG tablet, Take 1 tablet by mouth daily., Disp: , Rfl:    meloxicam (MOBIC) 15 MG tablet, Take 1 tablet (15 mg total) by mouth daily., Disp: 30 tablet, Rfl: 0   methylPREDNISolone (MEDROL DOSEPAK) 4 MG TBPK tablet, Take as directed, Disp: 21  each, Rfl: 0   RABEprazole (ACIPHEX) 20 MG tablet, Take 20 mg by mouth daily., Disp: , Rfl:    spironolactone (ALDACTONE) 25 MG tablet, Take 1 tablet (25 mg total) by mouth daily., Disp: 90 tablet, Rfl: 3   Turmeric (QC TUMERIC COMPLEX PO), Take by mouth daily at 6 (six) AM., Disp: , Rfl:    valACYclovir (VALTREX) 500 MG tablet, , Disp: , Rfl:   Social History   Tobacco Use  Smoking Status Former   Types: Cigarettes  Smokeless Tobacco Never  Tobacco Comments   Quit 1984    Allergies  Allergen Reactions   Amlodipine Besylate Other (See Comments)    Dizziness   Clindamycin Hcl     Other reaction(s): rash   Eszopiclone     Other reaction(s): Crazy dreams   Penicillin G     Other reaction(s): rash   Tape     Adhesive on medical tape - requires paper tape.   Zocor [Simvastatin]    Objective:   Vitals:   08/24/22 0912   BP: 130/78   There is no height or weight on file to calculate BMI. Constitutional Well developed. Well nourished.  Vascular Foot warm and well perfused. Capillary refill normal to all digits.   Neurologic Normal speech. Oriented to person, place, and time. Epicritic sensation to light touch grossly present bilaterally.  Dermatologic Skin healing well without signs of infection. Skin edges well coapted without signs of infection.  Will do dorsiflex the ankle and plantarflex ankle  Orthopedic: Tenderness to palpation noted about the surgical site.   Radiographs: None Assessment:   1. Rupture of right Achilles tendon, initial encounter   2. S/P foot surgery    Plan:  Patient was evaluated and treated and all questions answered.  S/p foot surgery right -Progressing as expected post-operatively. -XR: Not -WB Status: Nonweightbearing in wheelchair -Sutures: Intact.  No signs of dehiscence noted patient noted -Medications: None -Foot redressed. -Continue [facility] held as patient cannot cane or walker to ambulate due to the nature of her injury in the setting of obesity.  No follow-ups on file.

## 2022-08-27 ENCOUNTER — Other Ambulatory Visit: Payer: Self-pay

## 2022-08-27 ENCOUNTER — Telehealth: Payer: Self-pay | Admitting: *Deleted

## 2022-08-27 MED ORDER — EZETIMIBE 10 MG PO TABS: 10.0000 mg | ORAL_TABLET | Freq: Every day | ORAL | 3 refills | Status: DC

## 2022-08-27 NOTE — Telephone Encounter (Signed)
Crystal w/ [facility] is calling to asking that the order be changed to indwelling for the cather, also is needing size and the doctor that will be monitoring(usually a Neurologist), is complicated, is suggesting ordering a commode instead for patient. Any questions, please call [phone]

## 2022-08-28 NOTE — Telephone Encounter (Signed)
Called Crystal ([facility])to update and she will let the patient know that will be using the commode instead of the catheter, reached out to [place] as well to update as well.

## 2022-08-30 ENCOUNTER — Encounter: Payer: Self-pay | Admitting: Podiatry

## 2022-08-30 ENCOUNTER — Other Ambulatory Visit: Payer: Self-pay | Admitting: Podiatry

## 2022-08-30 MED ORDER — OXYCODONE-ACETAMINOPHEN 5-325 MG PO TABS: 1.0000 | ORAL_TABLET | ORAL | 0 refills | Status: DC | PRN

## 2022-09-04 ENCOUNTER — Telehealth: Payer: Self-pay | Admitting: *Deleted

## 2022-09-04 ENCOUNTER — Encounter: Payer: Self-pay | Admitting: Podiatry

## 2022-09-04 NOTE — Telephone Encounter (Signed)
Notes have been addended, faxed to [facility], patient updated thru [place].

## 2022-09-04 NOTE — Telephone Encounter (Signed)
[facility] is requesting office notes to specifically say that patient is unable to use a cane or walker faxed to complete wheelchair request, please fax to: [phone]: Helene Kelp

## 2022-09-06 ENCOUNTER — Ambulatory Visit (INDEPENDENT_AMBULATORY_CARE_PROVIDER_SITE_OTHER): Payer: Medicare Other | Admitting: Podiatry

## 2022-09-06 VITALS — BP 123/69 | HR 69

## 2022-09-06 DIAGNOSIS — S86011A Strain of right Achilles tendon, initial encounter: Secondary | ICD-10-CM

## 2022-09-06 NOTE — Progress Notes (Unsigned)
Patient presents today for post op visit # 2, patient of Dr. Posey Pronto.   POV # 2 DOS 08/17/22    Patient presents in their walking boot non-weightbearing. Patient currently using a wheelchair for mobility. Denies any falls or injury to the foot. Foot is swollen. No signs of infection. No calf pain or shortness of breath. Bandages dry and intact. Incision is intact. Sutures were removed today without complication. Patient tolerated suture removal well. Patient advised to continue to be non-weightbearing until advised by physical therapy. Patient verbalized understanding. Physical Therapy order place to [facility] today.   BP: 123/69 P: 69   Dr. Posey Pronto did take a look at her foot today as well.   Foot redressed today and placed back in the boot. Reviewed icing and elevation. Patient will follow up with Dr. Posey Pronto in 4 weeks for POV# 3.

## 2022-09-13 ENCOUNTER — Other Ambulatory Visit: Payer: Self-pay | Admitting: Podiatry

## 2022-09-13 DIAGNOSIS — S86011A Strain of right Achilles tendon, initial encounter: Secondary | ICD-10-CM

## 2022-09-18 ENCOUNTER — Encounter: Payer: Self-pay | Admitting: Podiatry

## 2022-09-19 ENCOUNTER — Encounter: Payer: Self-pay | Admitting: Podiatry

## 2022-09-24 ENCOUNTER — Telehealth: Payer: Self-pay | Admitting: *Deleted

## 2022-09-24 DIAGNOSIS — S86011A Strain of right Achilles tendon, initial encounter: Secondary | ICD-10-CM

## 2022-09-24 NOTE — Telephone Encounter (Signed)
Order faxed to [facility] for wheelchair 16-18 inch request, confirmation received..patient updated.

## 2022-09-24 NOTE — Telephone Encounter (Signed)
Order for wheelchair sent today to [place]

## 2022-09-26 DIAGNOSIS — M7660 Achilles tendinitis, unspecified leg: Secondary | ICD-10-CM | POA: Diagnosis not present

## 2022-10-04 ENCOUNTER — Ambulatory Visit (INDEPENDENT_AMBULATORY_CARE_PROVIDER_SITE_OTHER): Payer: Medicare Other | Admitting: Podiatry

## 2022-10-04 DIAGNOSIS — S86011A Strain of right Achilles tendon, initial encounter: Secondary | ICD-10-CM

## 2022-10-04 DIAGNOSIS — Z9889 Other specified postprocedural states: Secondary | ICD-10-CM

## 2022-10-04 NOTE — Progress Notes (Signed)
Subjective:  Patient ID: Jacqueline Mora, female    DOB: 04/28/1956,  MRN: [id number]  Chief Complaint  Patient presents with   Routine Post Op    DOS: 08/17/2022 Procedure: Right Achilles tendon repair primary  67 y.o. female returns for post-op check.  Patient states that she is doing well.  Denies any other acute complaints.  She is having some pain.  She has been nonweightbearing to the right lower extremity.  Denies any other acute complaints.  Review of Systems: Negative except as noted in the HPI. Denies N/V/F/Ch.  Past Medical History:  Diagnosis Date   Abdominal hernia    Anxiety    BMI 45.0-49.9, adult (HCC)    Cancer (HCC)    Colon cancer ([place])    COVID-19    Depression    GERD (gastroesophageal reflux disease)    History of colon polyps    Hypercholesteremia    Hypertension    Hypothyroidism    IBS (irritable bowel syndrome)    Prediabetes    Squamous cell carcinoma in situ (SCCIS) of skin    Thyroid disease    Vitamin D deficiency     Current Outpatient Medications:    ibuprofen (ADVIL) 800 MG tablet, Take 1 tablet (800 mg total) by mouth every 6 (six) hours as needed., Disp: 60 tablet, Rfl: 1   oxyCODONE-acetaminophen (PERCOCET) 5-325 MG tablet, Take 1 tablet by mouth every 4 (four) hours as needed for severe pain., Disp: 30 tablet, Rfl: 0   acetaminophen (TYLENOL) 500 MG tablet, Take 500 mg by mouth every 6 (six) hours as needed., Disp: , Rfl:    Alpha-Lipoic Acid 300 MG TABS, Take by mouth in the morning and at bedtime., Disp: , Rfl:    ALPRAZolam (XANAX) 0.5 MG tablet, Take 0.5 mg by mouth as needed for anxiety., Disp: , Rfl:    cholecalciferol (VITAMIN D) 1000 units tablet, Take 2,000 Units by mouth once a week. , Disp: , Rfl:    doxycycline (VIBRA-TABS) 100 MG tablet, Take 1 tablet (100 mg total) by mouth 2 (two) times daily., Disp: 28 tablet, Rfl: 0   doxycycline (VIBRAMYCIN) 100 MG capsule, Take 100 mg by mouth daily., Disp: , Rfl:    estradiol  (ESTRACE) 2 MG tablet, Take 2 mg by mouth daily., Disp: , Rfl:    ezetimibe (ZETIA) 10 MG tablet, Take 1 tablet (10 mg total) by mouth daily., Disp: 90 tablet, Rfl: 3   FLUoxetine (PROZAC) 10 MG capsule, Take 10 mg by mouth daily., Disp: , Rfl:    FLUoxetine (PROZAC) 20 MG capsule, Take 20 mg by mouth daily., Disp: , Rfl:    fluticasone (FLONASE) 50 MCG/ACT nasal spray, as needed., Disp: , Rfl: 5   furosemide (LASIX) 40 MG tablet, TAKE 1 TABLET(40 MG TOTAL) BY MOUTH DAILY, Disp: 90 tablet, Rfl: 2   ketoconazole (NIZORAL) 2 % cream, in the morning and at bedtime., Disp: , Rfl:    levothyroxine (SYNTHROID) 150 MCG tablet, Take 150 mcg by mouth daily before breakfast., Disp: , Rfl:    losartan (COZAAR) 100 MG tablet, Take 1 tablet by mouth daily., Disp: , Rfl:    meloxicam (MOBIC) 15 MG tablet, Take 1 tablet (15 mg total) by mouth daily., Disp: 30 tablet, Rfl: 0   methylPREDNISolone (MEDROL DOSEPAK) 4 MG TBPK tablet, Take as directed, Disp: 21 each, Rfl: 0   oxyCODONE-acetaminophen (PERCOCET) 5-325 MG tablet, Take 1 tablet by mouth every 4 (four) hours as needed for severe pain., Disp:  30 tablet, Rfl: 0   RABEprazole (ACIPHEX) 20 MG tablet, Take 20 mg by mouth daily., Disp: , Rfl:    spironolactone (ALDACTONE) 25 MG tablet, Take 1 tablet (25 mg total) by mouth daily., Disp: 90 tablet, Rfl: 3   Turmeric (QC TUMERIC COMPLEX PO), Take by mouth daily at 6 (six) AM., Disp: , Rfl:    valACYclovir (VALTREX) 500 MG tablet, , Disp: , Rfl:   Social History   Tobacco Use  Smoking Status Former   Types: Cigarettes  Smokeless Tobacco Never  Tobacco Comments   Quit 1984    Allergies  Allergen Reactions   Amlodipine Besylate Other (See Comments)    Dizziness   Clindamycin Hcl     Other reaction(s): rash   Eszopiclone     Other reaction(s): Crazy dreams   Penicillin G     Other reaction(s): rash   Tape     Adhesive on medical tape - requires paper tape.   Zocor [Simvastatin]    Objective:    There were no vitals filed for this visit.  There is no height or weight on file to calculate BMI. Constitutional Well developed. Well nourished.  Vascular Foot warm and well perfused. Capillary refill normal to all digits.   Neurologic Normal speech. Oriented to person, place, and time. Epicritic sensation to light touch grossly present bilaterally.  Dermatologic Skin healing well without signs of infection. Skin edges well coapted without signs of infection.  Good range of motion noted with plantarflexion of the ankle as well as dorsiflexion of the ankle.  4 out of 5 strength  Orthopedic: Tenderness to palpation noted about the surgical site.   Radiographs: None Assessment:   1. Rupture of right Achilles tendon, initial encounter    Plan:  Patient was evaluated and treated and all questions answered.  S/p foot surgery right -Progressing as expected post-operatively. -XR: Not -WB Status: Begin slowly weightbearing as tolerated in cam boot. -Sutures: None -Medications: None -Foot redressed. -Order for physical therapy home health was placed.  I would like for her to begin physical therapy.  She states understanding  No follow-ups on file.

## 2022-10-07 ENCOUNTER — Other Ambulatory Visit: Payer: Self-pay | Admitting: Internal Medicine

## 2022-10-07 DIAGNOSIS — Z4789 Encounter for other orthopedic aftercare: Secondary | ICD-10-CM | POA: Diagnosis not present

## 2022-10-07 DIAGNOSIS — F32A Depression, unspecified: Secondary | ICD-10-CM | POA: Diagnosis not present

## 2022-10-07 DIAGNOSIS — K219 Gastro-esophageal reflux disease without esophagitis: Secondary | ICD-10-CM | POA: Diagnosis not present

## 2022-10-07 DIAGNOSIS — Z872 Personal history of diseases of the skin and subcutaneous tissue: Secondary | ICD-10-CM | POA: Diagnosis not present

## 2022-10-07 DIAGNOSIS — K589 Irritable bowel syndrome without diarrhea: Secondary | ICD-10-CM | POA: Diagnosis not present

## 2022-10-07 DIAGNOSIS — Z86010 Personal history of colonic polyps: Secondary | ICD-10-CM | POA: Diagnosis not present

## 2022-10-07 DIAGNOSIS — F419 Anxiety disorder, unspecified: Secondary | ICD-10-CM | POA: Diagnosis not present

## 2022-10-07 DIAGNOSIS — Z8616 Personal history of COVID-19: Secondary | ICD-10-CM | POA: Diagnosis not present

## 2022-10-07 DIAGNOSIS — Z87891 Personal history of nicotine dependence: Secondary | ICD-10-CM | POA: Diagnosis not present

## 2022-10-07 DIAGNOSIS — I10 Essential (primary) hypertension: Secondary | ICD-10-CM | POA: Diagnosis not present

## 2022-10-07 DIAGNOSIS — R7303 Prediabetes: Secondary | ICD-10-CM | POA: Diagnosis not present

## 2022-10-07 DIAGNOSIS — E7800 Pure hypercholesterolemia, unspecified: Secondary | ICD-10-CM | POA: Diagnosis not present

## 2022-10-07 DIAGNOSIS — E039 Hypothyroidism, unspecified: Secondary | ICD-10-CM | POA: Diagnosis not present

## 2022-10-08 ENCOUNTER — Other Ambulatory Visit: Payer: Self-pay

## 2022-10-08 MED ORDER — SPIRONOLACTONE 25 MG PO TABS: 25.0000 mg | ORAL_TABLET | Freq: Every day | ORAL | 2 refills | Status: DC

## 2022-10-15 DIAGNOSIS — E039 Hypothyroidism, unspecified: Secondary | ICD-10-CM | POA: Diagnosis not present

## 2022-10-15 DIAGNOSIS — K589 Irritable bowel syndrome without diarrhea: Secondary | ICD-10-CM | POA: Diagnosis not present

## 2022-10-15 DIAGNOSIS — F419 Anxiety disorder, unspecified: Secondary | ICD-10-CM | POA: Diagnosis not present

## 2022-10-15 DIAGNOSIS — Z872 Personal history of diseases of the skin and subcutaneous tissue: Secondary | ICD-10-CM | POA: Diagnosis not present

## 2022-10-15 DIAGNOSIS — R7303 Prediabetes: Secondary | ICD-10-CM | POA: Diagnosis not present

## 2022-10-15 DIAGNOSIS — Z8616 Personal history of COVID-19: Secondary | ICD-10-CM | POA: Diagnosis not present

## 2022-10-15 DIAGNOSIS — K219 Gastro-esophageal reflux disease without esophagitis: Secondary | ICD-10-CM | POA: Diagnosis not present

## 2022-10-15 DIAGNOSIS — Z87891 Personal history of nicotine dependence: Secondary | ICD-10-CM | POA: Diagnosis not present

## 2022-10-15 DIAGNOSIS — E7800 Pure hypercholesterolemia, unspecified: Secondary | ICD-10-CM | POA: Diagnosis not present

## 2022-10-15 DIAGNOSIS — Z4789 Encounter for other orthopedic aftercare: Secondary | ICD-10-CM | POA: Diagnosis not present

## 2022-10-15 DIAGNOSIS — Z86010 Personal history of colonic polyps: Secondary | ICD-10-CM | POA: Diagnosis not present

## 2022-10-15 DIAGNOSIS — I10 Essential (primary) hypertension: Secondary | ICD-10-CM | POA: Diagnosis not present

## 2022-10-15 DIAGNOSIS — F32A Depression, unspecified: Secondary | ICD-10-CM | POA: Diagnosis not present

## 2022-10-17 DIAGNOSIS — K589 Irritable bowel syndrome without diarrhea: Secondary | ICD-10-CM | POA: Diagnosis not present

## 2022-10-17 DIAGNOSIS — Z4789 Encounter for other orthopedic aftercare: Secondary | ICD-10-CM | POA: Diagnosis not present

## 2022-10-17 DIAGNOSIS — E039 Hypothyroidism, unspecified: Secondary | ICD-10-CM | POA: Diagnosis not present

## 2022-10-17 DIAGNOSIS — F32A Depression, unspecified: Secondary | ICD-10-CM | POA: Diagnosis not present

## 2022-10-17 DIAGNOSIS — Z86010 Personal history of colonic polyps: Secondary | ICD-10-CM | POA: Diagnosis not present

## 2022-10-17 DIAGNOSIS — Z872 Personal history of diseases of the skin and subcutaneous tissue: Secondary | ICD-10-CM | POA: Diagnosis not present

## 2022-10-17 DIAGNOSIS — R7303 Prediabetes: Secondary | ICD-10-CM | POA: Diagnosis not present

## 2022-10-17 DIAGNOSIS — K219 Gastro-esophageal reflux disease without esophagitis: Secondary | ICD-10-CM | POA: Diagnosis not present

## 2022-10-17 DIAGNOSIS — Z87891 Personal history of nicotine dependence: Secondary | ICD-10-CM | POA: Diagnosis not present

## 2022-10-17 DIAGNOSIS — E7800 Pure hypercholesterolemia, unspecified: Secondary | ICD-10-CM | POA: Diagnosis not present

## 2022-10-17 DIAGNOSIS — Z8616 Personal history of COVID-19: Secondary | ICD-10-CM | POA: Diagnosis not present

## 2022-10-17 DIAGNOSIS — F419 Anxiety disorder, unspecified: Secondary | ICD-10-CM | POA: Diagnosis not present

## 2022-10-17 DIAGNOSIS — I10 Essential (primary) hypertension: Secondary | ICD-10-CM | POA: Diagnosis not present

## 2022-10-23 DIAGNOSIS — Z8616 Personal history of COVID-19: Secondary | ICD-10-CM | POA: Diagnosis not present

## 2022-10-23 DIAGNOSIS — K219 Gastro-esophageal reflux disease without esophagitis: Secondary | ICD-10-CM | POA: Diagnosis not present

## 2022-10-23 DIAGNOSIS — Z872 Personal history of diseases of the skin and subcutaneous tissue: Secondary | ICD-10-CM | POA: Diagnosis not present

## 2022-10-23 DIAGNOSIS — Z4789 Encounter for other orthopedic aftercare: Secondary | ICD-10-CM | POA: Diagnosis not present

## 2022-10-23 DIAGNOSIS — E039 Hypothyroidism, unspecified: Secondary | ICD-10-CM | POA: Diagnosis not present

## 2022-10-23 DIAGNOSIS — K589 Irritable bowel syndrome without diarrhea: Secondary | ICD-10-CM | POA: Diagnosis not present

## 2022-10-23 DIAGNOSIS — E7800 Pure hypercholesterolemia, unspecified: Secondary | ICD-10-CM | POA: Diagnosis not present

## 2022-10-23 DIAGNOSIS — R7303 Prediabetes: Secondary | ICD-10-CM | POA: Diagnosis not present

## 2022-10-23 DIAGNOSIS — F419 Anxiety disorder, unspecified: Secondary | ICD-10-CM | POA: Diagnosis not present

## 2022-10-23 DIAGNOSIS — I10 Essential (primary) hypertension: Secondary | ICD-10-CM | POA: Diagnosis not present

## 2022-10-23 DIAGNOSIS — F32A Depression, unspecified: Secondary | ICD-10-CM | POA: Diagnosis not present

## 2022-10-23 DIAGNOSIS — Z87891 Personal history of nicotine dependence: Secondary | ICD-10-CM | POA: Diagnosis not present

## 2022-10-23 DIAGNOSIS — Z86010 Personal history of colonic polyps: Secondary | ICD-10-CM | POA: Diagnosis not present

## 2022-10-30 DIAGNOSIS — M67813 Other specified disorders of tendon, right shoulder: Secondary | ICD-10-CM | POA: Diagnosis not present

## 2022-10-31 DIAGNOSIS — K589 Irritable bowel syndrome without diarrhea: Secondary | ICD-10-CM | POA: Diagnosis not present

## 2022-10-31 DIAGNOSIS — R7303 Prediabetes: Secondary | ICD-10-CM | POA: Diagnosis not present

## 2022-10-31 DIAGNOSIS — Z4789 Encounter for other orthopedic aftercare: Secondary | ICD-10-CM | POA: Diagnosis not present

## 2022-10-31 DIAGNOSIS — E7800 Pure hypercholesterolemia, unspecified: Secondary | ICD-10-CM | POA: Diagnosis not present

## 2022-10-31 DIAGNOSIS — I10 Essential (primary) hypertension: Secondary | ICD-10-CM | POA: Diagnosis not present

## 2022-10-31 DIAGNOSIS — Z8616 Personal history of COVID-19: Secondary | ICD-10-CM | POA: Diagnosis not present

## 2022-10-31 DIAGNOSIS — K219 Gastro-esophageal reflux disease without esophagitis: Secondary | ICD-10-CM | POA: Diagnosis not present

## 2022-10-31 DIAGNOSIS — E039 Hypothyroidism, unspecified: Secondary | ICD-10-CM | POA: Diagnosis not present

## 2022-10-31 DIAGNOSIS — Z86010 Personal history of colonic polyps: Secondary | ICD-10-CM | POA: Diagnosis not present

## 2022-10-31 DIAGNOSIS — Z872 Personal history of diseases of the skin and subcutaneous tissue: Secondary | ICD-10-CM | POA: Diagnosis not present

## 2022-10-31 DIAGNOSIS — F419 Anxiety disorder, unspecified: Secondary | ICD-10-CM | POA: Diagnosis not present

## 2022-10-31 DIAGNOSIS — F32A Depression, unspecified: Secondary | ICD-10-CM | POA: Diagnosis not present

## 2022-10-31 DIAGNOSIS — Z87891 Personal history of nicotine dependence: Secondary | ICD-10-CM | POA: Diagnosis not present

## 2022-11-05 DIAGNOSIS — K080 Exfoliation of teeth due to systemic causes: Secondary | ICD-10-CM | POA: Diagnosis not present

## 2022-11-06 DIAGNOSIS — Z872 Personal history of diseases of the skin and subcutaneous tissue: Secondary | ICD-10-CM | POA: Diagnosis not present

## 2022-11-06 DIAGNOSIS — Z87891 Personal history of nicotine dependence: Secondary | ICD-10-CM | POA: Diagnosis not present

## 2022-11-06 DIAGNOSIS — Z8616 Personal history of COVID-19: Secondary | ICD-10-CM | POA: Diagnosis not present

## 2022-11-06 DIAGNOSIS — Z86010 Personal history of colonic polyps: Secondary | ICD-10-CM | POA: Diagnosis not present

## 2022-11-06 DIAGNOSIS — K589 Irritable bowel syndrome without diarrhea: Secondary | ICD-10-CM | POA: Diagnosis not present

## 2022-11-06 DIAGNOSIS — F32A Depression, unspecified: Secondary | ICD-10-CM | POA: Diagnosis not present

## 2022-11-06 DIAGNOSIS — I10 Essential (primary) hypertension: Secondary | ICD-10-CM | POA: Diagnosis not present

## 2022-11-06 DIAGNOSIS — E039 Hypothyroidism, unspecified: Secondary | ICD-10-CM | POA: Diagnosis not present

## 2022-11-06 DIAGNOSIS — E7800 Pure hypercholesterolemia, unspecified: Secondary | ICD-10-CM | POA: Diagnosis not present

## 2022-11-06 DIAGNOSIS — K219 Gastro-esophageal reflux disease without esophagitis: Secondary | ICD-10-CM | POA: Diagnosis not present

## 2022-11-06 DIAGNOSIS — Z4789 Encounter for other orthopedic aftercare: Secondary | ICD-10-CM | POA: Diagnosis not present

## 2022-11-06 DIAGNOSIS — F419 Anxiety disorder, unspecified: Secondary | ICD-10-CM | POA: Diagnosis not present

## 2022-11-06 DIAGNOSIS — R7303 Prediabetes: Secondary | ICD-10-CM | POA: Diagnosis not present

## 2022-11-08 ENCOUNTER — Ambulatory Visit (INDEPENDENT_AMBULATORY_CARE_PROVIDER_SITE_OTHER): Payer: Medicare Other | Admitting: Podiatry

## 2022-11-08 ENCOUNTER — Encounter: Payer: Self-pay | Admitting: Podiatry

## 2022-11-08 DIAGNOSIS — S86011A Strain of right Achilles tendon, initial encounter: Secondary | ICD-10-CM

## 2022-11-08 NOTE — Progress Notes (Signed)
Subjective:  Patient ID: Jacqueline Mora, female    DOB: April 14, 1956,  MRN: [id number]  Chief Complaint  Patient presents with   Routine Post Op    Pt stated that she is doing better     DOS: 08/17/2022 Procedure: Right Achilles tendon repair primary  67 y.o. female returns for post-op check.  Patient states that she is doing well.  Denies any other acute complaints.  She is states that her pain is mild but overall much better denies any other acute complaints.  Review of Systems: Negative except as noted in the HPI. Denies N/V/F/Ch.  Past Medical History:  Diagnosis Date   Abdominal hernia    Anxiety    BMI 45.0-49.9, adult (HCC)    Cancer (HCC)    Colon cancer ([place])    COVID-19    Depression    GERD (gastroesophageal reflux disease)    History of colon polyps    Hypercholesteremia    Hypertension    Hypothyroidism    IBS (irritable bowel syndrome)    Prediabetes    Squamous cell carcinoma in situ (SCCIS) of skin    Thyroid disease    Vitamin D deficiency     Current Outpatient Medications:    ibuprofen (ADVIL) 800 MG tablet, Take 1 tablet (800 mg total) by mouth every 6 (six) hours as needed., Disp: 60 tablet, Rfl: 1   oxyCODONE-acetaminophen (PERCOCET) 5-325 MG tablet, Take 1 tablet by mouth every 4 (four) hours as needed for severe pain., Disp: 30 tablet, Rfl: 0   acetaminophen (TYLENOL) 500 MG tablet, Take 500 mg by mouth every 6 (six) hours as needed., Disp: , Rfl:    Alpha-Lipoic Acid 300 MG TABS, Take by mouth in the morning and at bedtime., Disp: , Rfl:    ALPRAZolam (XANAX) 0.5 MG tablet, Take 0.5 mg by mouth as needed for anxiety., Disp: , Rfl:    cholecalciferol (VITAMIN D) 1000 units tablet, Take 2,000 Units by mouth once a week. , Disp: , Rfl:    doxycycline (VIBRA-TABS) 100 MG tablet, Take 1 tablet (100 mg total) by mouth 2 (two) times daily., Disp: 28 tablet, Rfl: 0   doxycycline (VIBRAMYCIN) 100 MG capsule, Take 100 mg by mouth daily., Disp: , Rfl:     estradiol (ESTRACE) 2 MG tablet, Take 2 mg by mouth daily., Disp: , Rfl:    ezetimibe (ZETIA) 10 MG tablet, Take 1 tablet (10 mg total) by mouth daily., Disp: 90 tablet, Rfl: 3   FLUoxetine (PROZAC) 10 MG capsule, Take 10 mg by mouth daily., Disp: , Rfl:    FLUoxetine (PROZAC) 20 MG capsule, Take 20 mg by mouth daily., Disp: , Rfl:    fluticasone (FLONASE) 50 MCG/ACT nasal spray, as needed., Disp: , Rfl: 5   furosemide (LASIX) 40 MG tablet, TAKE 1 TABLET(40 MG TOTAL) BY MOUTH DAILY, Disp: 90 tablet, Rfl: 2   ketoconazole (NIZORAL) 2 % cream, in the morning and at bedtime., Disp: , Rfl:    levothyroxine (SYNTHROID) 150 MCG tablet, Take 150 mcg by mouth daily before breakfast., Disp: , Rfl:    losartan (COZAAR) 100 MG tablet, Take 1 tablet by mouth daily., Disp: , Rfl:    meloxicam (MOBIC) 15 MG tablet, Take 1 tablet (15 mg total) by mouth daily., Disp: 30 tablet, Rfl: 0   methylPREDNISolone (MEDROL DOSEPAK) 4 MG TBPK tablet, Take as directed, Disp: 21 each, Rfl: 0   oxyCODONE-acetaminophen (PERCOCET) 5-325 MG tablet, Take 1 tablet by mouth every 4 (four)  hours as needed for severe pain., Disp: 30 tablet, Rfl: 0   oxyCODONE-acetaminophen (PERCOCET) 5-325 MG tablet, Take 1 tablet by mouth every 4 (four) hours as needed for severe pain., Disp: 30 tablet, Rfl: 0   RABEprazole (ACIPHEX) 20 MG tablet, Take 20 mg by mouth daily., Disp: , Rfl:    spironolactone (ALDACTONE) 25 MG tablet, TAKE ONE TABLET BY MOUTH DAILY DOSE INCREASE, Disp: 90 tablet, Rfl: 1   Turmeric (QC TUMERIC COMPLEX PO), Take by mouth daily at 6 (six) AM., Disp: , Rfl:    valACYclovir (VALTREX) 500 MG tablet, , Disp: , Rfl:   Social History   Tobacco Use  Smoking Status Former   Types: Cigarettes  Smokeless Tobacco Never  Tobacco Comments   Quit 1984    Allergies  Allergen Reactions   Amlodipine Besylate Other (See Comments)    Dizziness   Clindamycin Hcl     Other reaction(s): rash   Eszopiclone     Other reaction(s):  Crazy dreams   Penicillin G     Other reaction(s): rash   Tape     Adhesive on medical tape - requires paper tape.   Zocor [Simvastatin]    Objective:   There were no vitals filed for this visit.  There is no height or weight on file to calculate BMI. Constitutional Well developed. Well nourished.  Vascular Foot warm and well perfused. Capillary refill normal to all digits.   Neurologic Normal speech. Oriented to person, place, and time. Epicritic sensation to light touch grossly present bilaterally.  Dermatologic Skin healing well without signs of infection. Skin edges well coapted without signs of infection.  Good range of motion noted with plantarflexion of the ankle as well as dorsiflexion of the ankle.  4 out of 5 strength  Orthopedic: Mild tenderness to palpation noted about the surgical site.   Radiographs: None Assessment:   1. Rupture of right Achilles tendon, initial encounter    Plan:  Patient was evaluated and treated and all questions answered.  S/p foot surgery right -Clinically overall patient is progressing really well.  She still has intact range of motion of the Achilles tendon.  She is not completely at 100% strength.  Will continue physical therapy -Clinically physical therapy has been helping.  We will plan on doing 6 more weeks of physical therapy at that point patient should be able to return back to regular activity  No follow-ups on file.    Will do 6 more weeks of outpatient physical therapy

## 2022-11-09 ENCOUNTER — Other Ambulatory Visit: Payer: Self-pay | Admitting: Podiatry

## 2022-11-09 MED ORDER — OXYCODONE-ACETAMINOPHEN 5-325 MG PO TABS: 1.0000 | ORAL_TABLET | ORAL | 0 refills | Status: DC | PRN

## 2022-11-22 DIAGNOSIS — M7661 Achilles tendinitis, right leg: Secondary | ICD-10-CM | POA: Diagnosis not present

## 2022-11-27 DIAGNOSIS — M7661 Achilles tendinitis, right leg: Secondary | ICD-10-CM | POA: Diagnosis not present

## 2022-11-28 DIAGNOSIS — R7303 Prediabetes: Secondary | ICD-10-CM | POA: Diagnosis not present

## 2022-11-28 DIAGNOSIS — Z0000 Encounter for general adult medical examination without abnormal findings: Secondary | ICD-10-CM | POA: Diagnosis not present

## 2022-11-28 DIAGNOSIS — E559 Vitamin D deficiency, unspecified: Secondary | ICD-10-CM | POA: Diagnosis not present

## 2022-11-28 DIAGNOSIS — D508 Other iron deficiency anemias: Secondary | ICD-10-CM | POA: Diagnosis not present

## 2022-11-28 DIAGNOSIS — E7800 Pure hypercholesterolemia, unspecified: Secondary | ICD-10-CM | POA: Diagnosis not present

## 2022-11-28 DIAGNOSIS — F419 Anxiety disorder, unspecified: Secondary | ICD-10-CM | POA: Diagnosis not present

## 2022-11-28 DIAGNOSIS — E039 Hypothyroidism, unspecified: Secondary | ICD-10-CM | POA: Diagnosis not present

## 2022-11-29 DIAGNOSIS — M7661 Achilles tendinitis, right leg: Secondary | ICD-10-CM | POA: Diagnosis not present

## 2022-11-29 LAB — LAB REPORT - SCANNED
A1c: 5.8
EGFR: 57

## 2022-11-30 ENCOUNTER — Telehealth: Payer: Self-pay | Admitting: Internal Medicine

## 2022-11-30 DIAGNOSIS — R0609 Other forms of dyspnea: Secondary | ICD-10-CM

## 2022-11-30 DIAGNOSIS — R072 Precordial pain: Secondary | ICD-10-CM

## 2022-11-30 NOTE — Telephone Encounter (Signed)
Pt c/o of Chest Pain: STAT if CP now or developed within 24 hours  1. Are you having CP right now?   No  2. Are you experiencing any other symptoms (ex. SOB, nausea, vomiting, sweating)?   SOB  3. How long have you been experiencing CP?   Started having tightness toward the end of last year  4. Is your CP continuous or coming and going?  Coming and going  5. Have you taken Nitroglycerin?   No  Patient stated she has had an Achilles tendon injury and has had medication changes.  Patient is concerned medication changes may been contributing to the tightness in her chest.  Patient stated she has since had weight gain due to not being able to get around.  Patient stated she had blood work done at her PCP on 4/10 and will have the results forwarded.   ?

## 2022-11-30 NOTE — Telephone Encounter (Signed)
Called pt in regards to CP/SOB.  Pt reports CP/ chest tightness has occurred since the end of last year.  SOB pt feels is r/t achilles tendon rupture that has resulted in immobility for the past 2.5 months.  Has gained 15 lbs. Pt is now finally able to move around and notices SOB with little exertion.   Reports BP fluctuates normal to high.  Had OV with PCP on Wednesday 130/80's.  Has swelling but thinks it could be d/t inability to tolerate antiinflammatory medication prescribed. Is taking spironolactone and furosemide as ordered.  Advised pt will send message to MD to advise.

## 2022-12-03 MED ORDER — METOPROLOL TARTRATE 100 MG PO TABS: ORAL_TABLET | ORAL | 0 refills | Status: DC

## 2022-12-03 NOTE — Telephone Encounter (Signed)
Prior patient with HTN and HFpEF - no long on norvasc for dizziness NOS - Ddx is CAD (negative stress test prior); HTN (priorBP issues off GLP-1 therapy, HFpEF, or deconditioning - recommend CCTA; then needs f/u with me or our team.  Thanks, MAC   Pt is agreeable to have CCTA.  Reviewed instructions also placed on my chart for pt to review.  Reports had labs with PCP.

## 2022-12-06 DIAGNOSIS — M7661 Achilles tendinitis, right leg: Secondary | ICD-10-CM | POA: Diagnosis not present

## 2022-12-07 ENCOUNTER — Telehealth (HOSPITAL_COMMUNITY): Payer: Self-pay | Admitting: *Deleted

## 2022-12-07 NOTE — Telephone Encounter (Signed)
Reaching out to patient to offer assistance regarding upcoming cardiac imaging study; pt verbalizes understanding of appt date/time, parking situation and where to check in, pre-test NPO status and medications ordered, and verified current allergies; name and call back number provided for further questions should they arise  Avelino Herren RN Navigator Cardiac Imaging [facility] [phone] office [phone] cell  Patient to take 100mg metoprolol tartrate two hours prior to her cardiac CT scan.  She is aware to arrive at 3pm. 

## 2022-12-10 ENCOUNTER — Ambulatory Visit (HOSPITAL_COMMUNITY)
Admission: RE | Admit: 2022-12-10 | Discharge: 2022-12-10 | Disposition: A | Discharge status: Home / Self Care | Payer: Medicare Other | Source: Ambulatory Visit | Attending: Internal Medicine | Admitting: Internal Medicine

## 2022-12-10 DIAGNOSIS — R072 Precordial pain: Secondary | ICD-10-CM | POA: Diagnosis not present

## 2022-12-10 DIAGNOSIS — R0609 Other forms of dyspnea: Secondary | ICD-10-CM | POA: Insufficient documentation

## 2022-12-10 MED ORDER — NITROGLYCERIN 0.4 MG SL SUBL
0.8000 mg | SUBLINGUAL_TABLET | Freq: Once | SUBLINGUAL | Status: AC
  Administered 2022-12-10: 0.8 mg via SUBLINGUAL

## 2022-12-10 MED ORDER — NITROGLYCERIN 0.4 MG SL SUBL
SUBLINGUAL_TABLET | SUBLINGUAL | Status: AC
  Filled 2022-12-10: qty 2

## 2022-12-10 MED ORDER — IOHEXOL 350 MG/ML SOLN
100.0000 mL | Freq: Once | INTRAVENOUS | Status: AC | PRN
  Administered 2022-12-10: 100 mL via INTRAVENOUS

## 2022-12-12 ENCOUNTER — Encounter: Payer: Self-pay | Admitting: Internal Medicine

## 2022-12-18 DIAGNOSIS — M7661 Achilles tendinitis, right leg: Secondary | ICD-10-CM | POA: Diagnosis not present

## 2022-12-20 DIAGNOSIS — M7661 Achilles tendinitis, right leg: Secondary | ICD-10-CM | POA: Diagnosis not present

## 2022-12-20 DIAGNOSIS — M25511 Pain in right shoulder: Secondary | ICD-10-CM | POA: Diagnosis not present

## 2022-12-20 DIAGNOSIS — M67813 Other specified disorders of tendon, right shoulder: Secondary | ICD-10-CM | POA: Diagnosis not present

## 2022-12-20 DIAGNOSIS — M62838 Other muscle spasm: Secondary | ICD-10-CM | POA: Diagnosis not present

## 2022-12-24 DIAGNOSIS — M25511 Pain in right shoulder: Secondary | ICD-10-CM | POA: Diagnosis not present

## 2022-12-24 DIAGNOSIS — M7661 Achilles tendinitis, right leg: Secondary | ICD-10-CM | POA: Diagnosis not present

## 2022-12-27 DIAGNOSIS — M7661 Achilles tendinitis, right leg: Secondary | ICD-10-CM | POA: Diagnosis not present

## 2022-12-28 ENCOUNTER — Ambulatory Visit: Payer: Medicare Other | Admitting: Podiatry

## 2022-12-28 DIAGNOSIS — M7661 Achilles tendinitis, right leg: Secondary | ICD-10-CM | POA: Diagnosis not present

## 2022-12-28 DIAGNOSIS — M722 Plantar fascial fibromatosis: Secondary | ICD-10-CM | POA: Diagnosis not present

## 2022-12-28 NOTE — Progress Notes (Signed)
Subjective:  Patient ID: Jacqueline Mora, female    DOB: 15-May-1956,  MRN: [id number]  Chief Complaint  Patient presents with   Routine Post Op    Pt stated that she has some slight discomfort in her heel     DOS: 08/17/2022 Procedure: Right Achilles tendon repair primary  67 y.o. female returns for post-op check.  Patient presents for follow-up, previous history of ankle surgery.  She states she is doing a lot better has been in physical therapy.  Her improvement is improved considerably.  She is walking anywhere.  She wanted to get it evaluated for her son. Review of Systems: Negative except as noted in the HPI. Denies N/V/F/Ch.  Past Medical History:  Diagnosis Date   Abdominal hernia    Anxiety    BMI 45.0-49.9, adult (HCC)    Cancer (HCC)    Colon cancer (HCC)    COVID-19    Depression    GERD (gastroesophageal reflux disease)    History of colon polyps    Hypercholesteremia    Hypertension    Hypothyroidism    IBS (irritable bowel syndrome)    Prediabetes    Squamous cell carcinoma in situ (SCCIS) of skin    Thyroid disease    Vitamin D deficiency     Current Outpatient Medications:    ibuprofen (ADVIL) 800 MG tablet, Take 1 tablet (800 mg total) by mouth every 6 (six) hours as needed., Disp: 60 tablet, Rfl: 1   oxyCODONE-acetaminophen (PERCOCET) 5-325 MG tablet, Take 1 tablet by mouth every 4 (four) hours as needed for severe pain., Disp: 30 tablet, Rfl: 0   acetaminophen (TYLENOL) 500 MG tablet, Take 500 mg by mouth every 6 (six) hours as needed., Disp: , Rfl:    Alpha-Lipoic Acid 300 MG TABS, Take by mouth in the morning and at bedtime., Disp: , Rfl:    ALPRAZolam (XANAX) 0.5 MG tablet, Take 0.5 mg by mouth as needed for anxiety., Disp: , Rfl:    cholecalciferol (VITAMIN D) 1000 units tablet, Take 2,000 Units by mouth once a week. , Disp: , Rfl:    doxycycline (VIBRA-TABS) 100 MG tablet, Take 1 tablet (100 mg total) by mouth 2 (two) times daily., Disp: 28 tablet,  Rfl: 0   doxycycline (VIBRAMYCIN) 100 MG capsule, Take 100 mg by mouth daily., Disp: , Rfl:    estradiol (ESTRACE) 2 MG tablet, Take 2 mg by mouth daily., Disp: , Rfl:    ezetimibe (ZETIA) 10 MG tablet, Take 1 tablet (10 mg total) by mouth daily., Disp: 90 tablet, Rfl: 3   FLUoxetine (PROZAC) 10 MG capsule, Take 10 mg by mouth daily., Disp: , Rfl:    FLUoxetine (PROZAC) 20 MG capsule, Take 20 mg by mouth daily., Disp: , Rfl:    fluticasone (FLONASE) 50 MCG/ACT nasal spray, as needed., Disp: , Rfl: 5   furosemide (LASIX) 40 MG tablet, TAKE 1 TABLET(40 MG TOTAL) BY MOUTH DAILY, Disp: 90 tablet, Rfl: 2   ketoconazole (NIZORAL) 2 % cream, in the morning and at bedtime., Disp: , Rfl:    levothyroxine (SYNTHROID) 150 MCG tablet, Take 150 mcg by mouth daily before breakfast., Disp: , Rfl:    losartan (COZAAR) 100 MG tablet, Take 1 tablet by mouth daily., Disp: , Rfl:    meloxicam (MOBIC) 15 MG tablet, Take 1 tablet (15 mg total) by mouth daily., Disp: 30 tablet, Rfl: 0   methylPREDNISolone (MEDROL DOSEPAK) 4 MG TBPK tablet, Take as directed, Disp: 21 each, Rfl:  0   metoprolol tartrate (LOPRESSOR) 100 MG tablet, Take 2 hours prior to Cardiac CT, Disp: 1 tablet, Rfl: 0   oxyCODONE-acetaminophen (PERCOCET) 5-325 MG tablet, Take 1 tablet by mouth every 4 (four) hours as needed for severe pain., Disp: 30 tablet, Rfl: 0   oxyCODONE-acetaminophen (PERCOCET) 5-325 MG tablet, Take 1 tablet by mouth every 4 (four) hours as needed for severe pain., Disp: 30 tablet, Rfl: 0   RABEprazole (ACIPHEX) 20 MG tablet, Take 20 mg by mouth daily., Disp: , Rfl:    spironolactone (ALDACTONE) 25 MG tablet, TAKE ONE TABLET BY MOUTH DAILY DOSE INCREASE, Disp: 90 tablet, Rfl: 1   Turmeric (QC TUMERIC COMPLEX PO), Take by mouth daily at 6 (six) AM., Disp: , Rfl:    valACYclovir (VALTREX) 500 MG tablet, , Disp: , Rfl:   Social History   Tobacco Use  Smoking Status Former   Types: Cigarettes  Smokeless Tobacco Never  Tobacco  Comments   Quit 1984    Allergies  Allergen Reactions   Amlodipine Besylate Other (See Comments)    Dizziness   Clindamycin Hcl     Other reaction(s): rash   Eszopiclone     Other reaction(s): Crazy dreams   Penicillin G     Other reaction(s): rash   Tape     Adhesive on medical tape - requires paper tape.   Zocor [Simvastatin]    Objective:   There were no vitals filed for this visit.  There is no height or weight on file to calculate BMI. Constitutional Well developed. Well nourished.  Vascular Foot warm and well perfused. Capillary refill normal to all digits.   Neurologic Normal speech. Oriented to person, place, and time. Epicritic sensation to light touch grossly present bilaterally.  Dermatologic Skin healing well without signs of infection. Skin edges well coapted without signs of infection.  Good range of motion noted with plantarflexion of the ankle as well as dorsiflexion of the ankle.  4 out of 5 strength  Orthopedic: Mild tenderness to palpation noted about the surgical site.   Radiographs: None Assessment:   No diagnosis found.  Plan:  Patient was evaluated and treated and all questions answered.  S/p foot surgery right -Clinically healing well and overall improved considerably.  Continue physical therapy will do 6 few more sessions.  At this time I discussed to return her in regular shoes.  As she appears comfortable.   Right Planter fasciitis -I explained to the patient the etiology of Planter fasciitis and worse treatment options were discussed.  Given that she is hurting is likely compensation from the Achilles tendinitis however she will benefit from a steroid injection to help decrease inflammatory component associate with pain.  Patient agrees would like to proceed with steroid injection. -A steroid injection was performed at right plantar fascia using 1% plain Lidocaine and 10 mg of Kenalog. This was well tolerated.

## 2023-01-01 DIAGNOSIS — K469 Unspecified abdominal hernia without obstruction or gangrene: Secondary | ICD-10-CM | POA: Diagnosis not present

## 2023-01-01 DIAGNOSIS — R739 Hyperglycemia, unspecified: Secondary | ICD-10-CM | POA: Diagnosis not present

## 2023-01-02 ENCOUNTER — Other Ambulatory Visit (HOSPITAL_COMMUNITY): Payer: Self-pay | Admitting: Family Medicine

## 2023-01-02 DIAGNOSIS — K469 Unspecified abdominal hernia without obstruction or gangrene: Secondary | ICD-10-CM

## 2023-01-03 DIAGNOSIS — M7661 Achilles tendinitis, right leg: Secondary | ICD-10-CM | POA: Diagnosis not present

## 2023-01-03 DIAGNOSIS — M25511 Pain in right shoulder: Secondary | ICD-10-CM | POA: Diagnosis not present

## 2023-01-04 ENCOUNTER — Ambulatory Visit (HOSPITAL_BASED_OUTPATIENT_CLINIC_OR_DEPARTMENT_OTHER)
Admission: RE | Admit: 2023-01-04 | Discharge: 2023-01-04 | Disposition: A | Discharge status: Home / Self Care | Payer: Medicare Other | Source: Ambulatory Visit | Attending: Family Medicine | Admitting: Family Medicine

## 2023-01-04 DIAGNOSIS — R109 Unspecified abdominal pain: Secondary | ICD-10-CM | POA: Diagnosis not present

## 2023-01-04 DIAGNOSIS — K469 Unspecified abdominal hernia without obstruction or gangrene: Secondary | ICD-10-CM | POA: Insufficient documentation

## 2023-01-08 ENCOUNTER — Encounter: Payer: Self-pay | Admitting: Podiatry

## 2023-01-08 DIAGNOSIS — M25511 Pain in right shoulder: Secondary | ICD-10-CM | POA: Diagnosis not present

## 2023-01-10 DIAGNOSIS — L304 Erythema intertrigo: Secondary | ICD-10-CM | POA: Diagnosis not present

## 2023-01-10 DIAGNOSIS — L570 Actinic keratosis: Secondary | ICD-10-CM | POA: Diagnosis not present

## 2023-01-10 DIAGNOSIS — L578 Other skin changes due to chronic exposure to nonionizing radiation: Secondary | ICD-10-CM | POA: Diagnosis not present

## 2023-01-17 DIAGNOSIS — M67813 Other specified disorders of tendon, right shoulder: Secondary | ICD-10-CM | POA: Diagnosis not present

## 2023-01-17 DIAGNOSIS — M62838 Other muscle spasm: Secondary | ICD-10-CM | POA: Diagnosis not present

## 2023-01-22 DIAGNOSIS — M25511 Pain in right shoulder: Secondary | ICD-10-CM | POA: Diagnosis not present

## 2023-01-25 ENCOUNTER — Ambulatory Visit: Payer: Medicare Other | Admitting: Podiatry

## 2023-01-28 ENCOUNTER — Encounter: Payer: Self-pay | Admitting: Surgery

## 2023-01-28 ENCOUNTER — Ambulatory Visit: Payer: Self-pay | Admitting: Surgery

## 2023-01-28 ENCOUNTER — Telehealth: Payer: Self-pay

## 2023-01-28 DIAGNOSIS — K430 Incisional hernia with obstruction, without gangrene: Secondary | ICD-10-CM | POA: Diagnosis not present

## 2023-01-28 DIAGNOSIS — Z85038 Personal history of other malignant neoplasm of large intestine: Secondary | ICD-10-CM | POA: Insufficient documentation

## 2023-01-28 NOTE — Telephone Encounter (Signed)
   Name: Jacqueline Mora  DOB: 04-09-1956  MRN: [id number]  Primary Cardiologist: Christell Constant, MD  Chart reviewed as part of pre-operative protocol coverage. Because of Jacqueline Mora's past medical history and time since last visit, she will require a follow-up in-office visit in order to better assess preoperative cardiovascular risk.  Pre-op covering staff: - Please schedule appointment and call patient to inform them. If patient already had an upcoming appointment within acceptable timeframe, please add "pre-op clearance" to the appointment notes so provider is aware. - Please contact requesting surgeon's office via preferred method (i.e, phone, fax) to inform them of need for appointment prior to surgery.  No medications indicated as needing held.   Sharlene Dory, PA-C  01/28/2023, 1:15 PM

## 2023-01-28 NOTE — Telephone Encounter (Signed)
   Pre-operative Risk Assessment    Patient Name: Jacqueline Mora  DOB: 12-22-1955 MRN: [id number]     Request for Surgical Clearance    Procedure:  Hernia Surgery   Date of Surgery:  Clearance   TBD                               Surgeon:  Dr. Karie Soda  Surgeon's Group or Practice Name:  [facility]  Phone number:  [phone] Fax number:  [phone]   Type of Clearance Requested:   - Medical    Type of Anesthesia:  General    Additional requests/questions:    SignedVernard Gambles   01/28/2023, 11:49 AM

## 2023-01-28 NOTE — Telephone Encounter (Signed)
PT IS SCHEDULED 06/19 AT 1:55 PM WITH eRNEST dICK, np. I WILL FORWARD TO HIM.

## 2023-02-05 ENCOUNTER — Encounter: Payer: Self-pay | Admitting: Podiatry

## 2023-02-05 NOTE — Progress Notes (Unsigned)
Office Visit    Patient Name: Jacqueline Mora Date of Encounter: 02/05/2023  Primary Care Provider:  Jarrett Soho, PA-C Primary Cardiologist:  Christell Constant, MD Primary Electrophysiologist: None   Past Medical History    Past Medical History:  Diagnosis Date   Abdominal hernia    Anxiety    BMI 45.0-49.9, adult (HCC)    Cancer (HCC)    Colon cancer (HCC)    COVID-19    Depression    GERD (gastroesophageal reflux disease)    History of colon polyps    Hypercholesteremia    Hypertension    Hypothyroidism    IBS (irritable bowel syndrome)    Prediabetes    Squamous cell carcinoma in situ (SCCIS) of skin    Thyroid disease    Vitamin D deficiency    Past Surgical History:  Procedure Laterality Date   ABDOMINAL HYSTERECTOMY     CESAREAN SECTION     x 2   colon cancer     COLONOSCOPY     HERNIA REPAIR     Mesh placed then later removed due to infection.   laparostomy      Allergies  Allergies  Allergen Reactions   Amlodipine Besylate Other (See Comments)    Dizziness   Clindamycin Hcl     Other reaction(s): rash   Eszopiclone     Other reaction(s): Crazy dreams   Penicillin G     Other reaction(s): rash   Tape     Adhesive on medical tape - requires paper tape.   Zocor [Simvastatin]      History of Present Illness    Jacqueline Mora  is a 67 year old female with a PMH of HTN, HLD, GERD, prediabetes who presents today for preoperative clearance.  Ms. dowler was seen initially by Dr. Raynelle Jan on 12/08/2020 for evaluation of chest pain.  She had a 2D echo completed that showed EF of 60 to 65% with no R WMA and grade 2 DD with mildly dilated LA and mild calcification of the AV.  She was started on low-dose Lasix and losartan was increased but continued to have chest pain and was sent for [facility] that was low risk and normal.  She was last seen by Dr. Raynelle Jan on 03/12/2022 no med changes made at that time and patient  underwent coronary CTA on 12/10/2022 that showed calcium score of 54 with noncalcified plaque noted in left dominant coronaries with minimal plaque in the LAD and circumflex.  Since last being seen in the office patient reports***.  Patient denies chest pain, palpitations, dyspnea, PND, orthopnea, nausea, vomiting, dizziness, syncope, edema, weight gain, or early satiety.   ***Notes: Pending hernia surgery Home Medications    Current Outpatient Medications  Medication Sig Dispense Refill   ibuprofen (ADVIL) 800 MG tablet Take 1 tablet (800 mg total) by mouth every 6 (six) hours as needed. 60 tablet 1   oxyCODONE-acetaminophen (PERCOCET) 5-325 MG tablet Take 1 tablet by mouth every 4 (four) hours as needed for severe pain. 30 tablet 0   acetaminophen (TYLENOL) 500 MG tablet Take 500 mg by mouth every 6 (six) hours as needed.     Alpha-Lipoic Acid 300 MG TABS Take by mouth in the morning and at bedtime.     ALPRAZolam (XANAX) 0.5 MG tablet Take 0.5 mg by mouth as needed for anxiety.     cholecalciferol (VITAMIN D) 1000 units tablet Take 2,000 Units by mouth once a week.  doxycycline (VIBRA-TABS) 100 MG tablet Take 1 tablet (100 mg total) by mouth 2 (two) times daily. 28 tablet 0   doxycycline (VIBRAMYCIN) 100 MG capsule Take 100 mg by mouth daily.     estradiol (ESTRACE) 2 MG tablet Take 2 mg by mouth daily.     ezetimibe (ZETIA) 10 MG tablet Take 1 tablet (10 mg total) by mouth daily. 90 tablet 3   FLUoxetine (PROZAC) 10 MG capsule Take 10 mg by mouth daily.     FLUoxetine (PROZAC) 20 MG capsule Take 20 mg by mouth daily.     fluticasone (FLONASE) 50 MCG/ACT nasal spray as needed.  5   furosemide (LASIX) 40 MG tablet TAKE 1 TABLET(40 MG TOTAL) BY MOUTH DAILY 90 tablet 2   ketoconazole (NIZORAL) 2 % cream in the morning and at bedtime.     levothyroxine (SYNTHROID) 150 MCG tablet Take 150 mcg by mouth daily before breakfast.     losartan (COZAAR) 100 MG tablet Take 1 tablet by mouth  daily.     meloxicam (MOBIC) 15 MG tablet Take 1 tablet (15 mg total) by mouth daily. 30 tablet 0   methylPREDNISolone (MEDROL DOSEPAK) 4 MG TBPK tablet Take as directed 21 each 0   metoprolol tartrate (LOPRESSOR) 100 MG tablet Take 2 hours prior to Cardiac CT 1 tablet 0   oxyCODONE-acetaminophen (PERCOCET) 5-325 MG tablet Take 1 tablet by mouth every 4 (four) hours as needed for severe pain. 30 tablet 0   oxyCODONE-acetaminophen (PERCOCET) 5-325 MG tablet Take 1 tablet by mouth every 4 (four) hours as needed for severe pain. 30 tablet 0   RABEprazole (ACIPHEX) 20 MG tablet Take 20 mg by mouth daily.     spironolactone (ALDACTONE) 25 MG tablet TAKE ONE TABLET BY MOUTH DAILY DOSE INCREASE 90 tablet 1   Turmeric (QC TUMERIC COMPLEX PO) Take by mouth daily at 6 (six) AM.     valACYclovir (VALTREX) 500 MG tablet      No current facility-administered medications for this visit.     Review of Systems  Please see the history of present illness.    (+)*** (+)***  All other systems reviewed and are otherwise negative except as noted above.  Physical Exam    Wt Readings from Last 3 Encounters:  03/12/22 285 lb (129.3 kg)  10/16/21 286 lb 12.8 oz (130.1 kg)  09/21/21 294 lb (133.4 kg)   ZO:XWRUE were no vitals filed for this visit.,There is no height or weight on file to calculate BMI.  Constitutional:      Appearance: Healthy appearance. Not in distress.  Neck:     Vascular: JVD normal.  Pulmonary:     Effort: Pulmonary effort is normal.     Breath sounds: No wheezing. No rales. Diminished in the bases Cardiovascular:     Normal rate. Regular rhythm. Normal S1. Normal S2.      Murmurs: There is no murmur.  Edema:    Peripheral edema absent.  Abdominal:     Palpations: Abdomen is soft non tender. There is no hepatomegaly.  Skin:    General: Skin is warm and dry.  Neurological:     General: No focal deficit present.     Mental Status: Alert and oriented to person, place and time.      Cranial Nerves: Cranial nerves are intact.  EKG/LABS/ Recent Cardiac Studies    ECG personally reviewed by me today - ***  Cardiac Studies & Procedures     STRESS TESTS  MYOCARDIAL PERFUSION IMAGING 05/11/2021  Narrative   The study is normal. The study is low risk.   LV perfusion is normal.   Left ventricular function is normal. Nuclear stress EF: 58 %. The left ventricular ejection fraction is normal (55-65%). End diastolic cavity size is normal.   Prior study not available for comparison.  Low risk stress nuclear study with normal perfusion and normal left ventricular regional and global systolic function.   ECHOCARDIOGRAM  ECHOCARDIOGRAM COMPLETE 01/09/2021  Narrative ECHOCARDIOGRAM REPORT    Patient Name:   TRUBY KILSON Date of Exam: 01/09/2021 Medical Rec #:  [id number]       Height:       66.0 in Accession #:    [id number]      Weight:       292.0 lb Date of Birth:  05-20-1956      BSA:          2.349 m Patient Age:    64 years        BP:           140/70 mmHg Patient Gender: F               HR:           64 bpm. Exam Location:  [address]  Procedure: 2D Echo, Cardiac Doppler and Color Doppler  Indications:    R06.00 SOB  History:        Patient has no prior history of Echocardiogram examinations. Signs/Symptoms:Shortness of Breath and Chest Pain; Risk Factors:Morbid obesity, Hypertension, Dyslipidemia and Former Smoker.  Sonographer:    Samule Ohm RDCS Referring Phys: [id number] [facility] A Izora Ribas   Sonographer Comments: Technically difficult study due to poor echo windows and patient is morbidly obese. Image acquisition challenging due to patient body habitus and Image acquisition challenging due to respiratory motion. IMPRESSIONS   1. Left ventricular ejection fraction, by estimation, is 60 to 65%. The left ventricle has normal function. The left ventricle has no regional wall motion abnormalities. There is mild left ventricular  hypertrophy. Left ventricular diastolic parameters are consistent with Grade II diastolic dysfunction (pseudonormalization). Elevated left ventricular end-diastolic pressure. 2. Right ventricular systolic function is normal. The right ventricular size is normal. 3. Left atrial size was mildly dilated. 4. The mitral valve is abnormal. Trivial mitral valve regurgitation. No evidence of mitral stenosis. 5. The aortic valve was not well visualized. There is mild calcification of the aortic valve. Aortic valve regurgitation is not visualized. Mild aortic valve sclerosis is present, with no evidence of aortic valve stenosis. 6. The inferior vena cava is normal in size with greater than 50% respiratory variability, suggesting right atrial pressure of 3 mmHg.  FINDINGS Left Ventricle: Left ventricular ejection fraction, by estimation, is 60 to 65%. The left ventricle has normal function. The left ventricle has no regional wall motion abnormalities. The left ventricular internal cavity size was normal in size. There is mild left ventricular hypertrophy. Left ventricular diastolic parameters are consistent with Grade II diastolic dysfunction (pseudonormalization). Elevated left ventricular end-diastolic pressure.  Right Ventricle: The right ventricular size is normal. No increase in right ventricular wall thickness. Right ventricular systolic function is normal.  Left Atrium: Left atrial size was mildly dilated.  Right Atrium: Right atrial size was normal in size.  Pericardium: There is no evidence of pericardial effusion.  Mitral Valve: The mitral valve is abnormal. There is mild thickening of the mitral valve leaflet(s). There is mild calcification of the mitral valve  leaflet(s). Mild mitral annular calcification. Trivial mitral valve regurgitation. No evidence of mitral valve stenosis.  Tricuspid Valve: The tricuspid valve is normal in structure. Tricuspid valve regurgitation is mild . No evidence of  tricuspid stenosis.  Aortic Valve: The aortic valve was not well visualized. There is mild calcification of the aortic valve. Aortic valve regurgitation is not visualized. Mild aortic valve sclerosis is present, with no evidence of aortic valve stenosis.  Pulmonic Valve: The pulmonic valve was normal in structure. Pulmonic valve regurgitation is not visualized. No evidence of pulmonic stenosis.  Aorta: The aortic root is normal in size and structure.  Venous: The inferior vena cava is normal in size with greater than 50% respiratory variability, suggesting right atrial pressure of 3 mmHg.  IAS/Shunts: No atrial level shunt detected by color flow Doppler.   LEFT VENTRICLE PLAX 2D LVIDd:         4.40 cm  Diastology LVIDs:         3.00 cm  LV e' medial:    5.77 cm/s LV PW:         1.00 cm  LV E/e' medial:  23.2 LV IVS:        1.30 cm  LV e' lateral:   6.42 cm/s LVOT diam:     2.10 cm  LV E/e' lateral: 20.9 LV SV:         86 LV SV Index:   37 LVOT Area:     3.46 cm   RIGHT VENTRICLE            IVC RVSP:           32.2 mmHg  IVC diam: 1.50 cm  LEFT ATRIUM             Index       RIGHT ATRIUM           Index LA diam:        4.10 cm 1.75 cm/m  RA Pressure: 3.00 mmHg LA Vol (A2C):   56.6 ml 24.10 ml/m RA Area:     13.70 cm LA Vol (A4C):   53.2 ml 22.65 ml/m RA Volume:   35.80 ml  15.24 ml/m LA Biplane Vol: 55.5 ml 23.63 ml/m AORTIC VALVE LVOT Vmax:   97.90 cm/s LVOT Vmean:  65.800 cm/s LVOT VTI:    0.249 m  AORTA Ao Root diam: 3.10 cm Ao Asc diam:  2.90 cm  MV E velocity: 134.00 cm/s  TRICUSPID VALVE MV A velocity: 108.00 cm/s  TR Peak grad:   29.2 mmHg MV E/A ratio:  1.24         TR Vmax:        270.00 cm/s Estimated RAP:  3.00 mmHg RVSP:           32.2 mmHg  SHUNTS Systemic VTI:  0.25 m Systemic Diam: 2.10 cm  Charlton Haws MD Electronically signed by Charlton Haws MD Signature Date/Time: 01/09/2021/2:33:53 PM    Final     CT SCANS  CT CORONARY MORPH W/CTA  COR W/SCORE 12/14/2022  Addendum 12/14/2022  6:15 PM ADDENDUM REPORT: 12/14/2022 18:13  EXAM: OVER-READ INTERPRETATION  CT CHEST  The following report is an over-read performed by radiologist Dr. Narda Rutherford of [facility] on 12/14/2022. This over-read does not include interpretation of cardiac or coronary anatomy or pathology. The coronary CTA interpretation by the cardiologist is attached.  COMPARISON:  None.  FINDINGS: Vascular: Minor aortic atherosclerosis. The included aorta is normal in caliber.  Mediastinum/nodes: No adenopathy or mass. Unremarkable esophagus.  Lungs: No focal airspace disease. No pulmonary nodule. No pleural fluid. Mild bronchial thickening.  Upper abdomen: No acute or unexpected findings.  Musculoskeletal: There are no acute or suspicious osseous abnormalities.  IMPRESSION: Mild bronchial thickening.   Electronically Signed By: Narda Rutherford M.D. On: 12/14/2022 18:13  Narrative CLINICAL DATA:  69F with hypertension, hyperlipidemia and chest pain.  EXAM: Cardiac/Coronary  CT  TECHNIQUE: The patient was scanned on a [employer].  FINDINGS: A 120 kV prospective scan was triggered in the descending thoracic aorta at 111 HU's. Axial non-contrast 3 mm slices were carried out through the heart. The data set was analyzed on a dedicated work station and scored using the Agatson method. Gantry rotation speed was 250 msecs and collimation was .6 mm. No beta blockade and 0.8 mg of sl NTG was given. The 3D data set was reconstructed in 5% intervals of the 67-82 % of the R-R cycle. Diastolic phases were analyzed on a dedicated work station using MPR, MIP and VRT modes. The patient received 80 cc of contrast.  Aorta: Normal size.  No calcifications.  No dissection.  Aortic Valve:  Trileaflet.  No calcifications.  Coronary Arteries:  Normal coronary origin.  Right dominance.  RCA is a small non-dominant artery.   There is no plaque.  Left main is a large artery that gives rise to LAD, RI, and LCX arteries.  LAD is a large vessel that has minimal (<25%) calcified plaque in the mid LAD. D1 and D2 have no plaque.  RI has no plaque.  LCX is a dominant artery that has minimal (<25%) soft plaque at the ostium. It gives rise to OM1, OM2 and L-PDA without plaque.  Coronary Calcium Score:  Left main: 0  Left anterior descending artery: 54  Left circumflex artery: 0  Right coronary artery: 0  Total: 54  Percentile: 70th  Other findings:  Normal pulmonary vein drainage into the left atrium.  Normal let atrial appendage without a thrombus.  Normal size of the pulmonary artery.  IMPRESSION: 1. Coronary calcium score of 54. This was 70th percentile for age-, sex, and race-matched controls.  2. Total plaque volume 83 mm3 which is 32nd percentile for age- and sex-matched controls (calcified plaque 7 mm3; non-calcified plaque 76 mm3). TPV is mild.  2. Normal coronary origin with left dominance.  3. There is minimal (<25%) plaque in the LAD and LCX.  CAD-RADS 1.  RECOMMENDATIONS: 1. CAD-RADS 0: No evidence of CAD (0%). Consider non-atherosclerotic causes of chest pain.  2. CAD-RADS 1: Minimal non-obstructive CAD (0-24%). Consider non-atherosclerotic causes of chest pain. Consider preventive therapy and risk factor modification.  3. CAD-RADS 2: Mild non-obstructive CAD (25-49%). Consider non-atherosclerotic causes of chest pain. Consider preventive therapy and risk factor modification.  4. CAD-RADS 3: Moderate stenosis. Consider symptom-guided anti-ischemic pharmacotherapy as well as risk factor modification per guideline directed care. Additional analysis with CT FFR will be submitted.  5. CAD-RADS 4: Severe stenosis. (70-99% or > 50% left main). Cardiac catheterization or CT FFR is recommended. Consider symptom-guided anti-ischemic pharmacotherapy as well as risk factor  modification per guideline directed care. Invasive coronary angiography recommended with revascularization per published guideline statements.  6. CAD-RADS 5: Total coronary occlusion (100%). Consider cardiac catheterization or viability assessment. Consider symptom-guided anti-ischemic pharmacotherapy as well as risk factor modification per guideline directed care.  7. CAD-RADS N: Non-diagnostic study. Obstructive CAD can't be excluded. Alternative evaluation is recommended.  Chilton Si,  MD  Electronically Signed: By: Chilton Si M.D. On: 12/10/2022 18:19          Risk Assessment/Calculations:   {Does this patient have ATRIAL FIBRILLATION?:[phone]}        No results found for: "WBC", "HGB", "HCT", "MCV", "PLT" Lab Results  Component Value Date   CREATININE 0.90 10/16/2021   BUN 15 10/16/2021   NA 138 10/16/2021   K 4.6 10/16/2021   CL 102 10/16/2021   CO2 23 10/16/2021   Lab Results  Component Value Date   ALT 10 03/29/2022   Lab Results  Component Value Date   CHOL 181 03/29/2022   HDL 53 03/29/2022   LDLCALC 101 (H) 03/29/2022   TRIG 154 (H) 03/29/2022   CHOLHDL 3.4 03/29/2022    No results found for: "HGBA1C"   Assessment & Plan    1.  Preoperative clearance: -Patient is RCRI score is 0.9%  2.  Nonobstructive CAD: -s/p coronary CT completed with calcium score of 50 with less than 5% plaque in the LAD and the circumflex  3.  Hyperlipidemia: -Patient's LDL cholesterol was***  4.Essential hypertension: -Patient's blood pressure today was***      Disposition: Follow-up with Christell Constant, MD or APP in *** months {Are you ordering a CV Procedure (e.g. stress test, cath, DCCV, TEE, etc)?   Press F2        :[id number]}   Medication Adjustments/Labs and Tests Ordered: Current medicines are reviewed at length with the patient today.  Concerns regarding medicines are outlined above.   Signed, Napoleon Form, Leodis Rains,  NP 02/05/2023, 9:28 PM [facility]

## 2023-02-06 ENCOUNTER — Ambulatory Visit: Payer: Medicare Other | Attending: Nurse Practitioner | Admitting: Nurse Practitioner

## 2023-02-06 VITALS — BP 134/82 | HR 66 | Ht 66.0 in | Wt 291.6 lb

## 2023-02-06 DIAGNOSIS — I10 Essential (primary) hypertension: Secondary | ICD-10-CM | POA: Diagnosis not present

## 2023-02-06 DIAGNOSIS — I2510 Atherosclerotic heart disease of native coronary artery without angina pectoris: Secondary | ICD-10-CM | POA: Diagnosis not present

## 2023-02-06 DIAGNOSIS — Z01810 Encounter for preprocedural cardiovascular examination: Secondary | ICD-10-CM | POA: Diagnosis not present

## 2023-02-06 DIAGNOSIS — E785 Hyperlipidemia, unspecified: Secondary | ICD-10-CM | POA: Diagnosis not present

## 2023-02-06 NOTE — Patient Instructions (Addendum)
Medication Instructions:  Your physician recommends that you continue on your current medications as directed. Please refer to the Current Medication list given to you today. *If you need a refill on your cardiac medications before your next appointment, please call your pharmacy*   Lab Work: None ordered   Testing/Procedures: None ordered   Follow-Up: At [facility], you and your health needs are our priority.  As part of our continuing mission to provide you with exceptional heart care, we have created designated Provider Care Teams.  These Care Teams include your primary Cardiologist (physician) and Advanced Practice Providers (APPs -  Physician Assistants and Nurse Practitioners) who all work together to provide you with the care you need, when you need it.  We recommend signing up for the patient portal called "MyChart".  Sign up information is provided on this After Visit Summary.  MyChart is used to connect with patients for Virtual Visits (Telemedicine).  Patients are able to view lab/test results, encounter notes, upcoming appointments, etc.  Non-urgent messages can be sent to your provider as well.   To learn more about what you can do with MyChart, go to [email or web address].    Your next appointment:   12 month(s)  Provider:   Christell Constant, MD     Other Instructions  You have been referred to PharmD [facility] & [email or web address]) YOU ARE CLEARED FOR YOUR PROCEDURE

## 2023-02-07 ENCOUNTER — Encounter: Payer: Self-pay | Admitting: Nurse Practitioner

## 2023-02-12 DIAGNOSIS — Z0100 Encounter for examination of eyes and vision without abnormal findings: Secondary | ICD-10-CM | POA: Diagnosis not present

## 2023-02-16 ENCOUNTER — Encounter: Payer: Self-pay | Admitting: Podiatry

## 2023-02-19 NOTE — Progress Notes (Signed)
Patient ID: Jacqueline Mora                 DOB: December 02, 1955                    MRN: [id number]     HPI: Jacqueline Mora is a 67 y.o. female patient of Dr Izora Ribas referred to PharmD by Robin Searing, NP for lipid and weight management. PMH is significant for coronary CTA 12/10/22 with calcium score of 54 (70th percentile), HTN, HLD, preDM, obesity, colon cancer, GERD, and IBS.  Pt reports tolerating Zetia well. Previously experienced cramping on simvastatin. Was changed to rosuvastatin, had some aches, dose was decreased to 10mg  which she tolerated well. Med was stopped because her cholesterol was controlled.  PCP has tried to get her on GLP for a while but insurance kept denying request due to [employer] and no DM. Used Saxenda samples last year and lost 15 lbs. Tolerated well aside from some mild nausea.  Has had a rough year since she shredded her achilles tendon, unable to move for 3 months after. Still not allowed to exercise fully, also has hernia that she's undergoing surgery for in August. Has been eating lightly but trouble losing weight due to constraints of inactivity. Did lose 10 lbs over 4 months. Previously used a Systems analyst but stopped when she pulled her hernia.  Current Medications: Zetia 10mg  daily Intolerances: simvastatin - muscle pain in legs Risk Factors: elevated calcium score LDL goal: 70mg /dL  Diet: Skips breakfast, sometimes some fruit. Cup of hot tea in the AM. Main meal around 2pm - chicken, salmon, broccoli, no carbs. Skips dinner sometimes. Snacks - 100 calorie popcorn bags or fruit. Drinks water or unsweet tea. Once a week sips on a diet, caffeine free Coke.  Family History: Mother with kidney cancer, father with DM.  Social History: Occasional alcohol use, no tobacco or drug use.  Labs: 11/29/22: A1c 5.8%              TC 199, TG 154, HDL 54, LDL 118, nonHDL 145 - Zetia 10mg  daily  Past Medical History:  Diagnosis Date   Abdominal hernia     Anxiety    BMI 45.0-49.9, adult (HCC)    Cancer (HCC)    Colon cancer (HCC)    COVID-19    Depression    GERD (gastroesophageal reflux disease)    History of colon polyps    Hypercholesteremia    Hypertension    Hypothyroidism    IBS (irritable bowel syndrome)    Prediabetes    Squamous cell carcinoma in situ (SCCIS) of skin    Thyroid disease    Vitamin D deficiency     Current Outpatient Medications on File Prior to Visit  Medication Sig Dispense Refill   ALPRAZolam (XANAX) 0.5 MG tablet Take 0.5 mg by mouth as needed for anxiety.     cholecalciferol (VITAMIN D) 1000 units tablet Take 2,000 Units by mouth once a week.      estradiol (ESTRACE) 2 MG tablet Take 1 mg by mouth daily.     ezetimibe (ZETIA) 10 MG tablet Take 1 tablet (10 mg total) by mouth daily. 90 tablet 3   FLUoxetine (PROZAC) 10 MG capsule Take 10 mg by mouth daily.     FLUoxetine (PROZAC) 20 MG capsule Take 20 mg by mouth daily.     fluticasone (FLONASE) 50 MCG/ACT nasal spray as needed.  5   furosemide (LASIX) 40 MG tablet  TAKE 1 TABLET(40 MG TOTAL) BY MOUTH DAILY 90 tablet 2   ketoconazole (NIZORAL) 2 % cream in the morning and at bedtime.     levothyroxine (SYNTHROID) 150 MCG tablet Take 150 mcg by mouth daily before breakfast.     losartan (COZAAR) 100 MG tablet Take 1 tablet by mouth daily.     RABEprazole (ACIPHEX) 20 MG tablet Take 20 mg by mouth daily.     spironolactone (ALDACTONE) 25 MG tablet TAKE ONE TABLET BY MOUTH DAILY DOSE INCREASE 90 tablet 1   No current facility-administered medications on file prior to visit.    Allergies  Allergen Reactions   Amlodipine Besylate Other (See Comments)    Dizziness   Clindamycin Hcl     Other reaction(s): rash   Eszopiclone     Other reaction(s): Crazy dreams   Penicillin G     Other reaction(s): rash   Tape     Adhesive on medical tape - requires paper tape.   Zocor [Simvastatin]    Meloxicam Rash   Methylprednisolone Rash     Assessment/Plan:  1. Hyperlipidemia - LDL 118 on Zetia 10mg  daily above goal < 70 due to elevated calcium score. Prior myalgias on simvastatin and higher dose of rosuvastatin. Tolerated rosuvastatin 10mg  daily well. Discussed better plaque regression and CV benefit with statin therapy over Zetia therapy. Pt wants to minimize her meds if possible. Will resume rosuvastatin 10mg  daily and stop her Zetia 10mg  daily. As noted below, with expected weight loss from GLP therapy, should improve her cholesterol too. She will have PCP recheck lipids in ~3 months. Can resume Zetia if needed at that time.  2. Obesity - BMI 46. Pt eats a very healthy diet. Currently unable to exercise due to achilles tendon injury, also pending hernia surgery. Previously unable to start GLP due to insurance denial (no DM, Medicare doesn't cover weight loss meds). I submitted authorization for [facility] under new CV indication which was approved through 02/19/24. Copay is $249/month which pt is ok with. She is very Adult nurse for assistance with insurance to get med approval. Reviewed recommendation to eat smaller more frequent portions throughout the day and stay active as able. She does have IBS and GERD, also pending hernia surgery. Aware of GI side effects, tolerated Saxenda sample well last year which is encouraging. I'll call pt monthly for tolerability updates and dose titrations. She is also aware she'll need to hold a dose before her upcoming hernia surgery.  Decarlo Rivet E. Jhoselin Crume, PharmD, BCACP, CPP [facility] [address] [place], [place] [address] Phone: [phone]; Fax: [phone] 02/20/2023 4:35 PM

## 2023-02-20 ENCOUNTER — Other Ambulatory Visit (HOSPITAL_COMMUNITY): Payer: Self-pay

## 2023-02-20 ENCOUNTER — Ambulatory Visit: Payer: Medicare Other | Attending: Internal Medicine | Admitting: Pharmacist

## 2023-02-20 DIAGNOSIS — R931 Abnormal findings on diagnostic imaging of heart and coronary circulation: Secondary | ICD-10-CM

## 2023-02-20 DIAGNOSIS — E785 Hyperlipidemia, unspecified: Secondary | ICD-10-CM | POA: Diagnosis not present

## 2023-02-20 MED ORDER — WEGOVY 0.25 MG/0.5ML ~~LOC~~ SOAJ: 0.2500 mg | SUBCUTANEOUS | 0 refills | Status: DC

## 2023-02-20 MED ORDER — ROSUVASTATIN CALCIUM 10 MG PO TABS: 10.0000 mg | ORAL_TABLET | Freq: Every day | ORAL | 3 refills | Status: DC

## 2023-02-20 NOTE — Patient Instructions (Addendum)
Your LDL cholesterol was 118 in April and your goal is < 70 because of your elevated calcium score  Start taking rosuvastatin 10mg  daily  Stop taking ezetimibe 10mg  daily  Have your PCP recheck your cholesterol in ~3 months  I'll call you in a month to follow up with your [facility] and increase your dose  [facility] Counseling Points This medication reduces your appetite and may make you feel fuller longer.  Stop eating when your body tells you that you are full. This will likely happen sooner than you are used to. Fried/greasy food and sweets may upset your stomach - minimize these as much as possible. Store your medication in the fridge until you are ready to use it. Inject your medication in the fatty tissue of your lower abdominal area (2 inches away from belly button) or upper outer thigh. Rotate injection sites. Common side effects include: nausea, diarrhea/constipation, and heartburn, and are more likely to occur if you overeat. Stop your injection for 7 days prior to surgical procedures requiring anesthesia.  Tips for success: Write down the reasons why you want to lose weight and post it in a place where you'll see it often.  Start small and work your way up. Keep in mind that it takes time to achieve goals, and small steps add up.  Any additional movements help to burn calories. Taking the stairs rather than the elevator and parking at the far end of your parking lot are easy ways to start. Brisk walking for at least 30 minutes 4 or more days of the week is an excellent goal to work toward  Understanding what it means to feel full: Did you know that it can take 15 minutes or more for your brain to receive the message that you've eaten? That means that, if you eat less food, but consume it slower, you may still feel satisfied.  Eating a lot of fruits and vegetables can also help you feel fuller.  Eat off of smaller plates so that moderate portions don't seem too small  Tips for  living a healthier life     Building a Healthy and Balanced Diet Make most of your meal vegetables and fruits -  of your plate. Aim for color and variety, and remember that potatoes don't count as vegetables on the Healthy Eating Plate because of their negative impact on blood sugar.  Go for whole grains -  of your plate. Whole and intact grains--whole wheat, barley, wheat berries, quinoa, oats, brown rice, and foods made with them, such as whole wheat pasta--have a milder effect on blood sugar and insulin than white bread, white rice, and other refined grains.  Protein power -  of your plate. Fish, poultry, beans, and nuts are all healthy, versatile protein sources--they can be mixed into salads, and pair well with vegetables on a plate. Limit red meat, and avoid processed meats such as bacon and sausage.  Healthy plant oils - in moderation. Choose healthy vegetable oils like olive, canola, soy, corn, sunflower, peanut, and others, and avoid partially hydrogenated oils, which contain unhealthy trans fats. Remember that low-fat does not mean "healthy."  Drink water, coffee, or tea. Skip sugary drinks, limit milk and dairy products to one to two servings per day, and limit juice to a small glass per day.  Stay active. The red figure running across the Healthy Eating Plate's placemat is a reminder that staying active is also important in weight control.  The main message of the Healthy  Eating Plate is to focus on diet quality:  The type of carbohydrate in the diet is more important than the amount of carbohydrate in the diet, because some sources of carbohydrate--like vegetables (other than potatoes), fruits, whole grains, and beans--are healthier than others. The Healthy Eating Plate also advises consumers to avoid sugary beverages, a major source of calories--usually with little nutritional value--in the American diet. The Healthy Eating Plate encourages consumers to use healthy oils,  and it does not set a maximum on the percentage of calories people should get each day from healthy sources of fat. In this way, the Healthy Eating Plate recommends the opposite of the low-fat message promoted for decades by the USDA.  [employer]  SUGAR  Sugar is a huge problem in the modern day diet. Sugar is a big contributor to heart disease, diabetes, high triglyceride levels, fatty liver disease and obesity. Sugar is hidden in almost all packaged foods/beverages. Added sugar is extra sugar that is added beyond what is naturally found and has no nutritional benefit for your body. The American Heart Association recommends limiting added sugars to no more than 25g for women and 36 grams for men per day. There are many names for sugar including maltose, sucrose (names ending in "ose"), high fructose corn syrup, molasses, cane sugar, corn sweetener, raw sugar, syrup, honey or fruit juice concentrate.   One of the best ways to limit your added sugars is to stop drinking sweetened beverages such as soda, sweet tea, and fruit juice.  There is 65g of added sugars in one 20oz bottle of Coke! That is equal to 7.5 donuts.   Pay attention and read all nutrition facts labels. Below is an examples of a nutrition facts label. The #1 is showing you the total sugars where the # 2 is showing you the added sugars. This one serving has almost the max amount of added sugars per day!   EXERCISE  Exercise is good. We've all heard that. In an ideal world, we would all have time and resources to get plenty of it. When you are active, your heart pumps more efficiently and you will feel better.  Multiple studies show that even walking regularly has benefits that include living a longer life. The American Heart Association recommends 150 minutes per week of exercise (30 minutes per day most days of the week). You can do this in any increment you wish. Nine or more  10-minute walks count. So does an hour-long exercise class. Break the time apart into what will work in your life. Some of the best things you can do include walking briskly, jogging, cycling or swimming laps. Not everyone is ready to "exercise." Sometimes we need to start with just getting active. Here are some easy ways to be more active throughout the day:  Take the stairs instead of the elevator  Go for a 10-15 minute walk during your lunch break (find a friend to make it more enjoyable)  When shopping, park at the back of the parking lot  If you take public transportation, get off one stop early and walk the extra distance  Pace around while making phone calls  Check with your doctor if you aren't sure what your limitations may be. Always remember to drink plenty of water when doing any type of exercise. Don't feel like a failure if you're not getting the 90-150 minutes per week. If you started by being a couch potato, then just a 10-minute walk each day is a  huge improvement. Start with little victories and work your way up.   HEALTHY EATING TIPS              Plan ahead: make a menu of the meals for a week then create a grocery list to go with that menu. Consider meals that easily stretch into a night of leftovers, such as stews or casseroles. Or consider making two of your favorite meal and put one in the freezer for another night. Try a night or two each week that is "meatless" or "no cook" such as salads. When you get home from the grocery store wash and prepare your vegetables and fruits. Then when you need them they are ready to go.   Tips for going to the grocery store:  Buy store or generic brands  Check the weekly ad from your store on-line or in their in-store flyer  Look at the unit price on the shelf tag to compare/contrast the costs of different items  Buy fruits/vegetables in season  Carrots, bananas and apples are low-cost, naturally healthy items  If meats or frozen vegetables  are on sale, buy some extras and put in your freezer  Limit buying prepared or "ready to eat" items, even if they are pre-made salads or fruit snacks  Do not shop when you're hungry  Foods at eye level tend to be more expensive. Look on the high and low shelves for deals.  Consider shopping at the farmer's market for fresh foods in season.  Avoid the cookie and chip aisles (these are expensive, high in calories and low in nutritional value). Shop on the outside of the grocery store.  Healthy food preparations:  If you can't get lean hamburger, be sure to drain the fat when cooking  Steam, saut (in olive oil), grill or bake foods  Experiment with different seasonings to avoid adding salt to your foods. Kosher salt, sea salt and Himalayan salt are all still salt and should be avoided. Try seasoning food with onion, garlic, thyme, rosemary, basil ect. Onion powder or garlic powder is ok. Avoid if it says salt (ie garlic salt).

## 2023-02-22 ENCOUNTER — Encounter: Payer: Self-pay | Admitting: Pharmacist

## 2023-03-06 ENCOUNTER — Ambulatory Visit: Payer: Medicare Other | Admitting: Podiatry

## 2023-03-13 DIAGNOSIS — L578 Other skin changes due to chronic exposure to nonionizing radiation: Secondary | ICD-10-CM | POA: Diagnosis not present

## 2023-03-13 DIAGNOSIS — D1721 Benign lipomatous neoplasm of skin and subcutaneous tissue of right arm: Secondary | ICD-10-CM | POA: Diagnosis not present

## 2023-03-13 DIAGNOSIS — D692 Other nonthrombocytopenic purpura: Secondary | ICD-10-CM | POA: Diagnosis not present

## 2023-03-13 DIAGNOSIS — L304 Erythema intertrigo: Secondary | ICD-10-CM | POA: Diagnosis not present

## 2023-03-15 ENCOUNTER — Ambulatory Visit: Payer: Medicare Other | Admitting: Physician Assistant

## 2023-03-20 ENCOUNTER — Telehealth: Payer: Self-pay | Admitting: Pharmacist

## 2023-03-20 ENCOUNTER — Ambulatory Visit: Payer: Medicare Other | Admitting: Podiatry

## 2023-03-20 DIAGNOSIS — M6740 Ganglion, unspecified site: Secondary | ICD-10-CM

## 2023-03-20 NOTE — Telephone Encounter (Signed)
Called pt to f/u with [facility]. She is waiting until after her procedure on 8/15 to start.

## 2023-03-20 NOTE — Progress Notes (Signed)
Subjective:  Patient ID: Jacqueline Mora, female    DOB: [birth year],  MRN: [id number]  Chief Complaint  Patient presents with   Foot Pain    67 y.o. female presents with the above complaint.  Patient presents with a new complaint of left fourth digit PIPJ joint mucoid cyst.  Patient states is causing her some discomfort.  She is doing well from the surgical site on the right leg.  She wanted to discuss treatment options for the left side.  She does not want do injection pain scale is 5 out of 10 dull aching nature   Review of Systems: Negative except as noted in the HPI. Denies N/V/F/Ch.  Past Medical History:  Diagnosis Date   Abdominal hernia    Anemia    Anxiety    BMI 45.0-49.9, adult (HCC)    Cancer (HCC)    Colon cancer (HCC)    COVID-19    Depression    GERD (gastroesophageal reflux disease)    History of colon polyps    Hypercholesteremia    Hypertension    occ   Hypothyroidism    IBS (irritable bowel syndrome)    Mitral valve problem    PONV (postoperative nausea and vomiting)    Prediabetes    Squamous cell carcinoma in situ (SCCIS) of skin    Thyroid disease    Vitamin D deficiency     Current Outpatient Medications:    ALPRAZolam (XANAX) 0.5 MG tablet, Take 0.5 mg by mouth as needed for anxiety., Disp: , Rfl:    cholecalciferol (VITAMIN D) 1000 units tablet, Take 1,000 Units by mouth 4 (four) times a week., Disp: , Rfl:    Cyanocobalamin (B-12 PO), Take 1 tablet by mouth 4 (four) times a week., Disp: , Rfl:    estradiol (ESTRACE) 1 MG tablet, Take 1 mg by mouth daily., Disp: , Rfl:    ezetimibe (ZETIA) 10 MG tablet, Take 10 mg by mouth daily., Disp: , Rfl:    FLUoxetine (PROZAC) 10 MG capsule, Take 30 mg by mouth daily., Disp: , Rfl:    fluticasone (FLONASE) 50 MCG/ACT nasal spray, Place 1 spray into both nostrils daily as needed for allergies., Disp: , Rfl: 5   furosemide (LASIX) 40 MG tablet, TAKE 1 TABLET(40 MG TOTAL) BY MOUTH DAILY, Disp: 90 tablet,  Rfl: 2   ketoconazole (NIZORAL) 2 % cream, Apply 1 Application topically daily., Disp: , Rfl:    levothyroxine (SYNTHROID) 150 MCG tablet, Take 150 mcg by mouth daily before breakfast., Disp: , Rfl:    losartan (COZAAR) 100 MG tablet, Take 100 mg by mouth daily., Disp: , Rfl:    RABEprazole (ACIPHEX) 20 MG tablet, Take 20 mg by mouth daily., Disp: , Rfl:    rosuvastatin (CRESTOR) 10 MG tablet, Take 1 tablet (10 mg total) by mouth daily. (Patient not taking: Reported on 03/15/2023), Disp: 90 tablet, Rfl: 3   spironolactone (ALDACTONE) 25 MG tablet, TAKE ONE TABLET BY MOUTH DAILY DOSE INCREASE, Disp: 90 tablet, Rfl: 1   WEGOVY 0.25 MG/0.5ML SOAJ, Inject 0.25 mg into the skin once a week. (Patient not taking: Reported on 03/15/2023), Disp: 2 mL, Rfl: 0  Social History   Tobacco Use  Smoking Status Former   Types: Cigarettes  Smokeless Tobacco Never  Tobacco Comments   Quit 1984    Allergies  Allergen Reactions   Amlodipine Besylate Other (See Comments)    Dizziness   Eszopiclone     Other reaction(s): Crazy dreams  Tape     Adhesive on medical tape and glue burns skin - requires paper tape.   Zocor [Simvastatin]     myalgias   Clindamycin Hcl Rash   Meloxicam Rash   Methylprednisolone Rash   Penicillin G Rash   Objective:  There were no vitals filed for this visit. There is no height or weight on file to calculate BMI. Constitutional Well developed. Well nourished.  Vascular Dorsalis pedis pulses palpable bilaterally. Posterior tibial pulses palpable bilaterally. Capillary refill normal to all digits.  No cyanosis or clubbing noted. Pedal hair growth normal.  Neurologic Normal speech. Oriented to person, place, and time. Epicritic sensation to light touch grossly present bilaterally.  Dermatologic Left fourth digit mucoid cyst hard indurated nodule noted at the PIPJ joint.  Negative transilluminates.  Does not appear to be fluid-filled  Orthopedic: Normal joint ROM without  pain or crepitus bilaterally. No visible deformities. No bony tenderness.   Radiographs: None Assessment:   1. Mucoid cyst of joint    Plan:  Patient was evaluated and treated and all questions answered.  Left fourth digit mucoid cyst -All questions and concerns were discussed with the patient in extensive detail -I discussed with her that we could try a steroid injection however she would like to hold off on it for now. -Also discussed surgical excision of it.  She will think about it and get back to me.  Toe protector was dispensed.  I discussed shoe gear modification.  No follow-ups on file.

## 2023-03-21 ENCOUNTER — Encounter (HOSPITAL_COMMUNITY): Admission: RE | Admit: 2023-03-21 | Payer: Medicare Other | Source: Ambulatory Visit

## 2023-03-26 NOTE — Patient Instructions (Signed)
SURGICAL WAITING ROOM VISITATION  Patients having surgery or a procedure may have no more than 2 support people in the waiting area - these visitors may rotate.    Children under the age of 25 must have an adult with them who is not the patient.  Due to an increase in RSV and influenza rates and associated hospitalizations, children ages 23 and under may not visit patients in [facility] hospitals.  If the patient needs to stay at the hospital during part of their recovery, the visitor guidelines for inpatient rooms apply. Pre-op nurse will coordinate an appropriate time for 1 support person to accompany patient in pre-op.  This support person may not rotate.    Please refer to the [facility] website for the visitor guidelines for Inpatients (after your surgery is over and you are in a regular room).    Your procedure is scheduled on: 04/04/23   Report to [facility] Main Entrance    Report to admitting at 8:45 AM   Call this number if you have problems the morning of surgery [phone]   Do not eat food :After Midnight.   After Midnight you may have the following liquids until 8:00 AM DAY OF SURGERY  Water Non-Citrus Juices (without pulp, NO RED-Apple, White grape, White cranberry) Black Coffee (NO MILK/CREAM OR CREAMERS, sugar ok)  Clear Tea (NO MILK/CREAM OR CREAMERS, sugar ok) regular and decaf                             Plain Jell-O (NO RED)                                           Fruit ices (not with fruit pulp, NO RED)                                     Popsicles (NO RED)                                                               Sports drinks like Gatorade (NO RED)              Drink 2 G2 drinks AT 10:00 PM the night before surgery.        The day of surgery:  Drink ONE (1) Pre-Surgery G2 at 8:00 AM the morning of surgery. Drink in one sitting. Do not sip.  This drink was given to you during your hospital  pre-op appointment visit. Nothing else to  drink after completing the  Pre-Surgery G2.          If you have questions, please contact your surgeon's office.   FOLLOW BOWEL PREP AND ANY ADDITIONAL PRE OP INSTRUCTIONS YOU RECEIVED FROM YOUR SURGEON'S OFFICE!!!     Oral Hygiene is also important to reduce your risk of infection.                                    Remember - BRUSH  YOUR TEETH THE MORNING OF SURGERY WITH YOUR REGULAR TOOTHPASTE  DENTURES WILL BE REMOVED PRIOR TO SURGERY PLEASE DO NOT APPLY "Poly grip" OR ADHESIVES!!!   Stop all vitamins and herbal supplements 7 days before surgery.   Take these medicines the morning of surgery with A SIP OF WATER: Alprazolam, Zetia, Fluoxetine, Flonase, Levothyroxine                               You may not have any metal on your body including hair pins, jewelry, and body piercing             Do not wear make-up, lotions, powders, perfumes, or deodorant  Do not wear nail polish including gel and S&S, artificial/acrylic nails, or any other type of covering on natural nails including finger and toenails. If you have artificial nails, gel coating, etc. that needs to be removed by a nail salon please have this removed prior to surgery or surgery may need to be canceled/ delayed if the surgeon/ anesthesia feels like they are unable to be safely monitored.   Do not shave  48 hours prior to surgery.    Do not bring valuables to the hospital. [facility] IS NOT             RESPONSIBLE   FOR VALUABLES.   Contacts, glasses, dentures or bridgework may not be worn into surgery.   Bring small overnight bag day of surgery.   DO NOT BRING YOUR HOME MEDICATIONS TO THE HOSPITAL. PHARMACY WILL DISPENSE MEDICATIONS LISTED ON YOUR MEDICATION LIST TO YOU DURING YOUR ADMISSION IN THE HOSPITAL!   Special Instructions: Bring a copy of your healthcare power of attorney and living will documents the day of surgery if you haven't scanned them before.              Please read over the following fact  sheets you were given: IF YOU HAVE QUESTIONS ABOUT YOUR PRE-OP INSTRUCTIONS PLEASE CALL [name]    If you received a COVID test during your pre-op visit  it is requested that you wear a mask when out in public, stay away from anyone that may not be feeling well and notify your surgeon if you develop symptoms. If you test positive for Covid or have been in contact with anyone that has tested positive in the last 10 days please notify you surgeon.  [place] - Preparing for Surgery Before surgery, you can play an important role.  Because skin is not sterile, your skin needs to be as free of germs as possible.  You can reduce the number of germs on your skin by washing with CHG (chlorahexidine gluconate) soap before surgery.  CHG is an antiseptic cleaner which kills germs and bonds with the skin to continue killing germs even after washing. Please DO NOT use if you have an allergy to CHG or antibacterial soaps.  If your skin becomes reddened/irritated stop using the CHG and inform your nurse when you arrive at Short Stay. Do not shave (including legs and underarms) for at least 48 hours prior to the first CHG shower.  You may shave your face/neck.  Please follow these instructions carefully:  1.  Shower with CHG Soap the night before surgery and the  morning of surgery.  2.  If you choose to wash your hair, wash your hair first as usual with your normal  shampoo.  3.  After you shampoo,  rinse your hair and body thoroughly to remove the shampoo.                             4.  Use CHG as you would any other liquid soap.  You can apply chg directly to the skin and wash.  Gently with a scrungie or clean washcloth.  5.  Apply the CHG Soap to your body ONLY FROM THE NECK DOWN.   Do   not use on face/ open                           Wound or open sores. Avoid contact with eyes, ears mouth and   genitals (private parts).                       Wash face,  Genitals (private parts) with your normal  soap.             6.  Wash thoroughly, paying special attention to the area where your    surgery  will be performed.  7.  Thoroughly rinse your body with warm water from the neck down.  8.  DO NOT shower/wash with your normal soap after using and rinsing off the CHG Soap.                9.  Pat yourself dry with a clean towel.            10.  Wear clean pajamas.            11.  Place clean sheets on your bed the night of your first shower and do not  sleep with pets. Day of Surgery : Do not apply any lotions/deodorants the morning of surgery.  Please wear clean clothes to the hospital/surgery center.  FAILURE TO FOLLOW THESE INSTRUCTIONS MAY RESULT IN THE CANCELLATION OF YOUR SURGERY  PATIENT SIGNATURE_________________________________  NURSE SIGNATURE__________________________________  ________________________________________________________________________      Jacqueline Mora  An incentive spirometer is a tool that can help keep your lungs clear and active. This tool measures how well you are filling your lungs with each breath. Taking long deep breaths may help reverse or decrease the chance of developing breathing (pulmonary) problems (especially infection) following: A long period of time when you are unable to move or be active. BEFORE THE PROCEDURE  If the spirometer includes an indicator to show your best effort, your nurse or respiratory therapist will set it to a desired goal. If possible, sit up straight or lean slightly forward. Try not to slouch. Hold the incentive spirometer in an upright position. INSTRUCTIONS FOR USE  Sit on the edge of your bed if possible, or sit up as far as you can in bed or on a chair. Hold the incentive spirometer in an upright position. Breathe out normally. Place the mouthpiece in your mouth and seal your lips tightly around it. Breathe in slowly and as deeply as possible, raising the piston or the ball toward the top of the  column. Hold your breath for 3-5 seconds or for as long as possible. Allow the piston or ball to fall to the bottom of the column. Remove the mouthpiece from your mouth and breathe out normally. Rest for a few seconds and repeat Steps 1 through 7 at least 10 times every 1-2 hours when you are awake. Take your time and take a  few normal breaths between deep breaths. The spirometer may include an indicator to show your best effort. Use the indicator as a goal to work toward during each repetition. After each set of 10 deep breaths, practice coughing to be sure your lungs are clear. If you have an incision (the cut made at the time of surgery), support your incision when coughing by placing a pillow or rolled up towels firmly against it. Once you are able to get out of bed, walk around indoors and cough well. You may stop using the incentive spirometer when instructed by your caregiver.  RISKS AND COMPLICATIONS Take your time so you do not get dizzy or light-headed. If you are in pain, you may need to take or ask for pain medication before doing incentive spirometry. It is harder to take a deep breath if you are having pain. AFTER USE Rest and breathe slowly and easily. It can be helpful to keep track of a log of your progress. Your caregiver can provide you with a simple table to help with this. If you are using the spirometer at home, follow these instructions: SEEK MEDICAL CARE IF:  You are having difficultly using the spirometer. You have trouble using the spirometer as often as instructed. Your pain medication is not giving enough relief while using the spirometer. You develop fever of 100.5 F (38.1 C) or higher. SEEK IMMEDIATE MEDICAL CARE IF:  You cough up bloody sputum that had not been present before. You develop fever of 102 F (38.9 C) or greater. You develop worsening pain at or near the incision site. MAKE SURE YOU:  Understand these instructions. Will watch your  condition. Will get help right away if you are not doing well or get worse. Document Released: 12/17/2006 Document Revised: 10/29/2011 Document Reviewed: 02/17/2007 [facility] Patient Information 2014 [place], [place].   ________________________________________________________________________

## 2023-03-26 NOTE — Progress Notes (Signed)
COVID Vaccine Completed:  Date of COVID positive in last 90 days:  PCP - Jarrett Soho, PA Cardiologist - Brigid Re, MD  Cardiac clearance by Robin Searing 02/06/23 in Epic   Chest x-ray -  EKG - 02/06/23 Epic Stress Test - 05/11/21 Epic ECHO - 01/09/21 Epic Cardiac Cath -  Pacemaker/ICD device last checked: Spinal Cord Stimulator:  Bowel Prep -   Sleep Study -  CPAP -   Fasting Blood Sugar - preDM Checks Blood Sugar _____ times a day  Last dose of GLP1 agonist-  Wegovy, hasn't started yet GLP1 instructions:     Last dose of SGLT-2 inhibitors-  N/A SGLT-2 instructions: N/A   Blood Thinner Instructions:  Time Aspirin Instructions: Last Dose:  Activity level:  Can go up a flight of stairs and perform activities of daily living without stopping and without symptoms of chest pain or shortness of breath.  Able to exercise without symptoms  Unable to go up a flight of stairs without symptoms of     Anesthesia review: Non obstructive CAD, HTN  Patient denies shortness of breath, fever, cough and chest pain at PAT appointment  Patient verbalized understanding of instructions that were given to them at the PAT appointment. Patient was also instructed that they will need to review over the PAT instructions again at home before surgery.

## 2023-03-27 ENCOUNTER — Other Ambulatory Visit: Payer: Self-pay

## 2023-03-27 ENCOUNTER — Encounter (HOSPITAL_COMMUNITY): Payer: Self-pay

## 2023-03-27 ENCOUNTER — Ambulatory Visit: Payer: Self-pay | Admitting: Surgery

## 2023-03-27 ENCOUNTER — Encounter (HOSPITAL_COMMUNITY)
Admission: RE | Admit: 2023-03-27 | Discharge: 2023-03-27 | Disposition: A | Discharge status: Home / Self Care | Payer: Medicare Other | Source: Ambulatory Visit | Attending: Surgery | Admitting: Surgery

## 2023-03-27 VITALS — BP 144/85 | HR 80 | Temp 98.3°F | Resp 16 | Ht 66.0 in | Wt 280.0 lb

## 2023-03-27 DIAGNOSIS — E669 Obesity, unspecified: Secondary | ICD-10-CM | POA: Insufficient documentation

## 2023-03-27 DIAGNOSIS — Z01812 Encounter for preprocedural laboratory examination: Secondary | ICD-10-CM | POA: Diagnosis not present

## 2023-03-27 DIAGNOSIS — Z6841 Body Mass Index (BMI) 40.0 and over, adult: Secondary | ICD-10-CM | POA: Insufficient documentation

## 2023-03-27 DIAGNOSIS — Z01818 Encounter for other preprocedural examination: Secondary | ICD-10-CM | POA: Diagnosis not present

## 2023-03-27 DIAGNOSIS — D692 Other nonthrombocytopenic purpura: Secondary | ICD-10-CM

## 2023-03-27 DIAGNOSIS — K432 Incisional hernia without obstruction or gangrene: Secondary | ICD-10-CM | POA: Insufficient documentation

## 2023-03-27 DIAGNOSIS — I2510 Atherosclerotic heart disease of native coronary artery without angina pectoris: Secondary | ICD-10-CM | POA: Insufficient documentation

## 2023-03-27 DIAGNOSIS — I10 Essential (primary) hypertension: Secondary | ICD-10-CM | POA: Diagnosis not present

## 2023-03-27 DIAGNOSIS — Z87891 Personal history of nicotine dependence: Secondary | ICD-10-CM | POA: Insufficient documentation

## 2023-03-27 HISTORY — DX: Rheumatic mitral valve disease, unspecified: I05.9

## 2023-03-27 HISTORY — DX: Other specified postprocedural states: Z98.890

## 2023-03-27 HISTORY — DX: Anemia, unspecified: D64.9

## 2023-03-27 LAB — BASIC METABOLIC PANEL
Anion gap: 6 (ref 5–15)
BUN: 18 mg/dL (ref 8–23)
CO2: 27 mmol/L (ref 22–32)
Calcium: 9 mg/dL (ref 8.9–10.3)
Chloride: 103 mmol/L (ref 98–111)
Creatinine, Ser: 1.04 mg/dL — ABNORMAL HIGH (ref 0.44–1.00)
GFR, Estimated: 59 mL/min — ABNORMAL LOW (ref >60)
Glucose, Bld: 114 mg/dL — ABNORMAL HIGH (ref 70–99)
Potassium: 4.5 mmol/L (ref 3.5–5.1)
Sodium: 136 mmol/L (ref 135–145)

## 2023-03-27 LAB — CBC
HCT: 43.2 % (ref 36.0–46.0)
Hemoglobin: 14 g/dL (ref 12.0–15.0)
MCH: 28.6 pg (ref 26.0–34.0)
MCHC: 32.4 g/dL (ref 30.0–36.0)
MCV: 88.2 fL (ref 80.0–100.0)
Platelets: 292 10*3/uL (ref 150–400)
RBC: 4.9 MIL/uL (ref 3.87–5.11)
RDW: 12.9 % (ref 11.5–15.5)
WBC: 7.8 10*3/uL (ref 4.0–10.5)
nRBC: 0 % (ref 0.0–0.2)

## 2023-03-27 LAB — APTT: aPTT: 27 seconds (ref 24–36)

## 2023-03-27 LAB — PROTIME-INR
INR: 1 (ref 0.8–1.2)
Prothrombin Time: 13.6 seconds (ref 11.4–15.2)

## 2023-03-28 ENCOUNTER — Encounter: Payer: Self-pay | Admitting: Internal Medicine

## 2023-03-28 NOTE — Progress Notes (Signed)
Case: [id number] Date/Time: 04/04/23 1046   Procedure: LAPAROSCOPIC VENTRAL WALL HERNIA WITH LYSIS OF ADHESIONS   Anesthesia type: General   Pre-op diagnosis: VENTRAL INCISIONAL REOCRRUING AND INCARCERATED HERNIA   Location: [facility] ROOM 02 / [facility]   Surgeons: Karie Soda, MD       DISCUSSION: Jacqueline Mora is a 67 yo female who presents to PAT prior to surgery above. PMH significant for former smoking, obesity, non-obstructive CAD, HTN, GERD, hypothyroidism, anxiety, depression, multiple abdominal surgeries c/b recurrent hernias. Now scheduled for another repair of incisional hernia.  Prior anesthesia complications include PONV  Patient was referred to Cardiology for risk assessment and clearance due to DOE. Has had remote cardiac w/u for CP in 2022 including echo and stress test which were reassuring. Also had CP/SOB in April 2024 and a CT calcium score was ordered. Results showed mild non-obstructive CAD. Risk assessment and clearance provided on 02/06/23:  "Preoperative clearance: -Patient is RCRI score is 0.9%   -The patient affirms she has been doing well without any new cardiac symptoms. They are able to achieve 5 METS without cardiac limitations. Therefore, based on ACC/AHA guidelines, the patient would be at acceptable risk for the planned procedure without further cardiovascular testing. The patient was advised that if she develops new symptoms prior to surgery to contact our office to arrange for a follow-up visit, and she verbalized understanding. "   VS: BP (!) 144/85   Pulse 80   Temp 36.8 C (Oral)   Resp 16   Ht 5\' 6"  (1.676 m)   Wt 127 kg   SpO2 100%   BMI 45.19 kg/m   PROVIDERS: Jarrett Soho, PA-C Christell Constant, MD   LABS: Labs reviewed: Acceptable for surgery. (all labs ordered are listed, but only abnormal results are displayed)  Labs Reviewed  BASIC METABOLIC PANEL - Abnormal; Notable for the following components:      Result Value   Glucose,  Bld 114 (*)    Creatinine, Ser 1.04 (*)    GFR, Estimated 59 (*)    All other components within normal limits  CBC  PROTIME-INR  APTT     IMAGES:  CT A/P 01/04/23:  IMPRESSION: Moderate to large paraumbilical hernia containing a loop of transverse colon. No evidence of bowel obstruction or strangulation.   Tiny epigastric ventral hernia, which contains only fat.   EKG 02/06/23  NSR, rate 66   CV:  CT Calcium score 12/10/22:  IMPRESSION: 1. Coronary calcium score of 54. This was 70th percentile for age-, sex, and race-matched controls.   2. Total plaque volume 83 mm3 which is 32nd percentile for age- and sex-matched controls (calcified plaque 7 mm3; non-calcified plaque 76 mm3). TPV is mild.   2. Normal coronary origin with left dominance.   3. There is minimal (<25%) plaque in the LAD and LCX.  CAD-RADS 1.  NM Stress test 05/11/2021:    The study is normal. The study is low risk.   LV perfusion is normal.   Left ventricular function is normal. Nuclear stress EF: 58 %. The left ventricular ejection fraction is normal (55-65%). End diastolic cavity size is normal.   Prior study not available for comparison.   Low risk stress nuclear study with normal perfusion and normal left ventricular regional and global systolic function.  Echo 01/09/2021:  IMPRESSIONS     1. Left ventricular ejection fraction, by estimation, is 60 to 65%. The  left ventricle has normal function. The left ventricle has no  regional  wall motion abnormalities. There is mild left ventricular hypertrophy.  Left ventricular diastolic parameters  are consistent with Grade II diastolic dysfunction (pseudonormalization).  Elevated left ventricular end-diastolic pressure.   2. Right ventricular systolic function is normal. The right ventricular  size is normal.   3. Left atrial size was mildly dilated.   4. The mitral valve is abnormal. Trivial mitral valve regurgitation. No  evidence of mitral  stenosis.   5. The aortic valve was not well visualized. There is mild calcification  of the aortic valve. Aortic valve regurgitation is not visualized. Mild  aortic valve sclerosis is present, with no evidence of aortic valve  stenosis.   6. The inferior vena cava is normal in size with greater than 50%  respiratory variability, suggesting right atrial pressure of 3 mmHg.   Past Medical History:  Diagnosis Date   Abdominal hernia    Anemia    Anxiety    BMI 45.0-49.9, adult (HCC)    Cancer (HCC)    Colon cancer (HCC)    COVID-19    Depression    GERD (gastroesophageal reflux disease)    History of colon polyps    Hypercholesteremia    Hypertension    occ   Hypothyroidism    IBS (irritable bowel syndrome)    Mitral valve problem    PONV (postoperative nausea and vomiting)    Prediabetes    Squamous cell carcinoma in situ (SCCIS) of skin    Thyroid disease    Vitamin D deficiency     Past Surgical History:  Procedure Laterality Date   ABDOMINAL HYSTERECTOMY     BLADDER NECK SUSPENSION     CESAREAN SECTION     x 2   colon cancer     COLONOSCOPY     HERNIA REPAIR     Mesh placed then later removed due to infection.   laparostomy      MEDICATIONS:  ALPRAZolam (XANAX) 0.5 MG tablet   cholecalciferol (VITAMIN D) 1000 units tablet   Cyanocobalamin (B-12 PO)   estradiol (ESTRACE) 1 MG tablet   ezetimibe (ZETIA) 10 MG tablet   FLUoxetine (PROZAC) 10 MG capsule   fluticasone (FLONASE) 50 MCG/ACT nasal spray   furosemide (LASIX) 40 MG tablet   ketoconazole (NIZORAL) 2 % cream   levothyroxine (SYNTHROID) 150 MCG tablet   losartan (COZAAR) 100 MG tablet   RABEprazole (ACIPHEX) 20 MG tablet   rosuvastatin (CRESTOR) 10 MG tablet   spironolactone (ALDACTONE) 25 MG tablet   WEGOVY 0.25 MG/0.5ML SOAJ   No current facility-administered medications for this encounter.   Marcille Blanco [facility] Short Stay/Anesthesiology [facility] Phone [phone] 03/28/2023 6:52  PM

## 2023-03-28 NOTE — Anesthesia Preprocedure Evaluation (Addendum)
Anesthesia Evaluation  Patient identified by MRN, date of birth, ID band Patient awake    Reviewed: Allergy & Precautions, NPO status , Patient's Chart, lab work & pertinent test results, reviewed documented beta blocker date and time   History of Anesthesia Complications (+) PONV and history of anesthetic complications  Airway Mallampati: II  TM Distance: >3 FB     Dental no notable dental hx. (+) Teeth Intact, Dental Advisory Given, Caps   Pulmonary former smoker   Pulmonary exam normal breath sounds clear to auscultation       Cardiovascular hypertension, Pt. on medications + CAD and + DOE  Normal cardiovascular exam Rhythm:Regular Rate:Normal     Neuro/Psych  Headaches PSYCHIATRIC DISORDERS Anxiety Depression       GI/Hepatic Neg liver ROS,GERD  ,,  Endo/Other  Hypothyroidism  Morbid obesityHyperlipidemia  Renal/GU negative Renal ROS  negative genitourinary   Musculoskeletal negative musculoskeletal ROS (+)    Abdominal  (+) + obese  Peds  Hematology  (+) Blood dyscrasia, anemia   Anesthesia Other Findings   Reproductive/Obstetrics                             Anesthesia Physical Anesthesia Plan  ASA: 3  Anesthesia Plan: General   Post-op Pain Management: Minimal or no pain anticipated   Induction:   PONV Risk Score and Plan: 4 or greater and Droperidol, Ondansetron, Dexamethasone, Treatment may vary due to age or medical condition, Midazolam and TIVA  Airway Management Planned: Oral ETT  Additional Equipment: None  Intra-op Plan:   Post-operative Plan: Extubation in OR  Informed Consent: I have reviewed the patients History and Physical, chart, labs and discussed the procedure including the risks, benefits and alternatives for the proposed anesthesia with the patient or authorized representative who has indicated his/her understanding and acceptance.     Dental  advisory given  Plan Discussed with: CRNA and Anesthesiologist  Anesthesia Plan Comments: (See PAT note from 8/7 by Sherlie Ban PA-C )        Anesthesia Quick Evaluation

## 2023-04-04 ENCOUNTER — Inpatient Hospital Stay (HOSPITAL_COMMUNITY): Payer: Medicare Other | Admitting: Medical

## 2023-04-04 ENCOUNTER — Other Ambulatory Visit: Payer: Self-pay

## 2023-04-04 ENCOUNTER — Inpatient Hospital Stay (HOSPITAL_COMMUNITY)
Admission: RE | Admit: 2023-04-04 | Discharge: 2023-04-09 | DRG: 354 | Disposition: A | Discharge status: Home / Self Care | Payer: Medicare Other | Attending: Surgery | Admitting: Surgery

## 2023-04-04 ENCOUNTER — Encounter (HOSPITAL_COMMUNITY)
Admission: RE | Disposition: A | Discharge status: Home / Self Care | Payer: Self-pay | Source: Home / Self Care | Attending: Surgery

## 2023-04-04 ENCOUNTER — Inpatient Hospital Stay (HOSPITAL_COMMUNITY): Payer: Medicare Other | Admitting: Anesthesiology

## 2023-04-04 ENCOUNTER — Encounter (HOSPITAL_COMMUNITY): Payer: Self-pay | Admitting: Surgery

## 2023-04-04 DIAGNOSIS — Z85048 Personal history of other malignant neoplasm of rectum, rectosigmoid junction, and anus: Secondary | ICD-10-CM | POA: Diagnosis not present

## 2023-04-04 DIAGNOSIS — F5102 Adjustment insomnia: Secondary | ICD-10-CM | POA: Diagnosis present

## 2023-04-04 DIAGNOSIS — E65 Localized adiposity: Secondary | ICD-10-CM | POA: Diagnosis not present

## 2023-04-04 DIAGNOSIS — E039 Hypothyroidism, unspecified: Secondary | ICD-10-CM | POA: Diagnosis not present

## 2023-04-04 DIAGNOSIS — K219 Gastro-esophageal reflux disease without esophagitis: Secondary | ICD-10-CM | POA: Diagnosis not present

## 2023-04-04 DIAGNOSIS — K5900 Constipation, unspecified: Secondary | ICD-10-CM | POA: Diagnosis present

## 2023-04-04 DIAGNOSIS — M793 Panniculitis, unspecified: Secondary | ICD-10-CM | POA: Diagnosis not present

## 2023-04-04 DIAGNOSIS — Z91048 Other nonmedicinal substance allergy status: Secondary | ICD-10-CM | POA: Diagnosis not present

## 2023-04-04 DIAGNOSIS — Z6841 Body Mass Index (BMI) 40.0 and over, adult: Secondary | ICD-10-CM

## 2023-04-04 DIAGNOSIS — K660 Peritoneal adhesions (postprocedural) (postinfection): Secondary | ICD-10-CM | POA: Diagnosis not present

## 2023-04-04 DIAGNOSIS — E860 Dehydration: Secondary | ICD-10-CM | POA: Diagnosis present

## 2023-04-04 DIAGNOSIS — Z87891 Personal history of nicotine dependence: Secondary | ICD-10-CM | POA: Diagnosis not present

## 2023-04-04 DIAGNOSIS — Z833 Family history of diabetes mellitus: Secondary | ICD-10-CM

## 2023-04-04 DIAGNOSIS — I2510 Atherosclerotic heart disease of native coronary artery without angina pectoris: Secondary | ICD-10-CM | POA: Diagnosis not present

## 2023-04-04 DIAGNOSIS — Z888 Allergy status to other drugs, medicaments and biological substances status: Secondary | ICD-10-CM | POA: Diagnosis not present

## 2023-04-04 DIAGNOSIS — F419 Anxiety disorder, unspecified: Secondary | ICD-10-CM | POA: Diagnosis present

## 2023-04-04 DIAGNOSIS — Z79899 Other long term (current) drug therapy: Secondary | ICD-10-CM

## 2023-04-04 DIAGNOSIS — R0609 Other forms of dyspnea: Secondary | ICD-10-CM | POA: Diagnosis present

## 2023-04-04 DIAGNOSIS — Z79890 Hormone replacement therapy (postmenopausal): Secondary | ICD-10-CM

## 2023-04-04 DIAGNOSIS — Z85820 Personal history of malignant melanoma of skin: Secondary | ICD-10-CM

## 2023-04-04 DIAGNOSIS — E7800 Pure hypercholesterolemia, unspecified: Secondary | ICD-10-CM | POA: Diagnosis not present

## 2023-04-04 DIAGNOSIS — K430 Incisional hernia with obstruction, without gangrene: Principal | ICD-10-CM | POA: Diagnosis present

## 2023-04-04 DIAGNOSIS — R233 Spontaneous ecchymoses: Secondary | ICD-10-CM

## 2023-04-04 DIAGNOSIS — I10 Essential (primary) hypertension: Secondary | ICD-10-CM | POA: Diagnosis present

## 2023-04-04 DIAGNOSIS — D62 Acute posthemorrhagic anemia: Secondary | ICD-10-CM | POA: Diagnosis not present

## 2023-04-04 DIAGNOSIS — K580 Irritable bowel syndrome with diarrhea: Secondary | ICD-10-CM | POA: Diagnosis not present

## 2023-04-04 DIAGNOSIS — K432 Incisional hernia without obstruction or gangrene: Principal | ICD-10-CM | POA: Diagnosis present

## 2023-04-04 DIAGNOSIS — E785 Hyperlipidemia, unspecified: Secondary | ICD-10-CM | POA: Diagnosis present

## 2023-04-04 DIAGNOSIS — Z8719 Personal history of other diseases of the digestive system: Secondary | ICD-10-CM | POA: Diagnosis not present

## 2023-04-04 DIAGNOSIS — R7303 Prediabetes: Secondary | ICD-10-CM | POA: Diagnosis not present

## 2023-04-04 DIAGNOSIS — R0602 Shortness of breath: Secondary | ICD-10-CM | POA: Diagnosis not present

## 2023-04-04 DIAGNOSIS — Z90710 Acquired absence of both cervix and uterus: Secondary | ICD-10-CM | POA: Diagnosis not present

## 2023-04-04 DIAGNOSIS — K436 Other and unspecified ventral hernia with obstruction, without gangrene: Secondary | ICD-10-CM | POA: Diagnosis not present

## 2023-04-04 DIAGNOSIS — E8770 Fluid overload, unspecified: Secondary | ICD-10-CM | POA: Diagnosis not present

## 2023-04-04 DIAGNOSIS — Z880 Allergy status to penicillin: Secondary | ICD-10-CM

## 2023-04-04 DIAGNOSIS — K565 Intestinal adhesions [bands], unspecified as to partial versus complete obstruction: Secondary | ICD-10-CM | POA: Diagnosis not present

## 2023-04-04 DIAGNOSIS — K589 Irritable bowel syndrome without diarrhea: Secondary | ICD-10-CM

## 2023-04-04 DIAGNOSIS — N189 Chronic kidney disease, unspecified: Secondary | ICD-10-CM | POA: Diagnosis not present

## 2023-04-04 DIAGNOSIS — R918 Other nonspecific abnormal finding of lung field: Secondary | ICD-10-CM | POA: Diagnosis not present

## 2023-04-04 DIAGNOSIS — R0989 Other specified symptoms and signs involving the circulatory and respiratory systems: Secondary | ICD-10-CM | POA: Diagnosis not present

## 2023-04-04 DIAGNOSIS — Z881 Allergy status to other antibiotic agents status: Secondary | ICD-10-CM

## 2023-04-04 HISTORY — PX: VENTRAL HERNIA REPAIR: SHX424

## 2023-04-04 HISTORY — DX: Spontaneous ecchymoses: R23.3

## 2023-04-04 SURGERY — REPAIR, HERNIA, VENTRAL, LAPAROSCOPIC
Anesthesia: General

## 2023-04-04 MED ORDER — PROCHLORPERAZINE EDISYLATE 10 MG/2ML IJ SOLN
5.0000 mg | Freq: Four times a day (QID) | INTRAMUSCULAR | Status: DC | PRN
  Administered 2023-04-05: 10 mg via INTRAVENOUS
  Filled 2023-04-04: qty 2

## 2023-04-04 MED ORDER — CEFAZOLIN SODIUM-DEXTROSE 2-4 GM/100ML-% IV SOLN
2.0000 g | Freq: Three times a day (TID) | INTRAVENOUS | Status: AC
  Administered 2023-04-04 – 2023-04-05 (×2): 2 g via INTRAVENOUS
  Filled 2023-04-04 (×2): qty 100

## 2023-04-04 MED ORDER — SIMETHICONE 80 MG PO CHEW
40.0000 mg | CHEWABLE_TABLET | Freq: Four times a day (QID) | ORAL | Status: DC | PRN
  Filled 2023-04-04: qty 1

## 2023-04-04 MED ORDER — ESTRADIOL 0.5 MG PO TABS
1.0000 mg | ORAL_TABLET | Freq: Every day | ORAL | Status: DC
  Administered 2023-04-05 – 2023-04-09 (×5): 1 mg via ORAL
  Filled 2023-04-04 (×5): qty 2

## 2023-04-04 MED ORDER — FENTANYL CITRATE (PF) 100 MCG/2ML IJ SOLN
INTRAMUSCULAR | Status: AC
  Filled 2023-04-04: qty 2

## 2023-04-04 MED ORDER — MIDAZOLAM HCL 5 MG/5ML IJ SOLN
INTRAMUSCULAR | Status: DC | PRN
  Administered 2023-04-04: 2 mg via INTRAVENOUS

## 2023-04-04 MED ORDER — ALUM & MAG HYDROXIDE-SIMETH 200-200-20 MG/5ML PO SUSP
30.0000 mL | Freq: Four times a day (QID) | ORAL | Status: DC | PRN
  Administered 2023-04-05 – 2023-04-09 (×2): 30 mL via ORAL
  Filled 2023-04-04 (×2): qty 30

## 2023-04-04 MED ORDER — PROPOFOL 10 MG/ML IV BOLUS
INTRAVENOUS | Status: DC | PRN
Start: 2023-04-04 — End: 2023-04-04
  Administered 2023-04-04: 55 ug/kg/min via INTRAVENOUS
  Administered 2023-04-04: 150 mg via INTRAVENOUS

## 2023-04-04 MED ORDER — SODIUM CHLORIDE 0.9% FLUSH
3.0000 mL | Freq: Two times a day (BID) | INTRAVENOUS | Status: DC
  Administered 2023-04-05 – 2023-04-06 (×3): 3 mL via INTRAVENOUS

## 2023-04-04 MED ORDER — EPHEDRINE 5 MG/ML INJ
INTRAVENOUS | Status: AC
  Filled 2023-04-04: qty 5

## 2023-04-04 MED ORDER — ENOXAPARIN SODIUM 40 MG/0.4ML IJ SOSY
40.0000 mg | PREFILLED_SYRINGE | INTRAMUSCULAR | Status: DC
  Administered 2023-04-06 – 2023-04-08 (×3): 40 mg via SUBCUTANEOUS
  Filled 2023-04-04 (×3): qty 0.4

## 2023-04-04 MED ORDER — ENSURE PRE-SURGERY PO LIQD
592.0000 mL | Freq: Once | ORAL | Status: DC
  Filled 2023-04-04: qty 592

## 2023-04-04 MED ORDER — BISACODYL 10 MG RE SUPP: 10.0000 mg | Freq: Every day | RECTAL | Status: DC | PRN

## 2023-04-04 MED ORDER — ORAL CARE MOUTH RINSE: 15.0000 mL | Freq: Once | OROMUCOSAL | Status: AC

## 2023-04-04 MED ORDER — NAPHAZOLINE-GLYCERIN 0.012-0.25 % OP SOLN: 1.0000 [drp] | Freq: Four times a day (QID) | OPHTHALMIC | Status: DC | PRN

## 2023-04-04 MED ORDER — BUPIVACAINE-EPINEPHRINE (PF) 0.25% -1:200000 IJ SOLN
INTRAMUSCULAR | Status: DC | PRN
  Administered 2023-04-04: 70 mL

## 2023-04-04 MED ORDER — ACETAMINOPHEN 500 MG PO TABS
1000.0000 mg | ORAL_TABLET | ORAL | Status: AC
  Administered 2023-04-04: 1000 mg via ORAL
  Filled 2023-04-04: qty 2

## 2023-04-04 MED ORDER — BUPIVACAINE LIPOSOME 1.3 % IJ SUSP: 20.0000 mL | Freq: Once | INTRAMUSCULAR | Status: DC

## 2023-04-04 MED ORDER — SODIUM CHLORIDE 0.9 % IV SOLN: Freq: Three times a day (TID) | INTRAVENOUS | Status: DC | PRN

## 2023-04-04 MED ORDER — HYDROMORPHONE HCL 1 MG/ML IJ SOLN
0.2500 mg | INTRAMUSCULAR | Status: DC | PRN
  Administered 2023-04-04 (×2): 0.5 mg via INTRAVENOUS
  Administered 2023-04-04 (×2): 0.25 mg via INTRAVENOUS

## 2023-04-04 MED ORDER — SALINE SPRAY 0.65 % NA SOLN
1.0000 | Freq: Four times a day (QID) | NASAL | Status: DC | PRN
  Filled 2023-04-04: qty 44

## 2023-04-04 MED ORDER — CHLORHEXIDINE GLUCONATE CLOTH 2 % EX PADS: 6.0000 | MEDICATED_PAD | Freq: Once | CUTANEOUS | Status: DC

## 2023-04-04 MED ORDER — MENTHOL 3 MG MT LOZG: 1.0000 | LOZENGE | OROMUCOSAL | Status: DC | PRN

## 2023-04-04 MED ORDER — HYDROMORPHONE HCL 1 MG/ML IJ SOLN
INTRAMUSCULAR | Status: AC
  Filled 2023-04-04: qty 1

## 2023-04-04 MED ORDER — EZETIMIBE 10 MG PO TABS
10.0000 mg | ORAL_TABLET | Freq: Every day | ORAL | Status: DC
  Administered 2023-04-05 – 2023-04-09 (×5): 10 mg via ORAL
  Filled 2023-04-04 (×5): qty 1

## 2023-04-04 MED ORDER — OXYCODONE HCL 5 MG PO TABS: 5.0000 mg | ORAL_TABLET | Freq: Once | ORAL | Status: DC | PRN

## 2023-04-04 MED ORDER — SUGAMMADEX SODIUM 200 MG/2ML IV SOLN
INTRAVENOUS | Status: DC | PRN
  Administered 2023-04-04: 300 mg via INTRAVENOUS

## 2023-04-04 MED ORDER — OXYCODONE HCL 5 MG PO TABS
5.0000 mg | ORAL_TABLET | ORAL | Status: DC | PRN
  Administered 2023-04-05 – 2023-04-06 (×3): 10 mg via ORAL
  Filled 2023-04-04 (×3): qty 2
  Filled 2023-04-04 (×2): qty 1

## 2023-04-04 MED ORDER — PHENYLEPHRINE 80 MCG/ML (10ML) SYRINGE FOR IV PUSH (FOR BLOOD PRESSURE SUPPORT)
PREFILLED_SYRINGE | INTRAVENOUS | Status: DC | PRN
  Administered 2023-04-04 (×2): 120 ug via INTRAVENOUS

## 2023-04-04 MED ORDER — 0.9 % SODIUM CHLORIDE (POUR BTL) OPTIME
TOPICAL | Status: DC | PRN
  Administered 2023-04-04: 1000 mL

## 2023-04-04 MED ORDER — PROPOFOL 500 MG/50ML IV EMUL
INTRAVENOUS | Status: AC
  Filled 2023-04-04: qty 50

## 2023-04-04 MED ORDER — MAGIC MOUTHWASH: 15.0000 mL | Freq: Four times a day (QID) | ORAL | Status: DC | PRN

## 2023-04-04 MED ORDER — FENTANYL CITRATE (PF) 100 MCG/2ML IJ SOLN
INTRAMUSCULAR | Status: DC | PRN
  Administered 2023-04-04: 50 ug via INTRAVENOUS
  Administered 2023-04-04: 100 ug via INTRAVENOUS

## 2023-04-04 MED ORDER — METHOCARBAMOL 1000 MG/10ML IJ SOLN: 1000.0000 mg | Freq: Four times a day (QID) | INTRAVENOUS | Status: DC | PRN

## 2023-04-04 MED ORDER — DEXAMETHASONE SODIUM PHOSPHATE 10 MG/ML IJ SOLN
INTRAMUSCULAR | Status: DC | PRN
  Administered 2023-04-04: 10 mg via INTRAVENOUS

## 2023-04-04 MED ORDER — PHENOL 1.4 % MT LIQD: 2.0000 | OROMUCOSAL | Status: DC | PRN

## 2023-04-04 MED ORDER — TRAMADOL HCL 50 MG PO TABS
50.0000 mg | ORAL_TABLET | Freq: Four times a day (QID) | ORAL | Status: DC | PRN
  Administered 2023-04-07 – 2023-04-08 (×2): 100 mg via ORAL
  Filled 2023-04-04 (×2): qty 2

## 2023-04-04 MED ORDER — DIPHENHYDRAMINE HCL 12.5 MG/5ML PO ELIX: 12.5000 mg | ORAL_SOLUTION | Freq: Four times a day (QID) | ORAL | Status: DC | PRN

## 2023-04-04 MED ORDER — POTASSIUM CHLORIDE 2 MEQ/ML IV SOLN
INTRAVENOUS | Status: AC
  Filled 2023-04-04 (×2): qty 1000

## 2023-04-04 MED ORDER — PANTOPRAZOLE SODIUM 40 MG PO TBEC
40.0000 mg | DELAYED_RELEASE_TABLET | Freq: Every day | ORAL | Status: DC
  Administered 2023-04-05 – 2023-04-09 (×5): 40 mg via ORAL
  Filled 2023-04-04 (×5): qty 1

## 2023-04-04 MED ORDER — FLUOXETINE HCL 20 MG PO CAPS
30.0000 mg | ORAL_CAPSULE | Freq: Every day | ORAL | Status: DC
  Administered 2023-04-05 – 2023-04-09 (×5): 30 mg via ORAL
  Filled 2023-04-04 (×5): qty 1

## 2023-04-04 MED ORDER — ONDANSETRON 4 MG PO TBDP: 4.0000 mg | ORAL_TABLET | Freq: Four times a day (QID) | ORAL | Status: DC | PRN

## 2023-04-04 MED ORDER — ONDANSETRON HCL 4 MG/2ML IJ SOLN
INTRAMUSCULAR | Status: DC | PRN
  Administered 2023-04-04 (×2): 4 mg via INTRAVENOUS

## 2023-04-04 MED ORDER — PHENYLEPHRINE HCL-NACL 20-0.9 MG/250ML-% IV SOLN
INTRAVENOUS | Status: DC | PRN
  Administered 2023-04-04: 45 ug/min via INTRAVENOUS

## 2023-04-04 MED ORDER — MAGNESIUM HYDROXIDE 400 MG/5ML PO SUSP: 30.0000 mL | Freq: Every day | ORAL | Status: DC | PRN

## 2023-04-04 MED ORDER — VANCOMYCIN HCL 1500 MG/300ML IV SOLN
1500.0000 mg | INTRAVENOUS | Status: AC
  Administered 2023-04-04: 1500 mg via INTRAVENOUS
  Filled 2023-04-04: qty 300

## 2023-04-04 MED ORDER — FUROSEMIDE 40 MG PO TABS: 40.0000 mg | ORAL_TABLET | Freq: Every day | ORAL | Status: DC

## 2023-04-04 MED ORDER — GABAPENTIN 100 MG PO CAPS
300.0000 mg | ORAL_CAPSULE | Freq: Two times a day (BID) | ORAL | Status: DC
  Administered 2023-04-04: 300 mg via ORAL
  Filled 2023-04-04: qty 3

## 2023-04-04 MED ORDER — GENTAMICIN SULFATE 40 MG/ML IJ SOLN
5.0000 mg/kg | INTRAVENOUS | Status: AC
  Administered 2023-04-04: 430 mg via INTRAVENOUS
  Filled 2023-04-04: qty 10.75

## 2023-04-04 MED ORDER — PHENTERMINE HCL 15 MG PO CAPS: 15.0000 mg | ORAL_CAPSULE | ORAL | Status: DC

## 2023-04-04 MED ORDER — GABAPENTIN 300 MG PO CAPS
300.0000 mg | ORAL_CAPSULE | ORAL | Status: AC
  Administered 2023-04-04: 300 mg via ORAL
  Filled 2023-04-04: qty 1

## 2023-04-04 MED ORDER — DIPHENHYDRAMINE HCL 50 MG/ML IJ SOLN: 12.5000 mg | Freq: Four times a day (QID) | INTRAMUSCULAR | Status: DC | PRN

## 2023-04-04 MED ORDER — EPHEDRINE SULFATE-NACL 50-0.9 MG/10ML-% IV SOSY
PREFILLED_SYRINGE | INTRAVENOUS | Status: DC | PRN
  Administered 2023-04-04: 10 mg via INTRAVENOUS
  Administered 2023-04-04 (×3): 5 mg via INTRAVENOUS
  Administered 2023-04-04 (×2): 10 mg via INTRAVENOUS
  Administered 2023-04-04: 5 mg via INTRAVENOUS
  Administered 2023-04-04: 10 mg via INTRAVENOUS
  Administered 2023-04-04: 5 mg via INTRAVENOUS

## 2023-04-04 MED ORDER — SODIUM CHLORIDE 0.9 % IV SOLN: 250.0000 mL | INTRAVENOUS | Status: DC | PRN

## 2023-04-04 MED ORDER — KETOCONAZOLE 2 % EX CREA
1.0000 | TOPICAL_CREAM | Freq: Every day | CUTANEOUS | Status: DC
  Administered 2023-04-05 – 2023-04-06 (×2): 1 via TOPICAL
  Filled 2023-04-04: qty 15

## 2023-04-04 MED ORDER — ALBUMIN HUMAN 5 % IV SOLN: INTRAVENOUS | Status: DC | PRN

## 2023-04-04 MED ORDER — DEXMEDETOMIDINE HCL IN NACL 80 MCG/20ML IV SOLN
INTRAVENOUS | Status: DC | PRN
  Administered 2023-04-04: 8 ug via INTRAVENOUS

## 2023-04-04 MED ORDER — LACTATED RINGERS IR SOLN
Status: DC | PRN
  Administered 2023-04-04 (×2): 1000 mL

## 2023-04-04 MED ORDER — CALCIUM POLYCARBOPHIL 625 MG PO TABS
625.0000 mg | ORAL_TABLET | Freq: Two times a day (BID) | ORAL | Status: DC
  Administered 2023-04-04 – 2023-04-09 (×10): 625 mg via ORAL
  Filled 2023-04-04 (×10): qty 1

## 2023-04-04 MED ORDER — LIDOCAINE 2% (20 MG/ML) 5 ML SYRINGE
INTRAMUSCULAR | Status: DC | PRN
  Administered 2023-04-04: 80 mg via INTRAVENOUS

## 2023-04-04 MED ORDER — ALPRAZOLAM 0.5 MG PO TABS
0.5000 mg | ORAL_TABLET | Freq: Every day | ORAL | Status: DC | PRN
  Administered 2023-04-07: 0.5 mg via ORAL
  Filled 2023-04-04: qty 1

## 2023-04-04 MED ORDER — OXYCODONE HCL 5 MG/5ML PO SOLN: 5.0000 mg | Freq: Once | ORAL | Status: DC | PRN

## 2023-04-04 MED ORDER — SODIUM CHLORIDE 0.9% FLUSH: 3.0000 mL | INTRAVENOUS | Status: DC | PRN

## 2023-04-04 MED ORDER — CHLORHEXIDINE GLUCONATE 0.12 % MT SOLN
15.0000 mL | Freq: Once | OROMUCOSAL | Status: AC
  Administered 2023-04-04: 15 mL via OROMUCOSAL

## 2023-04-04 MED ORDER — METHOCARBAMOL 500 MG PO TABS
1000.0000 mg | ORAL_TABLET | Freq: Four times a day (QID) | ORAL | Status: DC | PRN
  Administered 2023-04-08: 1000 mg via ORAL
  Filled 2023-04-04: qty 2

## 2023-04-04 MED ORDER — ENALAPRILAT 1.25 MG/ML IV SOLN: 0.6250 mg | Freq: Four times a day (QID) | INTRAVENOUS | Status: DC | PRN

## 2023-04-04 MED ORDER — ONDANSETRON HCL 4 MG/2ML IJ SOLN
INTRAMUSCULAR | Status: AC
  Filled 2023-04-04: qty 2

## 2023-04-04 MED ORDER — LACTATED RINGERS IV SOLN: INTRAVENOUS | Status: DC

## 2023-04-04 MED ORDER — ENSURE PRE-SURGERY PO LIQD: 296.0000 mL | Freq: Once | ORAL | Status: DC

## 2023-04-04 MED ORDER — HYDROMORPHONE HCL 1 MG/ML IJ SOLN
0.5000 mg | INTRAMUSCULAR | Status: DC | PRN
  Administered 2023-04-04 – 2023-04-07 (×3): 1 mg via INTRAVENOUS
  Filled 2023-04-04 (×3): qty 1

## 2023-04-04 MED ORDER — ROCURONIUM BROMIDE 10 MG/ML (PF) SYRINGE
PREFILLED_SYRINGE | INTRAVENOUS | Status: DC | PRN
  Administered 2023-04-04: 70 mg via INTRAVENOUS
  Administered 2023-04-04: 10 mg via INTRAVENOUS
  Administered 2023-04-04: 20 mg via INTRAVENOUS

## 2023-04-04 MED ORDER — METHOCARBAMOL 500 MG PO TABS: 1000.0000 mg | ORAL_TABLET | Freq: Four times a day (QID) | ORAL | Status: DC | PRN

## 2023-04-04 MED ORDER — METOPROLOL TARTRATE 5 MG/5ML IV SOLN: 5.0000 mg | Freq: Four times a day (QID) | INTRAVENOUS | Status: DC | PRN

## 2023-04-04 MED ORDER — VITAMIN D 25 MCG (1000 UNIT) PO TABS
1000.0000 [IU] | ORAL_TABLET | ORAL | Status: DC
  Administered 2023-04-06 – 2023-04-09 (×3): 1000 [IU] via ORAL
  Filled 2023-04-04 (×4): qty 1

## 2023-04-04 MED ORDER — FLUTICASONE PROPIONATE 50 MCG/ACT NA SUSP
1.0000 | Freq: Every day | NASAL | Status: DC | PRN
  Administered 2023-04-08: 1 via NASAL
  Filled 2023-04-04: qty 16

## 2023-04-04 MED ORDER — BUPIVACAINE LIPOSOME 1.3 % IJ SUSP
INTRAMUSCULAR | Status: AC
  Filled 2023-04-04: qty 20

## 2023-04-04 MED ORDER — PHENYLEPHRINE 80 MCG/ML (10ML) SYRINGE FOR IV PUSH (FOR BLOOD PRESSURE SUPPORT)
PREFILLED_SYRINGE | INTRAVENOUS | Status: AC
  Filled 2023-04-04: qty 10

## 2023-04-04 MED ORDER — ACETAMINOPHEN 500 MG PO TABS
1000.0000 mg | ORAL_TABLET | Freq: Four times a day (QID) | ORAL | Status: DC
  Administered 2023-04-04 – 2023-04-09 (×15): 1000 mg via ORAL
  Filled 2023-04-04 (×17): qty 2

## 2023-04-04 MED ORDER — ONDANSETRON HCL 4 MG/2ML IJ SOLN: 4.0000 mg | Freq: Once | INTRAMUSCULAR | Status: DC | PRN

## 2023-04-04 MED ORDER — LACTATED RINGERS IV BOLUS: 1000.0000 mL | Freq: Once | INTRAVENOUS | Status: AC

## 2023-04-04 MED ORDER — BUPIVACAINE-EPINEPHRINE 0.25% -1:200000 IJ SOLN
INTRAMUSCULAR | Status: AC
  Filled 2023-04-04: qty 1

## 2023-04-04 MED ORDER — ALBUMIN HUMAN 5 % IV SOLN
INTRAVENOUS | Status: AC
  Filled 2023-04-04: qty 250

## 2023-04-04 MED ORDER — PROCHLORPERAZINE MALEATE 10 MG PO TABS: 10.0000 mg | ORAL_TABLET | Freq: Four times a day (QID) | ORAL | Status: DC | PRN

## 2023-04-04 MED ORDER — MIDAZOLAM HCL 2 MG/2ML IJ SOLN
INTRAMUSCULAR | Status: AC
  Filled 2023-04-04: qty 2

## 2023-04-04 MED ORDER — EPHEDRINE 5 MG/ML INJ
INTRAVENOUS | Status: AC
  Filled 2023-04-04: qty 10

## 2023-04-04 MED ORDER — ONDANSETRON HCL 4 MG/2ML IJ SOLN
4.0000 mg | Freq: Four times a day (QID) | INTRAMUSCULAR | Status: DC | PRN
  Administered 2023-04-05 – 2023-04-07 (×2): 4 mg via INTRAVENOUS
  Filled 2023-04-04 (×2): qty 2

## 2023-04-04 MED ORDER — LEVOTHYROXINE SODIUM 75 MCG PO TABS
150.0000 ug | ORAL_TABLET | Freq: Every day | ORAL | Status: DC
  Administered 2023-04-05 – 2023-04-09 (×5): 150 ug via ORAL
  Filled 2023-04-04 (×5): qty 2

## 2023-04-04 MED ORDER — LACTATED RINGERS IV SOLN: Freq: Three times a day (TID) | INTRAVENOUS | Status: AC | PRN

## 2023-04-04 SURGICAL SUPPLY — 56 items
APL PRP STRL LF DISP 70% ISPRP (MISCELLANEOUS) ×1
APPLIER CLIP 5 13 M/L LIGAMAX5 (MISCELLANEOUS)
APR CLP MED LRG 5 ANG JAW (MISCELLANEOUS)
BAG COUNTER SPONGE SURGICOUNT (BAG) ×1 IMPLANT
BAG SPNG CNTER NS LX DISP (BAG) ×1
BINDER ABDOMINAL 12 ML 46-62 (SOFTGOODS) IMPLANT
CABLE HIGH FREQUENCY MONO STRZ (ELECTRODE) ×1 IMPLANT
CANISTER WOUNDNEG PRESSURE 500 (CANNISTER) IMPLANT
CHLORAPREP W/TINT 26 (MISCELLANEOUS) ×1 IMPLANT
CLIP APPLIE 5 13 M/L LIGAMAX5 (MISCELLANEOUS) IMPLANT
COVER SURGICAL LIGHT HANDLE (MISCELLANEOUS) ×1 IMPLANT
DEVICE SECURE STRAP 25 ABSORB (INSTRUMENTS) ×1 IMPLANT
DEVICE TROCAR PUNCTURE CLOSURE (ENDOMECHANICALS) ×1 IMPLANT
DRAIN CHANNEL 19F RND (DRAIN) IMPLANT
DRAIN RELI 100 BL SUC LF ST (DRAIN)
DRAPE INCISE IOBAN 66X45 STRL (DRAPES) IMPLANT
DRAPE WARM FLUID 44X44 (DRAPES) ×1 IMPLANT
DRESSING PREVENA PLUS CUSTOM (GAUZE/BANDAGES/DRESSINGS) IMPLANT
DRSG PREVENA PLUS CUSTOM (GAUZE/BANDAGES/DRESSINGS) ×1
DRSG TEGADERM 2-3/8X2-3/4 SM (GAUZE/BANDAGES/DRESSINGS) ×3 IMPLANT
DRSG TEGADERM 4X4.75 (GAUZE/BANDAGES/DRESSINGS) ×1 IMPLANT
ELECT REM PT RETURN 15FT ADLT (MISCELLANEOUS) ×1 IMPLANT
EVACUATOR SILICONE 100CC (DRAIN) IMPLANT
GAUZE SPONGE 2X2 8PLY STRL LF (GAUZE/BANDAGES/DRESSINGS) IMPLANT
GLOVE ECLIPSE 8.0 STRL XLNG CF (GLOVE) ×1 IMPLANT
GLOVE INDICATOR 8.0 STRL GRN (GLOVE) ×1 IMPLANT
GOWN STRL REUS W/ TWL XL LVL3 (GOWN DISPOSABLE) ×3 IMPLANT
GOWN STRL REUS W/TWL XL LVL3 (GOWN DISPOSABLE) ×3
IRRIG SUCT STRYKERFLOW 2 WTIP (MISCELLANEOUS)
IRRIGATION SUCT STRKRFLW 2 WTP (MISCELLANEOUS) IMPLANT
KIT BASIN OR (CUSTOM PROCEDURE TRAY) ×1 IMPLANT
KIT DRSG PREVENA PLUS 7DAY 125 (MISCELLANEOUS) IMPLANT
KIT TURNOVER KIT A (KITS) IMPLANT
LIGASURE IMPACT 36 18CM CVD LR (INSTRUMENTS) IMPLANT
MARKER SKIN DUAL TIP RULER LAB (MISCELLANEOUS) ×1 IMPLANT
MESH VENTRALIGHT ST 10X13IN (Mesh General) IMPLANT
NDL INSUFFLATION 14GA 120MM (NEEDLE) IMPLANT
NDL SPNL 22GX3.5 QUINCKE BK (NEEDLE) IMPLANT
NEEDLE INSUFFLATION 14GA 120MM (NEEDLE) ×1
NEEDLE SPNL 22GX3.5 QUINCKE BK (NEEDLE)
PAD POSITIONING PINK XL (MISCELLANEOUS) ×1 IMPLANT
SCISSORS LAP 5X35 DISP (ENDOMECHANICALS) ×1 IMPLANT
SET TUBE SMOKE EVAC HIGH FLOW (TUBING) ×1 IMPLANT
SLEEVE ADV FIXATION 5X100MM (TROCAR) ×1 IMPLANT
SPIKE FLUID TRANSFER (MISCELLANEOUS) ×1 IMPLANT
STRIP CLOSURE SKIN 1/2X4 (GAUZE/BANDAGES/DRESSINGS) ×2 IMPLANT
SUT MNCRL AB 4-0 PS2 18 (SUTURE) ×1 IMPLANT
SUT PDS AB 1 CT1 27 (SUTURE) ×5 IMPLANT
SUT PROLENE 1 CT 1 30 (SUTURE) IMPLANT
SUT PROLENE 2 0 SH DA (SUTURE) IMPLANT
SUT VIC AB 3-0 SH 18 (SUTURE) IMPLANT
TOWEL OR 17X26 10 PK STRL BLUE (TOWEL DISPOSABLE) ×1 IMPLANT
TRAY LAPAROSCOPIC (CUSTOM PROCEDURE TRAY) ×1 IMPLANT
TROCAR 11X100 Z THREAD (TROCAR) IMPLANT
TROCAR ADV FIXATION 5X100MM (TROCAR) ×1 IMPLANT
TROCAR XCEL NON-BLD 5MMX100MML (ENDOMECHANICALS) ×1 IMPLANT

## 2023-04-04 NOTE — Anesthesia Postprocedure Evaluation (Signed)
Anesthesia Post Note  Patient: Jacqueline Mora  Procedure(s) Performed: LAPAROSCOPIC VENTRAL WALL HERNIA WITH LYSIS OF ADHESIONS AND PANNICULECTOMY     Patient location during evaluation: PACU Anesthesia Type: General Level of consciousness: awake and alert and oriented Pain management: pain level controlled Vital Signs Assessment: post-procedure vital signs reviewed and stable Respiratory status: spontaneous breathing, nonlabored ventilation and respiratory function stable Cardiovascular status: blood pressure returned to baseline and stable Postop Assessment: no apparent nausea or vomiting Anesthetic complications: no   No notable events documented.  Last Vitals:  Vitals:   04/04/23 1805 04/04/23 1918  BP: 130/76 (!) 122/97  Pulse: (!) 101 97  Resp: 18 18  Temp: (!) 36.4 C   SpO2: 96% 100%    Last Pain:  Vitals:   04/04/23 1918  TempSrc:   PainSc: 2                  , A.

## 2023-04-04 NOTE — Op Note (Signed)
04/04/2023  PATIENT:  Jacqueline Mora  67 y.o. 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 SAC  PROCEDURE:   LAPAROSCOPIC REPAIR OF ABDOMINAL HERNIA WITH MESH  (See OR Findings below) PANNICULECTOMY TAP BLOCK - BILATERAL  SURGEON:  Ardeth Sportsman, MD  ASSISTANT:  (n/a)   ANESTHESIA:  General endotracheal intubation anesthesia (GETA) and Regional TRANSVERSUS ABDOMINIS PLANE (TAP) nerve block -BILATERAL for perioperative & postoperative pain control at the level of the transverse abdominis & preperitoneal spaces along the flank at the anterior axillary line, from subcostal ridge to iliac crest under laparoscopic guidance provided with liposomal bupivacaine (Experel) 20mL mixed with 50 mL of bupivicaine 0.25% with epinephrine  Estimated Blood Loss (EBL):   Total I/O In: 1250 [I.V.:1000; IV Piggyback:250] Out: 400 [Blood:400].   (See anesthesia record)  Delay start of Pharmacological VTE agent (>24hrs) due to concerns of significant anemia, surgical blood loss, or risk of bleeding?:  no  DRAINS: Prevena incisional wound VAC covers panniculus.  SPECIMEN: Panniculus with chronic hernia sac and omentum  DISPOSITION OF SPECIMEN:  Pathology  COUNTS:  Sponge, needle, & instrument counts CORRECT  PLAN OF CARE: Admit to inpatient   PATIENT DISPOSITION:  PACU - hemodynamically stable.  INDICATION: Pleasant patient with history of need for emergency abdominal surgeries and hernia repairs with mesh infection  removal and recurrence of her a decade ago.  She is quit smoking try to lose some weight.  She has worsening pain discomfort with omentum and some transverse colon incarcerated with hernia.  History of prior need for bowel resection and hernia/mesh infection.  Worsening symptoms.  Recommendation was made for surgical repair  The anatomy & physiology of the abdominal wall was discussed. The pathophysiology of hernias was discussed. Natural history risks without surgery including progeressive enlargement, pain, incarceration & strangulation was discussed. Contributors to complications such as smoking, obesity, diabetes, prior surgery, etc were discussed.  I feel the risks of no intervention will lead to serious problems that outweigh the operative risks; therefore, I recommended surgery to reduce and repair the hernia. I explained laparoscopic techniques with possible need for an open approach. I noted the probable use of mesh to patch and/or buttress the hernia repair.  Risks such as bleeding, infection, abscess, need for further treatment, heart attack, death, and other risks were discussed. I noted a good likelihood this will help address the problem. Goals of post-operative recovery were discussed as well. Possibility that this will not correct all symptoms was explained. I stressed the importance of low-impact activity, aggressive pain control, avoiding constipation, & not pushing through pain to minimize risk of post-operative chronic pain or injury. Possibility of reherniation especially with smoking, obesity, diabetes, immunosuppression, and other health conditions was discussed. We will work to minimize complications.   An educational handout further explaining the pathology & treatment options was given as well. Questions were answered. The patient expresses understanding & wishes to proceed with surgery.   OR FINDINGS: Swiss cheese incisional hernia primarily super and periumbilical incarcerated  with most of the greater omentum as well as mid  transverse colon.  18 x 11 cm region total.  Old spiral metal tacks and sutures noted from prior mesh repair.  Those were excised.  No remaining mesh.  Hernia location: Periumbilical  Hernia type: Recurrent incisional - Incarcerated  Hernia size - largest dimension 18cm x 11cm  Type of repair: Laparoscopic underlay repair.  Primary repair of largest hernia   Placement of mesh: Centrally intraperitoneal with edges tucked into RECTRORECTUS & preperitoneal space  Name of mesh: Bard Ventralight dual sided (polypropylene / Seprafilm)  Size of mesh: 33x27cm  Orientation: Transverse  Mesh overlap:  5-7cm   DESCRIPTION:   Informed consent was confirmed. The patient underwent general anaesthesia without difficulty. The patient was positioned appropriately. VTE prevention in place. The patient's abdomen was clipped, prepped, & draped in a sterile fashion. Surgical timeout confirmed our plan.  The patient was positioned in reverse Trendelenburg. Abdominal entry was gained using optical entry technique in the left upper abdomen. Entry was clean. I induced carbon dioxide insufflation. Camera inspection revealed no injury. Extra ports were carefully placed under direct laparoscopic visualization.   I could see adhesions on the parietal peritoneum under the abdominal wall.  I did laparoscopic lysis of adhesions to expose the entire anterior abdominal wall.  I primarily used focused sharp dissection.  Eventually encountered a large Swiss cheese incisional type hernia with the largest defect periumbilical and continuing cephalad.  Very dense adhesions in the hernia sac into the fascia itself.  Carefully freed these off and gradually reduced the grapelike cluster hernia sacs and pockets.  Got transverse colon down it was viable.  It was served mostly omentum.  Some bleeding on the mentating noted it controlled with touch point cautery.  I freed off the falciform  ligament and central peritoneum to expose the retrorectus fascia.  Patient had old metal tacks as well as old blue permanent sutures along the hernia sac and the edges.  I meticulously removed all those to make sure there are no old tissue given her history of prior mesh infection.  There is no open the abscess at this time for inflammation.  I freed the infraumbilical peritoneum transversely and came down to the pubis to free the bladder down to make sure had good overlap.  I made sure hemostasis was good.  I mapped out the region using a needle passer.   To ensure that I would have at least 5 cm radial coverage outside of the hernia defect, I chose a 33x27cm dual sided mesh.  I placed #1 Prolene stitches around its edge about every 5 cm = 10 total, sparing the inferior ridge..  I rolled the mesh & placed into the peritoneal cavity through the hernia defect.  I unrolled the mesh and positioned it appropriately.  I secured the mesh to cover up the hernia defect using a laparoscopic suture passer to pass the tails of the Prolene through the abdominal wall & tagged them with clamps for good transfascial suturing.  I started out in four corners to make sure I had the mesh centered under the hernia defect appropriately, and then proceeded to work in quadrants.  Worked to have the inferior edge of the mesh to be a few centimeters inferior to the pubic rim for good overlap.  Upper mesh came into the supraumbilical region.  Abnormal 1 PDS sutures at the suprapubic ramus through the mesh and tacking up the preperitoneum above the bladder dome to help tack it back up.  There is a tacker at  the edges and paramedian to help secure the mesh well.  Did copious irrigation.  Still some oozing on the omentum centrally but were able to meticulously isolate and control output touch point cautery to good result, staying away from transverse colon.  Most of the omentum was able to be reduced down and looked healthy and viable.  It  had good coverage between the small bowel and the mesh.  We evacuated CO2 & desufflated the abdomen.  I tied the subxiphoid and suprapubic transverse stitches down.  Because she had a large panniculus with a chronic hernia sac with some ischemic omentum within it, decided to proceed with panniculectomy to get rid of the chronically irritated hernia sac that then for [place] with decades with prior infection to get healthier edges.  I made a transverse incision supraumbilically to the left axillary line flank towards the right axillary line.  Did another incision a few centimeters superior to the suprapubic edge and came through.  Came through the chronic hernia sac centrally and remove the panniculus using cautery through the dermis and a bipolar LigaSure through the subcutaneous tissues and off the fascia.  Debrided and freed off old hernia sac to let healthy tissue make sure there is no remaining mesh or other concerns.  Tissues looked more viable.  Excise some excess skin and fat within the panniculus and abdominal wall until the flaps came together with minimal tension and good healthy edges.  Created flaps superiorly and inferiorly around the central fascial defect.  I closed the periumbilical fascial defect that I placed the mesh through using #1 PDS interrupted transverse stitches primarily.  Use stitches to help tack the mesh as well to help buttress the repair and closure.  Assured hemostasis.  I closed the long colectomy wound using 3-0 Vicryl interrupted deep dermal stitches and running 3-0 Monocryl suture to good result.  A Prevena incisional wound VAC was placed over that and secured.  Puncture sites from the fascial stitches were covered with Steri-Strips.  Remaining left upper quadrant port site was closed and sterile dressings applied.      Patient is being extubated to go to the recovery room.  I discussed operative findings, updated the patient's status, discussed probable steps to recovery,  and gave postoperative recommendations to the  patient's daughter, Jacqueline Mora .  Recommendations were made.  Questions were answered.  She expressed understanding & appreciation.  Ardeth Sportsman, M.D., F.A.C.S. Gastrointestinal and Minimally Invasive Surgery [facility] [address] [place], [place] [address] [phone] Main / Paging  04/04/2023 4:05 PM

## 2023-04-04 NOTE — Transfer of Care (Signed)
Immediate Anesthesia Transfer of Care Note  Patient: Jacqueline Mora  Procedure(s) Performed: Procedure(s): LAPAROSCOPIC VENTRAL WALL HERNIA WITH LYSIS OF ADHESIONS AND PANNICULECTOMY (N/A)  Patient Location: PACU  Anesthesia Type:General  Level of Consciousness:  sedated, patient cooperative and responds to stimulation  Airway & Oxygen Therapy:Patient Spontanous Breathing and Patient connected to face mask oxgen  Post-op Assessment:  Report given to PACU RN and Post -op Vital signs reviewed and stable  Post vital signs:  Reviewed and stable  Last Vitals:  Vitals:   04/04/23 0915  BP: (!) 157/81  Pulse: 86  Resp: 16  Temp: 36.6 C  SpO2: 95%    Complications: No apparent anesthesia complications

## 2023-04-04 NOTE — H&P (Signed)
04/04/2023   REFERRING PHYSICIAN: Jarrett Soho, PA  Patient Care Team: Jarrett Soho, [place] as PCP - General (Family Medicine) Michaell Cowing, Shawn Route, MD as Consulting Provider (General Surgery) Christell Constant, MD (Cardiovascular Disease)  PROVIDER: Jarrett Soho, MD  [facility] MRN: [id number] DOB: 1956/04/20  SUBJECTIVE   Chief Complaint: New Consultation and Hernia   Jacqueline Mora is a 67 y.o. female  who is seen today as an office consultation  at the request of DrDelena Serve  for evaluation of hernia   History of Present Illness:  Pleasant morbidly obese woman with numerous abdominal operations. I had seen her 9 years ago with concerns of a periumbilical incisional hernia. She had a lot of prior surgeries as noted below. Recommend a CAT scan. Felt she would benefit from repair of her incisional hernia. I offered this in 2015. I think she was interested in the second opinion down at [facility] with Dr. Anda Kraft which was arranged in early 2016. Discussion about attempted weight loss with hernia repair and possible panniculectomy. She held off. We have not seen her since. She does not remember with me. She does not remember that Dr. Anda Kraft had discussed hernia repair. She thought they were only talking about a panniculectomy. Notes that hernia is may be slightly larger but not has not caused much issues. However she had episode of right-sided sharp abdominal pain that concerned her. She had more complaints concerning. Had a CAT scan done which confirmed incisional hernia containing some transverse colon. Surgical consultation offered.  She remains morbidly obese. Weight was 282 nine years ago. It is 283 today, essentially same. Looks like she is been trying to do weight loss issues but a struggle. She has had worsening joint issues that is limiting her ability to exercise. Patient notes her hernia not change much inside. She usually moves her bowels once or twice a  day. She has been less mobile over the past few years due to rotator cuff and ankle issues. Currently needing a boot. She has had follow-up colonoscopies for her colorectal cancer with no recurrence as of 3 years ago. That was Dr. Kinnie Scales who is since retired. I do not think she is established with another gastroenterologist yet. She thinks she has a 5-year follow-up due = 2026. She had nuclear medicine stress test showing no perfusion defects in 2022 and a CT scan showing a okay calcium score. She is terrified of getting another episode of bowel incarcerated into hernia needing emergency surgery like over 15 years ago. The sharp pains make her think like it is happening again.  PRIOR NOTE 07/12/2014 The patient is a 67 year old female who presents with an incisional hernia. 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 Had incarcerated umbilical hernia. Underwent reduction and primary repair 1999. Had laparotomy and bowel resection for recurrent small bowel obstrcutionn 2000 Dr Earlene Plater. Underwent laparoscopic repair of recurrent ventral hernia with mesh 2005 Dr Ezzard Standing. Was not candidate for bariatric surgery due to cost and lack of insurance coverage. Was found to have cancer within removed polyp. Underwent laparoscopic low anterior resection 2006 Dr Ezzard Standing. Complicated by wound infection and then mesh infection. Underwent laparoscopic removal of mesh a few months later. No surgery since 2006  Lost to followup. She no longer smokes. She is down to 40 pounds from her last operation. No major cardiac or urinary problems. She did go into renal failure with emergency surgery 2006.  That seems to have resolved. She is been trying to lose weight. Started exercising more aggressively. Lost 50 pounds. QUIT smoking! Felt a pulling sensation. Worsening swelling. Concern for recurrent hernia. Comes for evaluation. Followed  by Dr. Zachery Dauer with [facility]. Last colonoscopy a few years ago showed no recurrence. However, it she has noticed worsening bouts of intermittent constipation and diarrhea. This concerned her. Seeing Dr Kinnie Scales to consider repeat colonoscopy. Pulling and pain and discomfort. Worried that she may end up and another emergency situation. Afraid to have any more hernia repair also. She comes to me to consider need for surgical repair.   Medical History:  Past Medical History:  Diagnosis Date  Anemia  Anxiety  GERD (gastroesophageal reflux disease)  Heart valve disease  History of cancer  Hypertension  Thyroid disease   Patient Active Problem List  Diagnosis  Recurrent incisional hernia with incarceration  Morbid obesity with body mass index of 40.0-49.9 (CMS/HHS-HCC)  Adjustment insomnia  Dizziness  DOE (dyspnea on exertion)  Essential hypertension   Past Surgical History:  Procedure Laterality Date  CESAREAN SECTION  COLONOSCOPY  HERNIA REPAIR  HYSTERECTOMY    Allergies  Allergen Reactions  Adhesive Itching  Adhesive on medical tape - requires paper tape.  Amlodipine Besylate Other (See Comments)  Dizziness  Clindamycin Hcl Other (See Comments)  Other reaction(s): rash  Penicillin G Unknown  Other reaction(s): rash  Simvastatin Unknown   Current Outpatient Medications on File Prior to Visit  Medication Sig Dispense Refill  ALPRAZolam (XANAX) 0.5 MG tablet TAKE ONE-HALF TO ONE TABLET BY MOUTH ONCE DAILY. GENERIC EQUIVALENT FOR XANAX.  cholecalciferol (VITAMIN D3) 1000 unit tablet Take 2,000 Units by mouth  estradioL (ESTRACE) 2 MG tablet Take 2 mg by mouth  fluticasone propionate (FLONASE) 50 mcg/actuation nasal spray USE ONE SPRAY IN EACH NOSTRIL ONCE A DAY  FUROsemide (LASIX) 40 MG tablet Take 40 mg by mouth once daily  hydroCHLOROthiazide (HYDRODIURIL) 25 MG tablet Take 12.5 mg by mouth  ibuprofen (MOTRIN) 800 MG tablet Take 800 mg by mouth every 6 (six) hours as needed   ketoconazole (NIZORAL) 2 % cream APPLY TOPICALLY DAILY AS NEEDED FOR FLARES  levothyroxine (SYNTHROID) 200 MCG tablet Take by mouth  meloxicam (MOBIC) 15 MG tablet Take 1 tablet by mouth once daily   No current facility-administered medications on file prior to visit.   No family history on file.   Social History   Tobacco Use  Smoking Status Former  Types: Cigarettes  Smokeless Tobacco Never    Social History   Socioeconomic History  Marital status: Widowed  Tobacco Use  Smoking status: Former  Types: Cigarettes  Smokeless tobacco: Never  Substance and Sexual Activity  Alcohol use: Yes  Drug use: Never   ############################################################  Review of Systems: A complete review of systems (ROS) was obtained from the patient.  We have reviewed this information and discussed as appropriate with the patient.  See HPI as well for other pertinent ROS.  Constitutional: No fevers, chills, sweats. Weight stable Eyes: No vision changes, No discharge HENT: No sore throats, nasal drainage Lymph: No neck swelling, No bruising easily Pulmonary: No cough, productive sputum CV: No orthopnea, PND . No exertional chest/neck/shoulder/arm pain. Patient can walk 10 minutes gradually.   GI: No personal nor family history of GI/colon cancer, inflammatory bowel disease, irritable bowel syndrome, allergy such as Celiac Sprue, dietary/dairy problems, colitis, ulcers nor gastritis. No recent sick contacts/gastroenteritis. No travel outside the country. No changes in diet.  Renal:  No UTIs, No hematuria Genital: No drainage, bleeding, masses Musculoskeletal: Some chronic right shoulder soreness. Struggling with right ankle issues in a boot. No severe joint pain. Good ROM major joints Skin: No sores or lesions Heme/Lymph: No easy bleeding. No swollen lymph nodes Neuro: No active seizures. No facial droop Psych: No hallucinations. No agitation  OBJECTIVE   Vitals:   01/28/23 1012  BP: 139/84  Pulse: 100  Temp: 36.9 C (98.4 F)  SpO2: 98%  Weight: (!) 128.4 kg (283 lb)  Height: 167.6 cm (5\' 6" )  PainSc: 0-No pain   Body mass index is 45.68 kg/m.  PHYSICAL EXAM:  Constitutional: Not cachectic. Hygeine adequate. Vitals signs as above.  Eyes: No glasses. Vision adequate,Pupils reactive, normal extraocular movements. Sclera nonicteric Neuro: CN II-XII intact. No major focal sensory defects. No major motor deficits. Lymph: No head/neck/groin lymphadenopathy Psych: No severe agitation. Somewhat anxious and tearful but consolable. Judgment & insight Adequate, Oriented x4, HENT: Normocephalic, Mucus membranes moist. No thrush. Hearing: adequate Neck: Supple, No tracheal deviation. No obvious thyromegaly Chest: No pain to chest wall compression. Good respiratory excursion. No audible wheezing CV: Pulses intact. regular. No major extremity edema Ext: No obvious deformity or contracture. Edema: Not present. No cyanosis Skin: No major subcutaneous nodules. Warm and dry Musculoskeletal: Severe joint rigidity not present. No obvious clubbing. No digital petechiae. Boot on right foot mobility: no assist device moving easily without restrictions  Abdomen: Obese with panniculus Soft. Nondistended. Tenderness at right paramedian region mild. [name] Hernia: Periumbilical 13 x 7 cm region of swelling consistent with incisional hernia. Not reducible. Particularly tender there. Diastasis recti: . No hepatomegaly. No splenomegaly.  Genital/Pelvic: Inguinal hernia: Not present. Inguinal lymph nodes: without lymphadenopathy nor hidradenitis.   Rectal: (Deferred)    ###################################################################  Labs, Imaging and Diagnostic Testing:  Located in 'Care Everywhere' section of Epic EMR chart  PRIOR [facility] NOTES:  Located in 'Care Everywhere' section of Epic EMR chart  SURGERY NOTES:  Located in 'Care Everywhere' section  of Epic EMR chart  PATHOLOGY:  Located in 'Care Everywhere' section of Epic EMR chart  Assessment and Plan:  DIAGNOSES:  Diagnoses and all orders for this visit:  Recurrent incisional hernia with incarceration  Morbid obesity with body mass index of 40.0-49.9 (CMS/HHS-HCC)    ASSESSMENT/PLAN  Pleasant but anxious woman with recurrent incisional hernia incarcerated with colon with recent episodes of pain with activity. No nausea or vomiting.  Standard of care is to consider recurrent repair. Laparoscopic adhesions with reduction underlay repair with mesh. Most likely going to have to use giant sheet given her obesity and recurrence. Try and do it when it is not emergency like last time. Use Ventralight as opposed to Gore-Tex mesh so hopefully incorporation is better and risk of delayed infection less. I suspect she may need a partial central panniculectomy as well to get rid of the large hernia sac if needed.   The anatomy & physiology of the abdominal wall was discussed. The pathophysiology of hernias was discussed. Natural history risks without surgery including progeressive enlargement, pain, incarceration, & strangulation was discussed. Contributors to complications such as smoking, obesity, diabetes, prior surgery, etc were discussed.   I feel the risks of no intervention will lead to serious problems that outweigh the operative risks; therefore, I recommended surgery to reduce and repair the hernia. I explained laparoscopic techniques with possible need for an open approach. I noted the probable use of mesh to patch and/or buttress the hernia repair  Risks such as bleeding, infection, abscess, need for further treatment, injury to other organs, need for repair of tissues / organs, stroke, heart attack, death, and other risks were discussed. I noted a good likelihood this will help address the problem. Goals of post-operative recovery were discussed as well. Possibility that this will  not correct all symptoms was explained. I stressed the importance of low-impact activity, aggressive pain control, avoiding constipation, & not pushing through pain to minimize risk of post-operative chronic pain or injury. Possibility of reherniation especially with smoking, obesity, diabetes, immunosuppression, and other health conditions was discussed. We will work to minimize complications.   An educational handout further explaining the pathology & treatment options was given as well. Questions were answered. The patient expresses understanding & wishes to proceed with surgery.

## 2023-04-04 NOTE — Progress Notes (Addendum)
MD Gross notified that patient is a yellow MEWs because her HR is 107 & BP is 83/68. Patient able to answer to have a conversation and answer questions but his lethargic. Yellow MEWs protocol was initiated. LR Bolus PRN q8hrs for SBP less than 90 started.

## 2023-04-04 NOTE — Interval H&P Note (Signed)
History and Physical Interval Note:  04/04/2023 9:42 AM  Jacqueline Mora  has presented today for surgery, with the diagnosis of VENTRAL INCISIONAL REOCRRUING AND INCARCERATED HERNIA.  The various methods of treatment have been discussed with the patient and family. After consideration of risks, benefits and other options for treatment, the patient has consented to  Procedure(s): LAPAROSCOPIC VENTRAL WALL HERNIA WITH LYSIS OF ADHESIONS (N/A) as a surgical intervention.  The patient's history has been reviewed, patient examined, no change in status, stable for surgery.  I have reviewed the patient's chart and labs.  Questions were answered to the patient's satisfaction.     I have re-reviewed the the patient's records, history, medications, and allergies.  I have re-examined the patient.  I again discussed intraoperative plans and goals of post-operative recovery.  The patient agrees to proceed.  Jacqueline Mora  Sep 26, 1955 [id number]  Patient Care Team: Jarrett Soho, PA-C as PCP - General (Family Medicine) Christell Constant, MD as PCP - Cardiology (Cardiology) Karie Soda, MD as Consulting Physician (General Surgery)  Patient Active Problem List   Diagnosis Date Noted   Elevated coronary artery calcium score 02/20/2023   Personal history of colon cancer 01/28/2023   DOE (dyspnea on exertion) 12/08/2020   Morbid obesity (HCC) 12/08/2020   Essential hypertension 12/08/2020   Hyperlipidemia 12/08/2020   Chest pain of uncertain etiology 12/08/2020   Muscle weakness (generalized) 03/20/2020   Malignant melanoma (HCC) 12/07/2019   Migraine 12/04/2019   Genital herpes simplex 12/04/2019   Constipation 09/02/2018   Dizziness 01/29/2018   Post-concussion syndrome 01/29/2018   Intractable post-traumatic headache 06/19/2017   Adjustment insomnia 06/19/2017    Past Medical History:  Diagnosis Date   Abdominal hernia    Anemia    Anxiety    BMI 45.0-49.9, adult (HCC)     Cancer (HCC)    Colon cancer (HCC)    COVID-19    Depression    GERD (gastroesophageal reflux disease)    History of colon polyps    Hypercholesteremia    Hypertension    occ   Hypothyroidism    IBS (irritable bowel syndrome)    Mitral valve problem    PONV (postoperative nausea and vomiting)    Prediabetes    Squamous cell carcinoma in situ (SCCIS) of skin    Thyroid disease    Vitamin D deficiency     Past Surgical History:  Procedure Laterality Date   ABDOMINAL HYSTERECTOMY     BLADDER NECK SUSPENSION     CESAREAN SECTION     x 2   colon cancer     COLONOSCOPY     HERNIA REPAIR     Mesh placed then later removed due to infection.   laparostomy      Social History   Socioeconomic History   Marital status: Widowed    Spouse name: Not on file   Number of children: 2   Years of education: 4 yrs college   Highest education level: Not on file  Occupational History   Occupation: Retired  Tobacco Use   Smoking status: Former    Types: Cigarettes   Smokeless tobacco: Never   Tobacco comments:    Quit 1984  Vaping Use   Vaping status: Never Used  Substance and Sexual Activity   Alcohol use: Not Currently    Comment: occasionally   Drug use: No   Sexual activity: Not on file  Other Topics Concern   Not on file  Social History  Narrative   Lives at home with husband.   Right-handed.   4-5 cups unsweet tea per week.   Social Determinants of Health   Financial Resource Strain: Not on file  Food Insecurity: Not on file  Transportation Needs: Not on file  Physical Activity: Not on file  Stress: Not on file  Social Connections: Not on file  Intimate Partner Violence: Not on file    Family History  Problem Relation Age of Onset   Kidney cancer Mother    Diabetes Father     Medications Prior to Admission  Medication Sig Dispense Refill Last Dose   ALPRAZolam (XANAX) 0.5 MG tablet Take 0.5 mg by mouth as needed for anxiety.      cholecalciferol (VITAMIN  D) 1000 units tablet Take 1,000 Units by mouth 4 (four) times a week.      Cyanocobalamin (B-12 PO) Take 1 tablet by mouth 4 (four) times a week.      estradiol (ESTRACE) 1 MG tablet Take 1 mg by mouth daily.      ezetimibe (ZETIA) 10 MG tablet Take 10 mg by mouth daily.      FLUoxetine (PROZAC) 10 MG capsule Take 30 mg by mouth daily.      fluticasone (FLONASE) 50 MCG/ACT nasal spray Place 1 spray into both nostrils daily as needed for allergies.  5    furosemide (LASIX) 40 MG tablet TAKE 1 TABLET(40 MG TOTAL) BY MOUTH DAILY 90 tablet 2    ketoconazole (NIZORAL) 2 % cream Apply 1 Application topically daily.      levothyroxine (SYNTHROID) 150 MCG tablet Take 150 mcg by mouth daily before breakfast.      losartan (COZAAR) 100 MG tablet Take 100 mg by mouth daily.      RABEprazole (ACIPHEX) 20 MG tablet Take 20 mg by mouth daily.      spironolactone (ALDACTONE) 25 MG tablet TAKE ONE TABLET BY MOUTH DAILY DOSE INCREASE 90 tablet 1    rosuvastatin (CRESTOR) 10 MG tablet Take 1 tablet (10 mg total) by mouth daily. (Patient not taking: Reported on 03/15/2023) 90 tablet 3 Not Taking   WEGOVY 0.25 MG/0.5ML SOAJ Inject 0.25 mg into the skin once a week. (Patient not taking: Reported on 03/15/2023) 2 mL 0 Not Taking    Current Facility-Administered Medications  Medication Dose Route Frequency Provider Last Rate Last Admin   acetaminophen (TYLENOL) tablet 1,000 mg  1,000 mg Oral On Call to OR Karie Soda, MD       bupivacaine liposome (EXPAREL) 1.3 % injection 266 mg  20 mL Infiltration Once Karie Soda, MD       chlorhexidine (PERIDEX) 0.12 % solution 15 mL  15 mL Mouth/Throat Once Leilani Able, MD       Or   Oral care mouth rinse  15 mL Mouth Rinse Once Hatchett, Susann Givens, MD       Chlorhexidine Gluconate Cloth 2 % PADS 6 each  6 each Topical Once Karie Soda, MD       And   Chlorhexidine Gluconate Cloth 2 % PADS 6 each  6 each Topical Once Karie Soda, MD       feeding supplement  (ENSURE PRE-SURGERY) liquid 592 mL  592 mL Oral Once Karie Soda, MD       gabapentin (NEURONTIN) capsule 300 mg  300 mg Oral On Call to OR Karie Soda, MD       gentamicin (GARAMYCIN) 430 mg in dextrose 5 % 50 mL IVPB  5 mg/kg (  Adjusted) Intravenous On Call to OR Karie Soda, MD       lactated ringers infusion   Intravenous Continuous Hatchett, Susann Givens, MD       vancomycin (VANCOREADY) IVPB 1500 mg/300 mL  1,500 mg Intravenous On Call to OR Karie Soda, MD         Allergies  Allergen Reactions   Amlodipine Besylate Other (See Comments)    Dizziness   Eszopiclone     Other reaction(s): Crazy dreams   Tape     Adhesive on medical tape and glue burns skin - requires paper tape.   Zocor [Simvastatin]     myalgias   Clindamycin Hcl Rash   Meloxicam Rash   Methylprednisolone Rash   Penicillin G Rash    BP (!) 157/81   Pulse 86   Temp 97.8 F (36.6 C) (Oral)   Resp 16   SpO2 95%   Labs: No results found for this or any previous visit (from the past 48 hour(s)).  Imaging / Studies: No results found.   Ardeth Sportsman, M.D., F.A.C.S. Gastrointestinal and Minimally Invasive Surgery [facility] [address] [place], [place] [address] [phone] Main / Paging  04/04/2023 9:42 AM    Ardeth Sportsman

## 2023-04-04 NOTE — Discharge Instructions (Addendum)
HERNIA REPAIR: POST OP INSTRUCTIONS  ######################################################################  EAT Gradually transition to a high fiber diet with a fiber supplement over the next few weeks after discharge.  Start with a pureed / full liquid diet (see below)  WALK Walk an hour a day.  Control your pain to do that.    CONTROL PAIN Control pain so that you can walk, sleep, tolerate sneezing/coughing, and go up/down stairs.  HAVE A BOWEL MOVEMENT DAILY Keep your bowels regular to avoid problems.  OK to try a laxative to override constipation.  OK to use an antidairrheal to slow down diarrhea.  Call if not better after 2 tries  CALL IF YOU HAVE PROBLEMS/CONCERNS Call if you are still struggling despite following these instructions. Call if you have concerns not answered by these instructions  ######################################################################    DIET: Follow a light bland diet & liquids the first 24 hours after arrival home, such as soup, liquids, starches, etc.  Be sure to drink plenty of fluids.  Quickly advance to a usual solid diet within a few days.  Avoid fast food or heavy meals as your are more likely to get nauseated or have irregular bowels.  A low-fat, high-fiber diet for the rest of your life is ideal.   Take your usually prescribed home medications unless otherwise directed.  PAIN CONTROL: Pain is best controlled by a usual combination of three different methods TOGETHER: Ice/Heat Over the counter pain medication Prescription pain medication Most patients will experience some swelling and bruising around the hernia(s) such as the bellybutton, groins, or old incisions.  Ice packs or heating pads (30-60 minutes up to 6 times a day) will help. Use ice for the first few days to help decrease swelling and bruising, then switch to heat to help relax tight/sore spots and speed recovery.  Some people prefer to use ice alone, heat alone, alternating  between ice & heat.  Experiment to what works for you.  Swelling and bruising can take several weeks to resolve.   It is helpful to take an over-the-counter pain medication regularly for the first few weeks.  Choose one of the following that works best for you: Naproxen (Aleve, etc)  Two 220mg  tabs twice a day Ibuprofen (Advil, etc) Three 200mg  tabs four times a day (every meal & bedtime) Acetaminophen (Tylenol, etc) 325-650mg  four times a day (every meal & bedtime) A  prescription for pain medication should be given to you upon discharge.  Take your pain medication as prescribed.  If you are having problems/concerns with the prescription medicine (does not control pain, nausea, vomiting, rash, itching, etc), please call us [phone] to see if we need to switch you to a different pain medicine that will work better for you and/or control your side effect better. If you need a refill on your pain medication, please contact your pharmacy.  They will contact our office to request authorization. Prescriptions will not be filled after 5 pm or on week-ends.  AVOID GETTING CONSTIPATED.   Between the surgery and the pain medications, it is common to experience some constipation.  Drink plenty of liquids Take a fiber supplement 2 times day (such as Metamucil, Citrucel, FiberCon, MiraLax, etc) to have a bowel movement every day. If you have not had a BM by 2 days after surgery: -drink liquids only until you have a bowel movement -take MiraLAX 2 doses every 2 hours until you have a bowel movement   Wash / shower every day.  You may  shower over the dressings as they are waterproof.    It is good for closed incisions and even open wounds to be washed every day.  Shower every day.  Short baths are fine.  Wash the incisions and wounds clean with soap & water.    You may leave closed incisions open to air if it is dry.   You may cover the incision with clean gauze & replace it after your daily shower for  comfort.  You have a Prevena incisional wound VAC that is protecting here panniculectomy incision.  Please come back to the [facility] surgery office next Monday 04/15/2023 for nurse visit to remove the sponge and make sure the incision is healing well.      ACTIVITIES as tolerated:   You may resume regular (light) daily activities beginning the next day--such as daily self-care, walking, climbing stairs--gradually increasing activities as tolerated.  Control your pain so that you can walk an hour a day.  If you can walk 30 minutes without difficulty, it is safe to try more intense activity such as jogging, treadmill, bicycling, low-impact aerobics, swimming, etc. Save the most intensive and strenuous activity for last such as sit-ups, heavy lifting, contact sports, etc  Refrain from any heavy lifting or straining until you are off narcotics for pain control.   DO NOT PUSH THROUGH PAIN.  Let pain be your guide: If it hurts to do something, don't do it.  Pain is your body warning you to avoid that activity for another week until the pain goes down. You may drive when you are no longer taking prescription pain medication, you can comfortably wear a seatbelt, and you can safely maneuver your car and apply brakes. You may have sexual intercourse when it is comfortable.   FOLLOW UP in our office Please call [facility] at [phone] to set up an appointment to see your surgeon in the office for a follow-up appointment approximately 2-3 weeks after your surgery. Make sure that you call for this appointment the day you arrive home to insure a convenient appointment time.  9.  If you have disability of FMLA / Family leave forms, please bring the forms to the office for processing.  (do not give to your surgeon).  WHEN TO CALL us [phone]: Poor pain control Reactions / problems with new medications (rash/itching, nausea, etc)  Fever over 101.5 F (38.5 C) Inability to urinate Nausea and/or  vomiting Worsening swelling or bruising Continued bleeding from incision. Increased pain, redness, or drainage from the incision   The clinic staff is available to answer your questions during regular business hours (8:30am-5pm).  Please don't hesitate to call and ask to speak to one of our nurses for clinical concerns.   If you have a medical emergency, go to the nearest emergency room or call 911.  A surgeon from [facility] is always on call at the hospitals in [facility], [place] [address], [address], [place], [place]  [address] ?  [address] [address], [place], [place]   [address] MAIN: [phone] ? TOLL FREE: [phone] ? FAX: [phone] [email or web address]

## 2023-04-05 ENCOUNTER — Encounter (HOSPITAL_COMMUNITY): Payer: Self-pay | Admitting: Surgery

## 2023-04-05 LAB — BASIC METABOLIC PANEL
Anion gap: 12 (ref 5–15)
BUN: 19 mg/dL (ref 8–23)
CO2: 17 mmol/L — ABNORMAL LOW (ref 22–32)
Calcium: 7.7 mg/dL — ABNORMAL LOW (ref 8.9–10.3)
Chloride: 102 mmol/L (ref 98–111)
Creatinine, Ser: 2.44 mg/dL — ABNORMAL HIGH (ref 0.44–1.00)
GFR, Estimated: 21 mL/min — ABNORMAL LOW (ref >60)
Glucose, Bld: 170 mg/dL — ABNORMAL HIGH (ref 70–99)
Potassium: 4.5 mmol/L (ref 3.5–5.1)
Sodium: 131 mmol/L — ABNORMAL LOW (ref 135–145)

## 2023-04-05 LAB — CBC
HCT: 27.5 % — ABNORMAL LOW (ref 36.0–46.0)
Hemoglobin: 8.6 g/dL — ABNORMAL LOW (ref 12.0–15.0)
MCH: 28.6 pg (ref 26.0–34.0)
MCHC: 31.3 g/dL (ref 30.0–36.0)
MCV: 91.4 fL (ref 80.0–100.0)
Platelets: 236 10*3/uL (ref 150–400)
RBC: 3.01 MIL/uL — ABNORMAL LOW (ref 3.87–5.11)
RDW: 13.3 % (ref 11.5–15.5)
WBC: 9.5 10*3/uL (ref 4.0–10.5)
nRBC: 0 % (ref 0.0–0.2)

## 2023-04-05 LAB — MAGNESIUM: Magnesium: 1.7 mg/dL (ref 1.7–2.4)

## 2023-04-05 MED ORDER — CHLORHEXIDINE GLUCONATE CLOTH 2 % EX PADS
6.0000 | MEDICATED_PAD | Freq: Every day | CUTANEOUS | Status: DC
  Administered 2023-04-05 – 2023-04-07 (×3): 6 via TOPICAL

## 2023-04-05 MED ORDER — POTASSIUM CHLORIDE 2 MEQ/ML IV SOLN
INTRAVENOUS | Status: AC
  Filled 2023-04-05: qty 1000

## 2023-04-05 MED ORDER — SODIUM CHLORIDE 0.9 % IV SOLN
125.0000 mg | Freq: Once | INTRAVENOUS | Status: AC
  Administered 2023-04-05: 125 mg via INTRAVENOUS
  Filled 2023-04-05: qty 10

## 2023-04-05 MED ORDER — GABAPENTIN 100 MG PO CAPS
300.0000 mg | ORAL_CAPSULE | Freq: Three times a day (TID) | ORAL | Status: DC
  Administered 2023-04-05 (×3): 300 mg via ORAL
  Filled 2023-04-05 (×3): qty 3

## 2023-04-05 MED ORDER — LACTATED RINGERS IV SOLN: Freq: Three times a day (TID) | INTRAVENOUS | Status: AC | PRN

## 2023-04-05 NOTE — Progress Notes (Signed)
MD Gross notified of BP 82/41 and low urine output from foley . Patient is A&Ox4 and answers when spoken to but sleepy.

## 2023-04-05 NOTE — Plan of Care (Signed)
  Problem: Coping: Goal: Level of anxiety will decrease Outcome: Progressing   Problem: Pain Managment: Goal: General experience of comfort will improve Outcome: Progressing   

## 2023-04-05 NOTE — Progress Notes (Addendum)
1 L NS bolus was started because patient has less than 250 mls urine output from foley in the last 8 hours per PRN standing order.

## 2023-04-05 NOTE — Progress Notes (Signed)
   04/05/23 0938  TOC Brief Assessment  Insurance and Status Reviewed  Patient has primary care physician Yes  Home environment has been reviewed lives alone  Prior level of function: independent  Prior/Current Home Services No current home services  Social Determinants of Health Reivew SDOH reviewed no interventions necessary  Readmission risk has been reviewed Yes  Transition of care needs no transition of care needs at this time

## 2023-04-05 NOTE — Progress Notes (Signed)
04/05/2023  Jacqueline Mora [id number] 1956-01-25  CARE TEAM: PCP: Jarrett Soho, PA-C  Outpatient Care Team: Patient Care Team: Jarrett Soho, PA-C as PCP - General (Family Medicine) Christell Constant, MD as PCP - Cardiology (Cardiology) Karie Soda, MD as Consulting Physician (General Surgery)  Inpatient Treatment Team: Treatment Team:  Karie Soda, MD Dina Rich, RN Helyn Numbers, RN [place], [place], [facility], Femi Peterman, [place], [place]   Problem List:   Principal Problem:   Recurrent incisional hernia Active Problems:   Adjustment insomnia   DOE (dyspnea on exertion)   Morbid obesity (HCC)   Essential hypertension   Hyperlipidemia   Constipation   04/04/2023  POST-OPERATIVE DIAGNOSIS:   VENTRAL INCISIONAL REOCRRUING AND INCARCERATED HERNIA  Dimensions of hernia post-op:  18cm x 11cm PANNICULUS WITH HISTORY OF MESH INFECTION & GIANT HERNIA SAC   PROCEDURE:   LAPAROSCOPIC REPAIR OF ABDOMINAL HERNIA WITH MESH  (See OR Findings below) PANNICULECTOMY TAP BLOCK - BILATERAL   SURGEON:  Ardeth Sportsman, MD  OR FINDINGS:  Swiss cheese incisional hernia primarily super and periumbilical incarcerated with most of the greater omentum as well as mid transverse colon.  18 x 11 cm region total.  Old spiral metal tacks and sutures noted from prior mesh repair.  Those were excised.  No remaining mesh.   Hernia location: Periumbilical   Hernia type: Recurrent incisional - Incarcerated   Hernia size - largest dimension 18cm x 11cm   Type of repair: Laparoscopic underlay repair.  Primary repair of largest hernia              Placement of mesh: Centrally intraperitoneal with edges tucked into RECTRORECTUS & preperitoneal space   Name of mesh: Bard Ventralight dual sided (polypropylene / Seprafilm)   Size of mesh: 33x27cm   Orientation: Transverse   Mesh overlap:  5-7cm  Assessment [facility] Stay = 1 days) 1 Day Post-Op     Fair    Plan:  -ERAS protocol Multimodal pain control -IVF for dehydration -IV iron Hold anticoag x 48hrs Keep on liquids only for now.  Advance if starts having bowel movements Incisional Prevena wound VAC for panniculectomy site.  Keep that and removed most likely in 10/14 days -VTE prophylaxis- SCDs, etc -mobilize as tolerated to help recovery -Disposition:  Disposition:  The patient is from: Home Anticipate discharge to:  Home Anticipated Date of Discharge is:  August 19,2024   Barriers to discharge:  Pending Clinical improvement (more likely than not)  Patient currently is NOT MEDICALLY STABLE for discharge from the hospital from a surgery standpoint.      I reviewed nursing notes, last 24 h vitals and pain scores, last 48 h intake and output, last 24 h labs and trends, and last 24 h imaging results.  I have reviewed this patient's available data, including medical history, events of note, test results, etc as part of my evaluation.   A significant portion of that time was spent in counseling. Care during the described time interval was provided by me.  This care required moderate level of medical decision making.  04/05/2023    Subjective: (Chief complaint)  Patient with soreness.  Sleeping on narcotics.  Decrease blood pressure requiring IV fluids.  Some nausea.  Ondansetron helped but felt a little sleepy  Objective:  Vital signs:  Vitals:   04/04/23 2125 04/04/23 2245 04/05/23 0253 04/05/23 0649  BP: (!) 105/59 107/65 95/62 (!) 82/41  Pulse: (!) 104 (!) 104 [name])  104 76  Resp: 14 18 17 16   Temp: (!) 97.5 F (36.4 C) (!) 97.5 F (36.4 C) (!) 97.5 F (36.4 C) (!) 97.5 F (36.4 C)  TempSrc: Oral Oral Oral   SpO2: 98% 100% 93% 93%    Last BM Date : 04/03/23  Intake/Output   Yesterday:  08/15 0701 - 08/16 0700 In: 1610.9 [P.O.:270; I.V.:3268.6; IV Piggyback:357.6] Out: 1125 [Urine:125; Emesis/NG output:600; Blood:400] This shift:  No  intake/output data recorded.  Bowel function:  Flatus: No  BM:  No  Drain: (No drain)   Physical Exam:  General: Pt awake/alert in no acute distress.  Tired and pale but alert and answers questions Eyes: PERRL, normal EOM.  Sclera clear.  No icterus Neuro: CN II-XII intact w/o focal sensory/motor deficits. Lymph: No head/neck/groin lymphadenopathy Psych:  No delerium/psychosis/paranoia.  Oriented x 4 HENT: Normocephalic, Mucus membranes moist.  No thrush Neck: Supple, No tracheal deviation.  No obvious thyromegaly Chest: No pain to chest wall compression.  Good respiratory excursion.  No audible wheezing CV:  Pulses intact.  Regular rhythm.  No major extremity edema MS: Normal AROM mjr joints.  No obvious deformity  Abdomen: Soft.  Mildy distended.  Mildly tender at incisions only.  No evidence of peritonitis.  No incarcerated hernias. Mild ecchymosis.  Minimal drainage at suprapubic puncture sites and Steri-Strips  Prevena incisional wound VAC in place with minimal serosanguineous output Ext:   No deformity.  No mjr edema.  No cyanosis Skin: No petechiae / purpurea.  No major sores.  Warm and dry    Results:   Cultures: No results found for this or any previous visit (from the past 720 hour(s)).  Labs: Results for orders placed or performed during the hospital encounter of 04/04/23 (from the past 48 hour(s))  Basic metabolic panel     Status: Abnormal   Collection Time: 04/05/23  7:00 AM  Result Value Ref Range   Sodium 131 (L) 135 - 145 mmol/L   Potassium 4.5 3.5 - 5.1 mmol/L   Chloride 102 98 - 111 mmol/L   CO2 17 (L) 22 - 32 mmol/L   Glucose, Bld 170 (H) 70 - 99 mg/dL    Comment: Glucose reference range applies only to samples taken after fasting for at least 8 hours.   BUN 19 8 - 23 mg/dL   Creatinine, Ser 6.04 (H) 0.44 - 1.00 mg/dL   Calcium 7.7 (L) 8.9 - 10.3 mg/dL   GFR, Estimated 21 (L) >60 mL/min    Comment: (NOTE) Calculated using the CKD-EPI Creatinine  Equation (2021)    Anion gap 12 5 - 15    Comment: Performed at [facility], [address] [place], [place] [address]  CBC     Status: Abnormal   Collection Time: 04/05/23  7:00 AM  Result Value Ref Range   WBC 9.5 4.0 - 10.5 K/uL   RBC 3.01 (L) 3.87 - 5.11 MIL/uL   Hemoglobin 8.6 (L) 12.0 - 15.0 g/dL   HCT 11.9 (L) 14.7 - 82.9 %   MCV 91.4 80.0 - 100.0 fL   MCH 28.6 26.0 - 34.0 pg   MCHC 31.3 30.0 - 36.0 g/dL   RDW 56.2 13.0 - 86.5 %   Platelets 236 150 - 400 K/uL   nRBC 0.0 0.0 - 0.2 %    Comment: Performed at [facility], [address], [place], [place] [address]  Magnesium     Status: None   Collection Time:  04/05/23  7:00 AM  Result Value Ref Range   Magnesium 1.7 1.7 - 2.4 mg/dL    Comment: Performed at [facility], [address], [place], [place] [address]    Imaging / Studies: No results found.  Medications / Allergies: per chart  Antibiotics: Anti-infectives (From admission, onward)    Start     Dose/Rate Route Frequency Ordered Stop   04/04/23 2200  ceFAZolin (ANCEF) IVPB 2g/100 mL premix        2 g 200 mL/hr over 30 Minutes Intravenous Every 8 hours 04/04/23 1815 04/05/23 0627   04/04/23 0915  vancomycin (VANCOREADY) IVPB 1500 mg/300 mL        1,500 mg 150 mL/hr over 120 Minutes Intravenous On call to O.R. 04/04/23 0901 04/04/23 1151   04/04/23 0915  gentamicin (GARAMYCIN) 430 mg in dextrose 5 % 50 mL IVPB       Note to Pharmacy: Pharmacy may adjust dosing strength, schedule, rate of infusion, etc as needed to optimize therapy Send with patient on call to the OR.  Anesthesia to complete antibiotic administration <68min prior to incision per [facility].   5 mg/kg  86.4 kg (Adjusted) 121.5 mL/hr over 30 Minutes Intravenous On call to O.R. 04/04/23 0901 04/04/23 1203         Note: Portions of this report may have been transcribed using voice recognition software. Every effort was made to  ensure accuracy; however, inadvertent computerized transcription errors may be present.   Any transcriptional errors that result from this process are unintentional.    Ardeth Sportsman, MD, FACS, MASCRS Esophageal, Gastrointestinal & Colorectal Surgery Robotic and Minimally Invasive Surgery  [facility] [address] [place], [place] [address] [phone] Fax [phone] Main  CONTACT INFORMATION: Weekday (9AM-5PM): Call [facility] main office at [phone] Weeknight (5PM-9AM) or Weekend/Holiday: Check EPIC "Web Links" tab & use "AMION" (password " [email or web address]") for General Surgery [facility] coverage  Please, DO NOT use SecureChat  (it is not reliable communication to reach operating surgeons & will lead to a delay in care).   Epic staff messaging available for outptient concerns needing 1-2 business day response.      04/05/2023  9:23 AM

## 2023-04-06 ENCOUNTER — Inpatient Hospital Stay (HOSPITAL_COMMUNITY): Payer: Medicare Other

## 2023-04-06 ENCOUNTER — Encounter (HOSPITAL_COMMUNITY): Payer: Medicare Other

## 2023-04-06 DIAGNOSIS — N189 Chronic kidney disease, unspecified: Secondary | ICD-10-CM | POA: Diagnosis not present

## 2023-04-06 LAB — CREATININE, SERUM
Creatinine, Ser: 3.52 mg/dL — ABNORMAL HIGH (ref 0.44–1.00)
GFR, Estimated: 14 mL/min — ABNORMAL LOW (ref >60)

## 2023-04-06 LAB — CBC
HCT: 26.4 % — ABNORMAL LOW (ref 36.0–46.0)
Hemoglobin: 7.8 g/dL — ABNORMAL LOW (ref 12.0–15.0)
MCH: 29 pg (ref 26.0–34.0)
MCHC: 29.5 g/dL — ABNORMAL LOW (ref 30.0–36.0)
MCV: 98.1 fL (ref 80.0–100.0)
Platelets: 131 10*3/uL — ABNORMAL LOW (ref 150–400)
RBC: 2.69 MIL/uL — ABNORMAL LOW (ref 3.87–5.11)
RDW: 14 % (ref 11.5–15.5)
WBC: 10 10*3/uL (ref 4.0–10.5)
nRBC: 0 % (ref 0.0–0.2)

## 2023-04-06 LAB — ABO/RH: ABO/RH(D): O POS

## 2023-04-06 LAB — POTASSIUM: Potassium: 4.4 mmol/L (ref 3.5–5.1)

## 2023-04-06 LAB — PREPARE RBC (CROSSMATCH)

## 2023-04-06 MED ORDER — SODIUM CHLORIDE 0.9 % IV SOLN: INTRAVENOUS | Status: DC

## 2023-04-06 MED ORDER — ALBUMIN HUMAN 5 % IV SOLN
12.5000 g | Freq: Once | INTRAVENOUS | Status: AC
  Administered 2023-04-06: 12.5 g via INTRAVENOUS
  Filled 2023-04-06: qty 250

## 2023-04-06 MED ORDER — SODIUM CHLORIDE 0.9% IV SOLUTION: Freq: Once | INTRAVENOUS | Status: AC

## 2023-04-06 NOTE — Plan of Care (Signed)
°  Problem: Health Behavior/Discharge Planning: °Goal: Ability to manage health-related needs will improve °Outcome: Progressing °  °Problem: Clinical Measurements: °Goal: Will remain free from infection °Outcome: Progressing °Goal: Diagnostic test results will improve °Outcome: Progressing °  °Problem: Coping: °Goal: Level of anxiety will decrease °Outcome: Progressing °  °

## 2023-04-06 NOTE — Progress Notes (Signed)
Patient had Urine Output of 100 for this shift. Patient had orders for PRN boluses for UOP< 250 mL in 8 hours. On assessment, patient appeared [facility] laying flat, couldn't tolerate flat position . Legs appear edematous. Paged on -call MD Dr. Carolynne Edouard, received an order for albumin 5% and chest X-ray.

## 2023-04-06 NOTE — Progress Notes (Addendum)
2 Days Post-Op   Subjective/Chief Complaint: Complains of feeling jittery. She attributes this to the gabapentin   Objective: Vital signs in last 24 hours: Temp:  [97.5 F (36.4 C)-98.1 F (36.7 C)] 97.8 F (36.6 C) (08/17 0534) Pulse Rate:  [75-103] 96 (08/17 0625) Resp:  [16-18] 16 (08/17 0534) BP: (88-116)/(27-57) 116/57 (08/17 0625) SpO2:  [92 %-94 %] 93 % (08/17 0534) Last BM Date : 04/03/23  Intake/Output from previous day: 08/16 0701 - 08/17 0700 In: 1761.8 [P.O.:600; I.V.:1161.8] Out: 275 [Urine:275] Intake/Output this shift: No intake/output data recorded.  General appearance: alert and cooperative Resp: clear to auscultation bilaterally Cardio: regular rate and rhythm GI: soft, mild tenderness. Incisions look good  Lab Results:  Recent Labs    04/05/23 0700 04/06/23 0444  WBC 9.5 10.0  HGB 8.6* 7.8*  HCT 27.5* 26.4*  PLT 236 131*   BMET Recent Labs    04/05/23 0700 04/06/23 0444  NA 131*  --   K 4.5 4.4  CL 102  --   CO2 17*  --   GLUCOSE 170*  --   BUN 19  --   CREATININE 2.44* 3.52*  CALCIUM 7.7*  --    PT/INR No results for input(s): "LABPROT", "INR" in the last 72 hours. ABG No results for input(s): "PHART", "HCO3" in the last 72 hours.  Invalid input(s): "PCO2", "PO2"  Studies/Results: DG Chest 1 View  Result Date: 04/06/2023 CLINICAL DATA:  Fluid overload.  Shortness of breath, hypertension. EXAM: CHEST  1 VIEW COMPARISON:  11/02/2004. FINDINGS: The heart size and mediastinal contours are within normal limits. Lung volumes are low. Strandy opacities are present at the lung bases. No definite effusion or pneumothorax. The visualized skeletal structures are unremarkable. IMPRESSION: Strandy opacities at the lung bases, possible atelectasis or infiltrate. Electronically Signed   By: Thornell Sartorius M.D.   On: 04/06/2023 02:48    Anti-infectives: Anti-infectives (From admission, onward)    Start     Dose/Rate Route Frequency Ordered Stop    04/04/23 2200  ceFAZolin (ANCEF) IVPB 2g/100 mL premix        2 g 200 mL/hr over 30 Minutes Intravenous Every 8 hours 04/04/23 1815 04/05/23 0627   04/04/23 0915  vancomycin (VANCOREADY) IVPB 1500 mg/300 mL        1,500 mg 150 mL/hr over 120 Minutes Intravenous On call to O.R. 04/04/23 0901 04/04/23 1151   04/04/23 0915  gentamicin (GARAMYCIN) 430 mg in dextrose 5 % 50 mL IVPB       Note to Pharmacy: Pharmacy may adjust dosing strength, schedule, rate of infusion, etc as needed to optimize therapy Send with patient on call to the OR.  Anesthesia to complete antibiotic administration <59min prior to incision per [facility].   5 mg/kg  86.4 kg (Adjusted) 121.5 mL/hr over 30 Minutes Intravenous On call to O.R. 04/04/23 0901 04/04/23 1203       Assessment/Plan: s/p Procedure(s): LAPAROSCOPIC VENTRAL WALL HERNIA WITH LYSIS OF ADHESIONS AND PANNICULECTOMY (N/A) Npo for renal u/s  Renal insufficiency. Cr up again today. Will restart maintenance IVF Hg 7.8 and symptomatic. Will transfuse 2 units pRBC Lovenox for dvt prophylaxis If kidney function does not improve with fluids and blood then will consult nephrology No fluid overload on cxr  LOS: 2 days    Chevis Pretty III 04/06/2023

## 2023-04-06 NOTE — Progress Notes (Signed)
Renal artery duplex has been completed.  Exam somewhat limited due to habitus, discomfort, and recent abdominal hernia surgery.    Results can be found under chart review under CV PROC. 04/06/2023 6:24 PM Makyle Eslick RVT, RDMS

## 2023-04-06 NOTE — Plan of Care (Signed)

## 2023-04-07 LAB — CBC
HCT: 29 % — ABNORMAL LOW (ref 36.0–46.0)
Hemoglobin: 9.1 g/dL — ABNORMAL LOW (ref 12.0–15.0)
MCH: 28.7 pg (ref 26.0–34.0)
MCHC: 31.4 g/dL (ref 30.0–36.0)
MCV: 91.5 fL (ref 80.0–100.0)
Platelets: 198 10*3/uL (ref 150–400)
RBC: 3.17 MIL/uL — ABNORMAL LOW (ref 3.87–5.11)
RDW: 14.5 % (ref 11.5–15.5)
WBC: 8.1 10*3/uL (ref 4.0–10.5)
nRBC: 0 % (ref 0.0–0.2)

## 2023-04-07 LAB — BASIC METABOLIC PANEL
Anion gap: 9 (ref 5–15)
BUN: 22 mg/dL (ref 8–23)
CO2: 20 mmol/L — ABNORMAL LOW (ref 22–32)
Calcium: 8.2 mg/dL — ABNORMAL LOW (ref 8.9–10.3)
Chloride: 105 mmol/L (ref 98–111)
Creatinine, Ser: 1.63 mg/dL — ABNORMAL HIGH (ref 0.44–1.00)
GFR, Estimated: 34 mL/min — ABNORMAL LOW (ref >60)
Glucose, Bld: 96 mg/dL (ref 70–99)
Potassium: 4.1 mmol/L (ref 3.5–5.1)
Sodium: 134 mmol/L — ABNORMAL LOW (ref 135–145)

## 2023-04-07 NOTE — Plan of Care (Signed)

## 2023-04-07 NOTE — Plan of Care (Signed)

## 2023-04-07 NOTE — Progress Notes (Signed)
3 Days Post-Op   Subjective/Chief Complaint: Feels much better today after getting blood yesterday   Objective: Vital signs in last 24 hours: Temp:  [97.8 F (36.6 C)-98.6 F (37 C)] 98.3 F (36.8 C) (08/18 0615) Pulse Rate:  [85-106] 85 (08/18 0615) Resp:  [16-20] 20 (08/18 0615) BP: (113-141)/(50-66) 138/59 (08/18 0615) SpO2:  [91 %-96 %] 94 % (08/18 0615) Last BM Date : 04/03/23  Intake/Output from previous day: 08/17 0701 - 08/18 0700 In: 1847.1 [P.O.:380; I.V.:823.9; Blood:643.2] Out: 2305 [Urine:2305] Intake/Output this shift: No intake/output data recorded.  General appearance: alert and cooperative Resp: clear to auscultation bilaterally Cardio: regular rate and rhythm GI: soft, mild tenderness. Vac in place  Lab Results:  Recent Labs    04/06/23 0444 04/07/23 0448  WBC 10.0 8.1  HGB 7.8* 9.1*  HCT 26.4* 29.0*  PLT 131* 198   BMET Recent Labs    04/05/23 0700 04/06/23 0444 04/07/23 0448  NA 131*  --  134*  K 4.5 4.4 4.1  CL 102  --  105  CO2 17*  --  20*  GLUCOSE 170*  --  96  BUN 19  --  22  CREATININE 2.44* 3.52* 1.63*  CALCIUM 7.7*  --  8.2*   PT/INR No results for input(s): "LABPROT", "INR" in the last 72 hours. ABG No results for input(s): "PHART", "HCO3" in the last 72 hours.  Invalid input(s): "PCO2", "PO2"  Studies/Results: VAS US RENAL ARTERY DUPLEX  Result Date: 04/06/2023 ABDOMINAL VISCERAL Patient Name:  Jacqueline Mora  Date of Exam:   04/06/2023 Medical Rec #: [id number]        Accession #:    [id number] Date of Birth: 01/28/1956       Patient Gender: F Patient Age:   67 years Exam Location:  [facility] Procedure:      VAS US RENAL ARTERY DUPLEX Referring Phys: [id number] Chevis Pretty III -------------------------------------------------------------------------------- Indications: "Renal insufficiency" High Risk Factors: Hypertension, hyperlipidemia, past history of smoking. Limitations: Obesity, patient discomfort and air/bowel  gas, recent abdominal hernia surgery. Comparison Study: No previous exams Performing Technologist: Jody Hill RVT, RDMS  Examination Guidelines: A complete evaluation includes B-mode imaging, spectral Doppler, color Doppler, and power Doppler as needed of all accessible portions of each vessel. Bilateral testing is considered an integral part of a complete examination. Limited examinations for reoccurring indications may be performed as noted.  Duplex Findings: +----------+--------+--------+------+--------+ MesentericPSV cm/sEDV cm/sPlaqueComments +----------+--------+--------+------+--------+ Aorta Mid    91      13                  +----------+--------+--------+------+--------+    +------------------+--------+--------+-------+ Right Renal ArteryPSV cm/sEDV cm/sComment +------------------+--------+--------+-------+ Distal              121      20           +------------------+--------+--------+-------+ +-----------------+--------+--------+-------+ Left Renal ArteryPSV cm/sEDV cm/sComment +-----------------+--------+--------+-------+ Proximal           139      29           +-----------------+--------+--------+-------+ Mid                151      22           +-----------------+--------+--------+-------+ Distal              81      16           +-----------------+--------+--------+-------+  Technologist observations: VERY limited study  due to habitus & recent abdominal hernia surgery. +------------+--------+--------+----+-----------+--------+--------+----+ Right KidneyPSV cm/sEDV cm/sRI  Left KidneyPSV cm/sEDV cm/sRI   +------------+--------+--------+----+-----------+--------+--------+----+ Upper Pole  30      5       0.83Upper Pole 29      7       0.77 +------------+--------+--------+----+-----------+--------+--------+----+ Mid         51      10      0.        57      14      0.76  +------------+--------+--------+----+-----------+--------+--------+----+ Lower Pole  24      6       0.75Lower Pole 58      10      0.83 +------------+--------+--------+----+-----------+--------+--------+----+ Hilar       53      10      0.81Hilar      58      10      0.82 +------------+--------+--------+----+-----------+--------+--------+----+ +------------------+---------+------------------+---------+ Right Kidney               Left Kidney                 +------------------+---------+------------------+---------+ RAR                        RAR                         +------------------+---------+------------------+---------+ RAR (manual)      1.33     RAR (manual)      1.66      +------------------+---------+------------------+---------+ Cortex            30/7 0.77Cortex            34/7 0.78 +------------------+---------+------------------+---------+ Cortex thickness           Corex thickness             +------------------+---------+------------------+---------+ Kidney length (cm)10.00    Kidney length (cm)10.10     +------------------+---------+------------------+---------+   Summary: Renal:  Right: No evidence of right renal artery stenosis, however only able        to visualize distal RRA. Abnormal right Resistive Index.        Normal size right kidney. RRV flow present. Left:  No evidence of left renal artery stenosis. Abnormal left        Resisitve Index. Normal size of left kidney. LRV flow        present. Mesenteric:  Celiac artery and SMA not visualized on this exam.  *See table(s) above for measurements and observations.     Preliminary    DG Chest 1 View  Result Date: 04/06/2023 CLINICAL DATA:  Fluid overload.  Shortness of breath, hypertension. EXAM: CHEST  1 VIEW COMPARISON:  11/02/2004. FINDINGS: The heart size and mediastinal contours are within normal limits. Lung volumes are low. Strandy opacities are present at the lung bases. No definite  effusion or pneumothorax. The visualized skeletal structures are unremarkable. IMPRESSION: Strandy opacities at the lung bases, possible atelectasis or infiltrate. Electronically Signed   By: Thornell Sartorius M.D.   On: 04/06/2023 02:48    Anti-infectives: Anti-infectives (From admission, onward)    Start     Dose/Rate Route Frequency Ordered Stop   04/04/23 2200  ceFAZolin (ANCEF) IVPB 2g/100 mL premix        2 g 200 mL/hr over 30 Minutes Intravenous Every 8 hours 04/04/23 1815 04/05/23 0627   04/04/23  0915  vancomycin (VANCOREADY) IVPB 1500 mg/300 mL        1,500 mg 150 mL/hr over 120 Minutes Intravenous On call to O.R. 04/04/23 0901 04/04/23 1151   04/04/23 0915  gentamicin (GARAMYCIN) 430 mg in dextrose 5 % 50 mL IVPB       Note to Pharmacy: Pharmacy may adjust dosing strength, schedule, rate of infusion, etc as needed to optimize therapy Send with patient on call to the OR.  Anesthesia to complete antibiotic administration <63min prior to incision per [facility].   5 mg/kg  86.4 kg (Adjusted) 121.5 mL/hr over 30 Minutes Intravenous On call to O.R. 04/04/23 0901 04/04/23 1203       Assessment/Plan: s/p Procedure(s): LAPAROSCOPIC VENTRAL WALL HERNIA WITH LYSIS OF ADHESIONS AND PANNICULECTOMY (N/A) Advance diet Cr down to 1.6. will continue some maintenance IVF Ambulate Renal u/s not performed. Fortunately kidney function improving Lovenox for dvt prophylaxis   LOS: 3 days    Chevis Pretty III 04/07/2023

## 2023-04-07 NOTE — Progress Notes (Signed)
Discussed with Dr. Carolynne Edouard pt's diet status. Orders received.

## 2023-04-07 NOTE — Plan of Care (Signed)
  Problem: Education: Goal: Knowledge of General Education information will improve Description: Including pain rating scale, medication(s)/side effects and non-pharmacologic comfort measures Outcome: Progressing   Problem: Clinical Measurements: Goal: Will remain free from infection Outcome: Progressing   

## 2023-04-08 ENCOUNTER — Encounter (HOSPITAL_COMMUNITY): Payer: Self-pay | Admitting: Surgery

## 2023-04-08 DIAGNOSIS — F419 Anxiety disorder, unspecified: Secondary | ICD-10-CM

## 2023-04-08 DIAGNOSIS — K589 Irritable bowel syndrome without diarrhea: Secondary | ICD-10-CM

## 2023-04-08 DIAGNOSIS — R7303 Prediabetes: Secondary | ICD-10-CM

## 2023-04-08 DIAGNOSIS — K219 Gastro-esophageal reflux disease without esophagitis: Secondary | ICD-10-CM

## 2023-04-08 DIAGNOSIS — E039 Hypothyroidism, unspecified: Secondary | ICD-10-CM | POA: Insufficient documentation

## 2023-04-08 LAB — CBC
HCT: 29.2 % — ABNORMAL LOW (ref 36.0–46.0)
Hemoglobin: 9.1 g/dL — ABNORMAL LOW (ref 12.0–15.0)
MCH: 28.4 pg (ref 26.0–34.0)
MCHC: 31.2 g/dL (ref 30.0–36.0)
MCV: 91.3 fL (ref 80.0–100.0)
Platelets: 249 10*3/uL (ref 150–400)
RBC: 3.2 MIL/uL — ABNORMAL LOW (ref 3.87–5.11)
RDW: 14.7 % (ref 11.5–15.5)
WBC: 8.9 10*3/uL (ref 4.0–10.5)
nRBC: 0 % (ref 0.0–0.2)

## 2023-04-08 LAB — TYPE AND SCREEN
ABO/RH(D): O POS
Antibody Screen: NEGATIVE
Unit division: 0
Unit division: 0

## 2023-04-08 LAB — BPAM RBC
Blood Product Expiration Date: 202409142359
Blood Product Expiration Date: 202409142359
ISSUE DATE / TIME: 202408171119
ISSUE DATE / TIME: 202408171505
Unit Type and Rh: 5100
Unit Type and Rh: 5100

## 2023-04-08 LAB — SURGICAL PATHOLOGY

## 2023-04-08 MED ORDER — GABAPENTIN 100 MG PO CAPS
200.0000 mg | ORAL_CAPSULE | Freq: Three times a day (TID) | ORAL | Status: DC
  Administered 2023-04-08: 200 mg via ORAL
  Filled 2023-04-08 (×3): qty 2

## 2023-04-08 MED ORDER — ENOXAPARIN SODIUM 60 MG/0.6ML IJ SOSY
60.0000 mg | PREFILLED_SYRINGE | INTRAMUSCULAR | Status: DC
  Administered 2023-04-09: 60 mg via SUBCUTANEOUS
  Filled 2023-04-08: qty 0.6

## 2023-04-08 MED ORDER — LACTATED RINGERS IV BOLUS: 1000.0000 mL | Freq: Three times a day (TID) | INTRAVENOUS | Status: DC | PRN

## 2023-04-08 MED ORDER — ALBUTEROL SULFATE (2.5 MG/3ML) 0.083% IN NEBU
2.5000 mg | INHALATION_SOLUTION | Freq: Once | RESPIRATORY_TRACT | Status: AC
  Administered 2023-04-08: 2.5 mg via RESPIRATORY_TRACT
  Filled 2023-04-08: qty 3

## 2023-04-08 MED ORDER — SODIUM CHLORIDE 0.9% FLUSH: 3.0000 mL | INTRAVENOUS | Status: DC | PRN

## 2023-04-08 MED ORDER — SODIUM CHLORIDE 0.9 % IV SOLN: 250.0000 mL | INTRAVENOUS | Status: DC | PRN

## 2023-04-08 MED ORDER — HYDROMORPHONE HCL 1 MG/ML IJ SOLN: 0.5000 mg | INTRAMUSCULAR | Status: DC | PRN

## 2023-04-08 MED ORDER — ALPRAZOLAM 0.5 MG PO TABS
0.5000 mg | ORAL_TABLET | Freq: Three times a day (TID) | ORAL | Status: DC | PRN
  Administered 2023-04-08: 0.5 mg via ORAL
  Filled 2023-04-08: qty 1

## 2023-04-08 MED ORDER — SODIUM CHLORIDE 0.9% FLUSH
3.0000 mL | Freq: Two times a day (BID) | INTRAVENOUS | Status: DC
Start: 2023-04-08 — End: 2023-04-08

## 2023-04-08 MED ORDER — POLYETHYLENE GLYCOL 3350 17 G PO PACK
17.0000 g | PACK | Freq: Two times a day (BID) | ORAL | Status: DC
  Administered 2023-04-08 – 2023-04-09 (×3): 17 g via ORAL
  Filled 2023-04-08 (×3): qty 1

## 2023-04-08 MED ORDER — OXYCODONE HCL 5 MG PO TABS
10.0000 mg | ORAL_TABLET | ORAL | Status: DC | PRN
  Administered 2023-04-08 (×2): 10 mg via ORAL
  Administered 2023-04-09 (×2): 15 mg via ORAL
  Filled 2023-04-08: qty 3
  Filled 2023-04-08 (×2): qty 2
  Filled 2023-04-08: qty 3

## 2023-04-08 MED ORDER — TAB-A-VITE/IRON PO TABS
1.0000 | ORAL_TABLET | Freq: Every day | ORAL | Status: DC
  Administered 2023-04-08 – 2023-04-09 (×2): 1 via ORAL
  Filled 2023-04-08 (×2): qty 1

## 2023-04-08 NOTE — Evaluation (Signed)
Physical Therapy Evaluation Patient Details Name: Jacqueline Mora MRN: [id number] DOB: Jul 20, 1956 Today's Date: 04/08/2023  History of Present Illness  67 yo female s/pLAPAROSCOPIC VENTRAL WALL HERNIA WITH LYSIS OF ADHESIONS AND PANNICULECTOMY on 04/04/23. PMH: HTN, covid, anxiety, achilles tendon tear repair  Clinical Impression  Pt admitted with above diagnosis.  Pt agreeable to PT, amb ~ 200' with RW and CGA; pt did amb earlier with nursing staff; encouraged pt to continue to mobilize; has DME, will likely not need f/u PT; pt reviewed exercises she is doing for plantar fasciitis. Will follow in acute setting  Pt currently with functional limitations due to the deficits listed below (see PT Problem List). Pt will benefit from acute skilled PT to increase their independence and safety with mobility to allow discharge.           If plan is discharge home, recommend the following: Assist for transportation;Help with stairs or ramp for entrance;Assistance with cooking/housework   Can travel by private vehicle        Equipment Recommendations None recommended by PT  Recommendations for Other Services       Functional Status Assessment Patient has had a recent decline in their functional status and demonstrates the ability to make significant improvements in function in a reasonable and predictable amount of time.     Precautions / Restrictions Precautions Precautions: Fall Precaution Comments: abd incision, VAC, abd binder Restrictions Weight Bearing Restrictions: No      Mobility  Bed Mobility               General bed mobility comments: reviewed log roll technique-pt received in recliner    Transfers Overall transfer level: Needs assistance Equipment used: Rolling walker (2 wheels) Transfers: Sit to/from Stand Sit to Stand: Contact guard assist           General transfer comment: cues to power up and control descent    Ambulation/Gait Ambulation/Gait  assistance: Contact guard assist Gait Distance (Feet): 200 Feet Assistive device: Rolling walker (2 wheels) Gait Pattern/deviations: Step-through pattern, Decreased stride length, Decreased dorsiflexion - right, Decreased dorsiflexion - left       General Gait Details: cues for trunk extension as able, CGA for safety  Stairs            Wheelchair Mobility     Tilt Bed    Modified Rankin (Stroke Patients Only)       Balance Overall balance assessment: Needs assistance Sitting-balance support: Feet supported, No upper extremity supported Sitting balance-Leahy Scale: Fair     Standing balance support: During functional activity, Reliant on assistive device for balance Standing balance-Leahy Scale: Fair                               Pertinent Vitals/Pain Pain Assessment Pain Assessment: Faces Faces Pain Scale: Hurts little more Pain Location: abd Pain Descriptors / Indicators: Grimacing Pain Intervention(s): Limited activity within patient's tolerance, Monitored during session, Premedicated before session, Repositioned    Home Living Family/patient expects to be discharged to:: Private residence Living Arrangements: Alone   Type of Home: House         Home Layout: One level;Bed/bath upstairs Home Equipment: Agricultural consultant (2 wheels);Wheelchair - manual;BSC/3in1;Cane - single point      Prior Function Prior Level of Function : Independent/Modified Independent                     Extremity/Trunk  Assessment   Upper Extremity Assessment Upper Extremity Assessment: Overall WFL for tasks assessed    Lower Extremity Assessment Lower Extremity Assessment: Overall WFL for tasks assessed       Communication   Communication Communication: No apparent difficulties  Cognition Arousal: Alert Behavior During Therapy: WFL for tasks assessed/performed Overall Cognitive Status: Within Functional Limits for tasks assessed                                           General Comments      Exercises     Assessment/Plan    PT Assessment Patient needs continued PT services  PT Problem List Decreased activity tolerance;Decreased mobility;Pain       PT Treatment Interventions DME instruction;Gait training;Stair training;Functional mobility training;Therapeutic activities;Therapeutic exercise    PT Goals (Current goals can be found in the Care Plan section)  Acute Rehab PT Goals Patient Stated Goal: bea able to go upstairs to bedroom PT Goal Formulation: With patient Time For Goal Achievement: 04/15/23 Potential to Achieve Goals: Good    Frequency Min 1X/week     Co-evaluation               AM-PAC PT "6 Clicks" Mobility  Outcome Measure Help needed turning from your back to your side while in a flat bed without using bedrails?: A Little Help needed moving from lying on your back to sitting on the side of a flat bed without using bedrails?: A Little Help needed moving to and from a bed to a chair (including a wheelchair)?: A Little Help needed standing up from a chair using your arms (e.g., wheelchair or bedside chair)?: A Little Help needed to walk in hospital room?: A Little Help needed climbing 3-5 steps with a railing? : A Lot 6 Click Score: 17    End of Session Equipment Utilized During Treatment: Gait belt Activity Tolerance: Patient tolerated treatment well Patient left: in chair;with call bell/phone within reach   PT Visit Diagnosis: Other abnormalities of gait and mobility (R26.89)    Time: 1610-9604 PT Time Calculation (min) (ACUTE ONLY): 24 min   Charges:   PT Evaluation $PT Eval Low Complexity: 1 Low PT Treatments $Gait Training: 8-22 mins PT General Charges $$ ACUTE PT VISIT: 1 Visit         Delice Bison, PT  Acute Rehab Dept [facility]) [phone]  04/08/2023   [facility] 04/08/2023, 2:55 PM

## 2023-04-08 NOTE — Progress Notes (Signed)
Verbal order by MD Carolynne Edouard for albuterol nebulizer treatment and CBC.

## 2023-04-08 NOTE — Progress Notes (Signed)
04/08/2023  Jacqueline Mora [id number] 30-Jul-1956  CARE TEAM: PCP: Jarrett Soho, PA-C  Outpatient Care Team: Patient Care Team: Jarrett Soho, PA-C as PCP - General (Family Medicine) Christell Constant, MD as PCP - Cardiology (Cardiology) Karie Soda, MD as Consulting Physician (General Surgery)  Inpatient Treatment Team: Treatment Team:  Karie Soda, MD Dina Rich, RN Misty Stanley, NT Tommie Raymond, RN   Problem List:   Principal Problem:   Recurrent incisional hernia Active Problems:   Adjustment insomnia   DOE (dyspnea on exertion)   Morbid obesity (HCC)   Essential hypertension   Hyperlipidemia   Constipation   04/04/2023  POST-OPERATIVE DIAGNOSIS:   VENTRAL INCISIONAL REOCRRUING AND INCARCERATED HERNIA  Dimensions of hernia post-op:  18cm x 11cm PANNICULUS WITH HISTORY OF MESH INFECTION & GIANT HERNIA SAC   PROCEDURE:   LAPAROSCOPIC REPAIR OF ABDOMINAL HERNIA WITH MESH  (See OR Findings below) PANNICULECTOMY TAP BLOCK - BILATERAL   SURGEON:  Ardeth Sportsman, MD  OR FINDINGS:  Swiss cheese incisional hernia primarily super and periumbilical incarcerated with most of the greater omentum as well as mid transverse colon.  18 x 11 cm region total.  Old spiral metal tacks and sutures noted from prior mesh repair.  Those were excised.  No remaining mesh.   Hernia location: Periumbilical   Hernia type: Recurrent incisional - Incarcerated   Hernia size - largest dimension 18cm x 11cm   Type of repair: Laparoscopic underlay repair.  Primary repair of largest hernia              Placement of mesh: Centrally intraperitoneal with edges tucked into RECTRORECTUS & preperitoneal space   Name of mesh: Bard Ventralight dual sided (polypropylene / Seprafilm)   Size of mesh: 33x27cm   Orientation: Transverse   Mesh overlap:  5-7cm  Assessment [facility] Stay = 4 days) 4 Days Post-Op    Gradually  improving   Plan:  -ERAS protocol  Solid diet  Bowel regimen.  -Multimodal pain control.  Continue scheduled Tylenol.  Add gabapentin.  Methocarbamol/oxycodone for pain.  Increase oxycodone dose since it does not seem to be adequate.  Try and expand interval of hydromorphone out so she is less dependent on it  -Elevated creatinine appears to be normalizing and nonoliguric.  DC Foley.  Follow creatinine.  Follow-up IV fluids   Follow hemoglobin status post IV iron and transfusion for acute blood loss anemia incentive chronic anemia.    -mobilize as tolerated to help recovery.  See physical therapy can help evaluate to see if she needs extra help her.  OT eval for equipment needs etc.  Anxiolysis  Follow-up control hypertension  Hypothyroidism back on home levothyroxine  Hyperlipidemia controlled and follow   -Disposition:  Disposition:  The patient is from: Home Anticipate discharge to:  Home Anticipated Date of Discharge is:  August 19,2024   Barriers to discharge:  Pending Clinical improvement (more likely than not)  Patient currently is NOT MEDICALLY STABLE for discharge from the hospital from a surgery standpoint.      I reviewed nursing notes, last 24 h vitals and pain scores, last 48 h intake and output, last 24 h labs and trends, and last 24 h imaging results.  I have reviewed this patient's available data, including medical history, events of note, test results, etc as part of my evaluation.   A significant portion of that time was spent in counseling. Care during the described time interval was provided by  me.  This care required moderate level of medical decision making.  04/08/2023    Subjective: (Chief complaint)  Patient up in chair.  Still feeling rather sore specially getting up.  Afraid she is going to pull something.  Having a lot of flatus but no bowel movements yet.  Much less nausea.  Objective:  Vital signs:  Vitals:   04/07/23  1327 04/07/23 1945 04/07/23 2113 04/08/23 0507  BP: 133/61 (!) 163/89 (!) 154/78 (!) 157/87  Pulse: 86 75 81 74  Resp: 18 17  17   Temp: (!) 97.5 F (36.4 C) 97.8 F (36.6 C)  (!) 97.4 F (36.3 C)  TempSrc: Oral Oral  Oral  SpO2: 93% 98%  98%    Last BM Date : 04/03/23  Intake/Output   Yesterday:  08/18 0701 - 08/19 0700 In: 1772.6 [P.O.:300; I.V.:1472.6] Out: 1050 [Urine:1050] This shift:  No intake/output data recorded.  Bowel function:  Flatus: YES  BM:  No  Drain: (No drain)   Physical Exam:  General: Pt awake/alert in no acute distress.  Less pale more alert.  Mildly sore but not toxic nor sickly. Eyes: PERRL, normal EOM.  Sclera clear.  No icterus Neuro: CN II-XII intact w/o focal sensory/motor deficits. Lymph: No head/neck/groin lymphadenopathy Psych:  No delerium/psychosis/paranoia.  Oriented x 4 HENT: Normocephalic, Mucus membranes moist.  No thrush Neck: Supple, No tracheal deviation.  No obvious thyromegaly Chest: No pain to chest wall compression.  Good respiratory excursion.  No audible wheezing CV:  Pulses intact.  Regular rhythm.  No major extremity edema MS: Normal AROM mjr joints.  No obvious deformity  Abdomen: Soft.  Nondistended.  Mildly tender at incisions only.  No evidence of peritonitis.  No incarcerated hernias. Mild ecchymosis.  Minimal drainage at suprapubic puncture sites and Steri-Strips Prevena incisional wound VAC in place with minimal serosanguineous output  Ext:   No deformity.  No mjr edema.  No cyanosis Skin: No petechiae / purpurea.  No major sores.  Warm and dry    Results:   Cultures: No results found for this or any previous visit (from the past 720 hour(s)).  Labs: Results for orders placed or performed during the hospital encounter of 04/04/23 (from the past 48 hour(s))  CBC     Status: Abnormal   Collection Time: 04/07/23  4:48 AM  Result Value Ref Range   WBC 8.1 4.0 - 10.5 K/uL   RBC 3.17 (L) 3.87 - 5.11 MIL/uL    Hemoglobin 9.1 (L) 12.0 - 15.0 g/dL   HCT 78.2 (L) 95.6 - 21.3 %   MCV 91.5 80.0 - 100.0 fL   MCH 28.7 26.0 - 34.0 pg   MCHC 31.4 30.0 - 36.0 g/dL   RDW 08.6 57.8 - 46.9 %   Platelets 198 150 - 400 K/uL   nRBC 0.0 0.0 - 0.2 %    Comment: Performed at [facility], [address], [place], [place] [address]  Basic metabolic panel     Status: Abnormal   Collection Time: 04/07/23  4:48 AM  Result Value Ref Range   Sodium 134 (L) 135 - 145 mmol/L   Potassium 4.1 3.5 - 5.1 mmol/L   Chloride 105 98 - 111 mmol/L   CO2 20 (L) 22 - 32 mmol/L   Glucose, Bld 96 70 - 99 mg/dL    Comment: Glucose reference range applies only to samples taken after fasting for at least 8 hours.   BUN 22 8 - 23 mg/dL  Creatinine, Ser 1.63 (H) 0.44 - 1.00 mg/dL    Comment: DELTA CHECK NOTED   Calcium 8.2 (L) 8.9 - 10.3 mg/dL   GFR, Estimated 34 (L) >60 mL/min    Comment: (NOTE) Calculated using the CKD-EPI Creatinine Equation (2021)    Anion gap 9 5 - 15    Comment: Performed at [facility], [address] [place], [place] [address]  CBC     Status: Abnormal   Collection Time: 04/08/23  5:54 AM  Result Value Ref Range   WBC 8.9 4.0 - 10.5 K/uL   RBC 3.20 (L) 3.87 - 5.11 MIL/uL   Hemoglobin 9.1 (L) 12.0 - 15.0 g/dL   HCT 57.8 (L) 46.9 - 62.9 %   MCV 91.3 80.0 - 100.0 fL   MCH 28.4 26.0 - 34.0 pg   MCHC 31.2 30.0 - 36.0 g/dL   RDW 52.8 41.3 - 24.4 %   Platelets 249 150 - 400 K/uL   nRBC 0.0 0.0 - 0.2 %    Comment: Performed at [facility], [address] [place], [place] [address]    Imaging / Studies: VAS US RENAL ARTERY DUPLEX  Result Date: 04/07/2023 ABDOMINAL VISCERAL Patient Name:  STELA DUNSFORD  Date of Exam:   04/06/2023 Medical Rec #: [id number]        Accession #:    [id number] Date of Birth: 1956-05-15       Patient Gender: F Patient Age:   67 years Exam Location:  [facility] Procedure:      VAS US RENAL ARTERY DUPLEX  Referring Phys: [id number] Chevis Pretty III -------------------------------------------------------------------------------- Indications: "Renal insufficiency" High Risk Factors: Hypertension, hyperlipidemia, past history of smoking. Limitations: Obesity, patient discomfort and air/bowel gas, recent abdominal hernia surgery. Comparison Study: No previous exams Performing Technologist: Jody Hill RVT, RDMS  Examination Guidelines: A complete evaluation includes B-mode imaging, spectral Doppler, color Doppler, and power Doppler as needed of all accessible portions of each vessel. Bilateral testing is considered an integral part of a complete examination. Limited examinations for reoccurring indications may be performed as noted.  Duplex Findings: +----------+--------+--------+------+--------+ MesentericPSV cm/sEDV cm/sPlaqueComments +----------+--------+--------+------+--------+ Aorta Mid    91      13                  +----------+--------+--------+------+--------+    +------------------+--------+--------+-------+ Right Renal ArteryPSV cm/sEDV cm/sComment +------------------+--------+--------+-------+ Distal              121      20           +------------------+--------+--------+-------+ +-----------------+--------+--------+-------+ Left Renal ArteryPSV cm/sEDV cm/sComment +-----------------+--------+--------+-------+ Proximal           139      29           +-----------------+--------+--------+-------+ Mid                151      22           +-----------------+--------+--------+-------+ Distal              81      16           +-----------------+--------+--------+-------+  Technologist observations: VERY limited study due to habitus & recent abdominal hernia surgery. +------------+--------+--------+----+-----------+--------+--------+----+ Right KidneyPSV cm/sEDV cm/sRI  Left KidneyPSV cm/sEDV cm/sRI   +------------+--------+--------+----+-----------+--------+--------+----+  Upper Pole  30      5       0.83Upper Pole 29      7  0.77 +------------+--------+--------+----+-----------+--------+--------+----+ Mid         51      10      0.        57      14      0.76 +------------+--------+--------+----+-----------+--------+--------+----+ Lower Pole  24      6       0.75Lower Pole 58      10      0.83 +------------+--------+--------+----+-----------+--------+--------+----+ Hilar       53      10      0.81Hilar      58      10      0.82 +------------+--------+--------+----+-----------+--------+--------+----+ +------------------+---------+------------------+---------+ Right Kidney               Left Kidney                 +------------------+---------+------------------+---------+ RAR                        RAR                         +------------------+---------+------------------+---------+ RAR (manual)      1.33     RAR (manual)      1.66      +------------------+---------+------------------+---------+ Cortex            30/7 0.77Cortex            34/7 0.78 +------------------+---------+------------------+---------+ Cortex thickness           Corex thickness             +------------------+---------+------------------+---------+ Kidney length (cm)10.00    Kidney length (cm)10.10     +------------------+---------+------------------+---------+  Summary: Renal:  Right: No evidence of right renal artery stenosis, however only able        to visualize distal RRA. Abnormal right Resistive Index.        Normal size right kidney. RRV flow present. Left:  No evidence of left renal artery stenosis. Abnormal left        Resisitve Index. Normal size of left kidney. LRV flow        present. Mesenteric:  Celiac artery and SMA not visualized on this exam.  *See table(s) above for measurements and observations.  Diagnosing physician: Lemar Livings MD  Electronically signed by Lemar Livings MD on 04/07/2023 at 1:50:39 PM.    Final      Medications / Allergies: per chart  Antibiotics: Anti-infectives (From admission, onward)    Start     Dose/Rate Route Frequency Ordered Stop   04/04/23 2200  ceFAZolin (ANCEF) IVPB 2g/100 mL premix        2 g 200 mL/hr over 30 Minutes Intravenous Every 8 hours 04/04/23 1815 04/05/23 0627   04/04/23 0915  vancomycin (VANCOREADY) IVPB 1500 mg/300 mL        1,500 mg 150 mL/hr over 120 Minutes Intravenous On call to O.R. 04/04/23 0901 04/04/23 1151   04/04/23 0915  gentamicin (GARAMYCIN) 430 mg in dextrose 5 % 50 mL IVPB       Note to Pharmacy: Pharmacy may adjust dosing strength, schedule, rate of infusion, etc as needed to optimize therapy Send with patient on call to the OR.  Anesthesia to complete antibiotic administration <24min prior to incision per [facility].   5 mg/kg  86.4 kg (Adjusted) 121.5 mL/hr over 30 Minutes Intravenous On call to O.R. 04/04/23 0901 04/04/23 1203  Note: Portions of this report may have been transcribed using voice recognition software. Every effort was made to ensure accuracy; however, inadvertent computerized transcription errors may be present.   Any transcriptional errors that result from this process are unintentional.    Ardeth Sportsman, MD, FACS, MASCRS Esophageal, Gastrointestinal & Colorectal Surgery Robotic and Minimally Invasive Surgery  [facility] [address] [place], [place] [address] [phone] Fax [phone] Main  CONTACT INFORMATION: Weekday (9AM-5PM): Call [facility] main office at [phone] Weeknight (5PM-9AM) or Weekend/Holiday: Check EPIC "Web Links" tab & use "AMION" (password " [email or web address]") for General Surgery [facility] coverage  Please, DO NOT use SecureChat  (it is not reliable communication to reach operating surgeons & will lead to a delay in care).   Epic staff messaging available for outptient concerns needing 1-2 business day response.       04/08/2023  8:24 AM

## 2023-04-08 NOTE — Progress Notes (Signed)
MD Carolynne Edouard notified that patient feels SOB and is wheezing.

## 2023-04-09 ENCOUNTER — Ambulatory Visit: Payer: Self-pay | Admitting: *Deleted

## 2023-04-09 DIAGNOSIS — D62 Acute posthemorrhagic anemia: Secondary | ICD-10-CM | POA: Insufficient documentation

## 2023-04-09 LAB — CBC
HCT: 29.6 % — ABNORMAL LOW (ref 36.0–46.0)
Hemoglobin: 9.4 g/dL — ABNORMAL LOW (ref 12.0–15.0)
MCH: 29.2 pg (ref 26.0–34.0)
MCHC: 31.8 g/dL (ref 30.0–36.0)
MCV: 91.9 fL (ref 80.0–100.0)
Platelets: 271 10*3/uL (ref 150–400)
RBC: 3.22 MIL/uL — ABNORMAL LOW (ref 3.87–5.11)
RDW: 14.7 % (ref 11.5–15.5)
WBC: 7.1 10*3/uL (ref 4.0–10.5)
nRBC: 0 % (ref 0.0–0.2)

## 2023-04-09 LAB — CREATININE, SERUM
Creatinine, Ser: 0.83 mg/dL (ref 0.44–1.00)
GFR, Estimated: >60 mL/min (ref >60)

## 2023-04-09 LAB — POTASSIUM: Potassium: 3.9 mmol/L (ref 3.5–5.1)

## 2023-04-09 LAB — MAGNESIUM: Magnesium: 2.2 mg/dL (ref 1.7–2.4)

## 2023-04-09 MED ORDER — METHOCARBAMOL 500 MG PO TABS: 500.0000 mg | ORAL_TABLET | Freq: Three times a day (TID) | ORAL | 2 refills | Status: DC | PRN

## 2023-04-09 MED ORDER — OXYCODONE HCL 10 MG PO TABS: 10.0000 mg | ORAL_TABLET | Freq: Four times a day (QID) | ORAL | 0 refills | Status: DC | PRN

## 2023-04-09 NOTE — Progress Notes (Signed)
Mobility Specialist - Progress Note   04/09/23 0856  Mobility  Activity Ambulated with assistance in hallway  Level of Assistance Standby assist, set-up cues, supervision of patient - no hands on  Assistive Device Front wheel walker  Distance Ambulated (ft) 250 ft  Activity Response Tolerated well  Mobility Referral Yes  $Mobility charge 1 Mobility  Mobility Specialist Start Time (ACUTE ONLY) [id number]  Mobility Specialist Stop Time (ACUTE ONLY) 0855  Mobility Specialist Time Calculation (min) (ACUTE ONLY) 13 min   Pt received in recliner and agreeable to mobility. No complaints during session. Pt to bathroom after session with all needs met. Instructed pt to pull call bell when finished.    [facility]

## 2023-04-09 NOTE — Discharge Summary (Signed)
Physician Discharge Summary    Jacqueline Mora MRN: [id number] DOB/AGE: [birth year] = 67 y.o.  Patient Care Team: Jarrett Soho, PA-C as PCP - General (Family Medicine) Christell Constant, MD as PCP - Cardiology (Cardiology) Karie Soda, MD as Consulting Physician (General Surgery)  Admit date: 04/04/2023  Discharge date: 04/09/2023  Hospital Stay = 5 days    Discharge Diagnoses:  Principal Problem:   Recurrent incisional hernia Active Problems:   Adjustment insomnia   DOE (dyspnea on exertion)   Morbid obesity (HCC)   Essential hypertension   Hyperlipidemia   Constipation   Hypothyroidism   Prediabetes   IBS (irritable bowel syndrome)   GERD (gastroesophageal reflux disease)   Anxiety   5 Days Post-Op  04/04/2023  POST-OPERATIVE DIAGNOSIS:   VENTRAL INCISIONAL REOCRRUING AND INCARCERATED HERNIA  Dimensions of hernia post-op:  18cm x 11cm PANNICULUS WITH HISTORY OF MESH INFECTION & GIANT HERNIA SAC   PROCEDURE:   LAPAROSCOPIC REPAIR OF ABDOMINAL HERNIA WITH MESH  (See OR Findings below) PANNICULECTOMY TAP BLOCK - BILATERAL   SURGEON:  Ardeth Sportsman, MD   OR FINDINGS:  Swiss cheese incisional hernia primarily super and periumbilical incarcerated with most of the greater omentum as well as mid transverse colon.  18 x 11 cm region total.  Old spiral metal tacks and sutures noted from prior mesh repair.  Those were excised.  No remaining mesh.   Hernia location: Periumbilical   Hernia type: Recurrent incisional - Incarcerated   Hernia size - largest dimension 18cm x 11cm   Type of repair: Laparoscopic underlay repair.  Primary repair of largest hernia              Placement of mesh: Centrally intraperitoneal with edges tucked into RECTRORECTUS & preperitoneal space   Name of mesh: Bard Ventralight dual sided (polypropylene / Seprafilm)   Size of mesh: 33x27cm   Orientation: Transverse   Mesh overlap:  5-7cm  Consults: Case Management /  Social Work, Physical Therapy, Pharmacy, and Anesthesia  Hospital Course:   The patient underwent the surgery above.  Patient did struggle with bleeding and oozing requiring transfusion and holding of anticoagulation.  Elevated creatinine resolved.  Normalized.  Strong with pain control with but with adjustment began to improve and mobilize.  Postoperatively, the patient gradually mobilized and advanced to a solid diet.  Pain and other symptoms were treated aggressively.    By the time of discharge, the patient was walking well the hallways, eating food, having flatus.  Physical therapy did not see any special equipment needs.  Pain was well-controlled on an oral medications.  Based on meeting discharge criteria and continuing to recover, I felt it was safe for the patient to be discharged from the hospital to further recover with close followup. Postoperative recommendations were discussed in detail.  They are written as well.  Discharged Condition: stable  Discharge Exam: Blood pressure 130/61, pulse 79, temperature 98 F (36.7 C), temperature source Oral, resp. rate 16, height 5\' 6"  (1.676 m), SpO2 95%.  General: Pt awake/alert/oriented x4 in No acute distress Eyes: PERRL, normal EOM.  Sclera clear.  No icterus Neuro: CN II-XII intact w/o focal sensory/motor deficits. Lymph: No head/neck/groin lymphadenopathy Psych:  No delerium/psychosis/paranoia HENT: Normocephalic, Mucus membranes moist.  No thrush Neck: Supple, No tracheal deviation Chest:  No chest wall pain w good excursion CV:  Pulses intact.  Regular rhythm MS: Normal AROM mjr joints.  No obvious deformity Abdomen: Soft.  Nondistended.  Mildly tender at  incisions only.  Incisional wounds look along transverse lower abdomen at panniculectomy site with minimal ecchymosis and no leaking or infection.  No evidence of peritonitis.  No incarcerated hernias. Ext:  SCDs BLE.  No mjr edema.  No cyanosis Skin: No petechiae /  purpura   Disposition:    Follow-up Information     Karie Soda, MD Follow up on 04/23/2023.   Specialties: General Surgery, Colon and Rectal Surgery Why: To follow up after your operation Contact information: [address] [place] [address] [phone]         [facility] [place] Follow up on 04/15/2023.   Specialty: General Surgery Why: Have your Prevena wound VAC sponge removed off your incision and make sure things are healing well. Contact information: [address] [place] [address] [phone]                Discharge disposition: 01-Home or Self Care       Discharge Instructions     Call MD for:   Complete by: As directed    FEVER > 101.5 F  (temperatures < 101.5 F are not significant)   Call MD for:  extreme fatigue   Complete by: As directed    Call MD for:  persistant dizziness or light-headedness   Complete by: As directed    Call MD for:  persistant nausea and vomiting   Complete by: As directed    Call MD for:  redness, tenderness, or signs of infection (pain, swelling, redness, odor or green/yellow discharge around incision site)   Complete by: As directed    Call MD for:  severe uncontrolled pain   Complete by: As directed    Diet - low sodium heart healthy   Complete by: As directed    Start with a bland diet such as soups, liquids, starchy foods, low fat foods, etc. the first few days at home. Gradually advance to a solid, low-fat, high fiber diet by the end of the first week at home.   Add a fiber supplement to your diet (Metamucil, etc) If you feel full, bloated, or constipated, stay on a full liquid or pureed/blenderized diet for a few days until you feel better and are no longer constipated.   Discharge instructions   Complete by: As directed    See Discharge Instructions If you are not getting better after two weeks or are noticing you are getting worse, contact our  office [phone] for further advice.  We may need to adjust your medications, re-evaluate you in the office, send you to the emergency room, or see what other things we can do to help. The clinic staff is available to answer your questions during regular business hours (8:30am-5pm).  Please don't hesitate to call and ask to speak to one of our nurses for clinical concerns.    A surgeon from [facility] is always on call at the hospitals 24 hours/day If you have a medical emergency, go to the nearest emergency room or call 911.   Discharge wound care:   Complete by: As directed    It is good for closed incisions and even open wounds to be washed every day.  Shower every day.  Short baths are fine.  Wash the incisions and wounds clean with soap & water.    You may leave closed incisions open to air if it is dry.   You may cover the incision with clean  gauze & replace it after your daily shower for comfort.  You have a Prevena incisional wound VAC that is protecting your lower abdominal incision at your panniculectomy and hernia repair.  Please come to the office next week, Monday 8/26, to have a nurse remove the incisional wound VAC sponge and check your incisions.   Driving Restrictions   Complete by: As directed    You may drive when: - you are no longer taking narcotic prescription pain medication - you can comfortably wear a seatbelt - you can safely make sudden turns/stops without pain.   Increase activity slowly   Complete by: As directed    Start light daily activities --- self-care, walking, climbing stairs- beginning the day after surgery.  Gradually increase activities as tolerated.  Control your pain to be active.  Stop when you are tired.  Ideally, walk several times a day, eventually an hour a day.   Most people are back to most day-to-day activities in a few weeks.  It takes 4-6 weeks to get back to unrestricted, intense activity. If you can walk 30 minutes without  difficulty, it is safe to try more intense activity such as jogging, treadmill, bicycling, low-impact aerobics, swimming, etc. Save the most intensive and strenuous activity for last (Usually 4-8 weeks after surgery) such as sit-ups, heavy lifting, contact sports, etc.  Refrain from any intense heavy lifting or straining until you are off narcotics for pain control.  You will have off days, but things should improve week-by-week. DO NOT PUSH THROUGH PAIN.  Let pain be your guide: If it hurts to do something, don't do it.   Lifting restrictions   Complete by: As directed    If you can walk 30 minutes without difficulty, it is safe to try more intense activity such as jogging, treadmill, bicycling, low-impact aerobics, swimming, etc. Save the most intensive and strenuous activity for last (Usually 4-8 weeks after surgery) such as sit-ups, heavy lifting, contact sports, etc.   Refrain from any intense heavy lifting or straining until you are off narcotics for pain control.  You will have off days, but things should improve week-by-week. DO NOT PUSH THROUGH PAIN.  Let pain be your guide: If it hurts to do something, don't do it.  Pain is your body warning you to avoid that activity for another week until the pain goes down.   May shower / Bathe   Complete by: As directed    May walk up steps   Complete by: As directed    Sexual Activity Restrictions   Complete by: As directed    You may have sexual intercourse when it is comfortable. If it hurts to do something, stop.       Allergies as of 04/09/2023       Reactions   Amlodipine Besylate Other (See Comments)   Dizziness   Eszopiclone    Other reaction(s): Crazy dreams   Tape    Adhesive on medical tape and glue burns skin - requires paper tape.   Zocor [simvastatin]    myalgias   Clindamycin Hcl Rash   Meloxicam Rash   Methylprednisolone Rash   Penicillin G Rash        Medication List     TAKE these medications    ALPRAZolam  0.5 MG tablet Commonly known as: XANAX Take 0.5 mg by mouth as needed for anxiety.   B-12 PO Take 1 tablet by mouth 4 (four) times a week.   cholecalciferol 1000 units  tablet Commonly known as: VITAMIN D Take 1,000 Units by mouth 4 (four) times a week.   estradiol 1 MG tablet Commonly known as: ESTRACE Take 1 mg by mouth daily.   ezetimibe 10 MG tablet Commonly known as: ZETIA Take 10 mg by mouth daily.   FLUoxetine 10 MG capsule Commonly known as: PROZAC Take 30 mg by mouth daily.   fluticasone 50 MCG/ACT nasal spray Commonly known as: FLONASE Place 1 spray into both nostrils daily as needed for allergies.   furosemide 40 MG tablet Commonly known as: LASIX TAKE 1 TABLET(40 MG TOTAL) BY MOUTH DAILY   ketoconazole 2 % cream Commonly known as: NIZORAL Apply 1 Application topically daily.   levothyroxine 150 MCG tablet Commonly known as: SYNTHROID Take 150 mcg by mouth daily before breakfast.   losartan 100 MG tablet Commonly known as: COZAAR Take 100 mg by mouth daily.   methocarbamol 500 MG tablet Commonly known as: ROBAXIN Take 1-2 tablets (500-1,000 mg total) by mouth every 8 (eight) hours as needed for muscle spasms.   Oxycodone HCl 10 MG Tabs Take 1-1.5 tablets (10-15 mg total) by mouth every 6 (six) hours as needed for severe pain or breakthrough pain.   phentermine 15 MG capsule Take 15 mg by mouth every morning.   RABEprazole 20 MG tablet Commonly known as: ACIPHEX Take 20 mg by mouth daily.   rosuvastatin 10 MG tablet Commonly known as: CRESTOR Take 1 tablet (10 mg total) by mouth daily.   spironolactone 25 MG tablet Commonly known as: ALDACTONE TAKE ONE TABLET BY MOUTH DAILY DOSE INCREASE   Wegovy 0.25 MG/0.5ML Soaj Generic drug: Semaglutide-Weight Management Inject 0.25 mg into the skin once a week.               Discharge Care Instructions  (From admission, onward)           Start     Ordered   04/09/23 0000  Discharge  wound care:       Comments: It is good for closed incisions and even open wounds to be washed every day.  Shower every day.  Short baths are fine.  Wash the incisions and wounds clean with soap & water.    You may leave closed incisions open to air if it is dry.   You may cover the incision with clean gauze & replace it after your daily shower for comfort.  You have a Prevena incisional wound VAC that is protecting your lower abdominal incision at your panniculectomy and hernia repair.  Please come to the office next week, Monday 8/26, to have a nurse remove the incisional wound VAC sponge and check your incisions.   04/09/23 0917            Significant Diagnostic Studies:  Results for orders placed or performed during the hospital encounter of 04/04/23 (from the past 72 hour(s))  CBC     Status: Abnormal   Collection Time: 04/07/23  4:48 AM  Result Value Ref Range   WBC 8.1 4.0 - 10.5 K/uL   RBC 3.17 (L) 3.87 - 5.11 MIL/uL   Hemoglobin 9.1 (L) 12.0 - 15.0 g/dL   HCT 09.6 (L) 04.5 - 40.9 %   MCV 91.5 80.0 - 100.0 fL   MCH 28.7 26.0 - 34.0 pg   MCHC 31.4 30.0 - 36.0 g/dL   RDW 81.1 91.4 - 78.2 %   Platelets 198 150 - 400 K/uL   nRBC 0.0 0.0 - 0.2 %  Comment: Performed at [facility], [address], [place], [place] [address]  Basic metabolic panel     Status: Abnormal   Collection Time: 04/07/23  4:48 AM  Result Value Ref Range   Sodium 134 (L) 135 - 145 mmol/L   Potassium 4.1 3.5 - 5.1 mmol/L   Chloride 105 98 - 111 mmol/L   CO2 20 (L) 22 - 32 mmol/L   Glucose, Bld 96 70 - 99 mg/dL    Comment: Glucose reference range applies only to samples taken after fasting for at least 8 hours.   BUN 22 8 - 23 mg/dL   Creatinine, Ser 1.30 (H) 0.44 - 1.00 mg/dL    Comment: DELTA CHECK NOTED   Calcium 8.2 (L) 8.9 - 10.3 mg/dL   GFR, Estimated 34 (L) >60 mL/min    Comment: (NOTE) Calculated using the CKD-EPI Creatinine Equation (2021)    Anion gap 9 5 - 15     Comment: Performed at [facility], [address] [place], [place] [address]  CBC     Status: Abnormal   Collection Time: 04/08/23  5:54 AM  Result Value Ref Range   WBC 8.9 4.0 - 10.5 K/uL   RBC 3.20 (L) 3.87 - 5.11 MIL/uL   Hemoglobin 9.1 (L) 12.0 - 15.0 g/dL   HCT 46.9 (L) 62.9 - 52.8 %   MCV 91.3 80.0 - 100.0 fL   MCH 28.4 26.0 - 34.0 pg   MCHC 31.2 30.0 - 36.0 g/dL   RDW 41.3 24.4 - 01.0 %   Platelets 249 150 - 400 K/uL   nRBC 0.0 0.0 - 0.2 %    Comment: Performed at [facility], [address], [place], [place] [address]  CBC     Status: Abnormal   Collection Time: 04/09/23  4:45 AM  Result Value Ref Range   WBC 7.1 4.0 - 10.5 K/uL   RBC 3.22 (L) 3.87 - 5.11 MIL/uL   Hemoglobin 9.4 (L) 12.0 - 15.0 g/dL   HCT 66.4 (L) 40.3 - 47.4 %   MCV 91.9 80.0 - 100.0 fL   MCH 29.2 26.0 - 34.0 pg   MCHC 31.8 30.0 - 36.0 g/dL   RDW 25.9 56.3 - 87.5 %   Platelets 271 150 - 400 K/uL   nRBC 0.0 0.0 - 0.2 %    Comment: Performed at [facility], [address], [place], [place] [address]  Potassium     Status: None   Collection Time: 04/09/23  4:45 AM  Result Value Ref Range   Potassium 3.9 3.5 - 5.1 mmol/L    Comment: Performed at [facility], [address], [place], [place] [address]  Creatinine, serum     Status: None   Collection Time: 04/09/23  4:45 AM  Result Value Ref Range   Creatinine, Ser 0.83 0.44 - 1.00 mg/dL   GFR, Estimated >41 >66 mL/min    Comment: (NOTE) Calculated using the CKD-EPI Creatinine Equation (2021) Performed at [facility], [address], [place], [place] [address]   Magnesium     Status: None   Collection Time: 04/09/23  4:45 AM  Result Value Ref Range   Magnesium 2.2 1.7 - 2.4 mg/dL    Comment: Performed at [facility], [address], [place], [place] [address]    No results found.  Past Medical History:  Diagnosis Date    Abdominal hernia    Anemia  Anxiety    BMI 45.0-49.9, adult (HCC)    Cancer (HCC)    Colon cancer (HCC)    COVID-19    Depression    GERD (gastroesophageal reflux disease)    History of colon polyps    Hypercholesteremia    Hypertension    occ   Hypothyroidism    IBS (irritable bowel syndrome)    Mitral valve problem    Petechiae 04/04/2023   red patches after ankle   PONV (postoperative nausea and vomiting)    Prediabetes    Squamous cell carcinoma in situ (SCCIS) of skin    Thyroid disease    Vitamin D deficiency     Past Surgical History:  Procedure Laterality Date   ABDOMINAL HYSTERECTOMY     BLADDER NECK SUSPENSION     CESAREAN SECTION     x 2   colon cancer     COLONOSCOPY     HERNIA REPAIR     Mesh placed then later removed due to infection.   laparostomy     VENTRAL HERNIA REPAIR N/A 04/04/2023   Procedure: LAPAROSCOPIC VENTRAL WALL HERNIA WITH LYSIS OF ADHESIONS AND PANNICULECTOMY;  Surgeon: Karie Soda, MD;  Location: [facility];  Service: General;  Laterality: N/A;    Social History   Socioeconomic History   Marital status: Widowed    Spouse name: Not on file   Number of children: 2   Years of education: 4 yrs college   Highest education level: Not on file  Occupational History   Occupation: Retired  Tobacco Use   Smoking status: Former    Types: Cigarettes   Smokeless tobacco: Never   Tobacco comments:    Quit 1984  Vaping Use   Vaping status: Never Used  Substance and Sexual Activity   Alcohol use: Not Currently    Comment: occasionally   Drug use: No   Sexual activity: Not on file  Other Topics Concern   Not on file  Social History Narrative   Lives at home with husband.   Right-handed.   4-5 cups unsweet tea per week.   Social Determinants of Health   Financial Resource Strain: Not on file  Food Insecurity: No Food Insecurity (04/04/2023)   Hunger Vital Sign    Worried About Running Out of Food in the Last Year: Never true     Ran Out of Food in the Last Year: Never true  Transportation Needs: No Transportation Needs (04/04/2023)   PRAPARE - Administrator, Civil Service (Medical): No    Lack of Transportation (Non-Medical): No  Physical Activity: Not on file  Stress: Not on file  Social Connections: Not on file  Intimate Partner Violence: Not At Risk (04/04/2023)   Humiliation, Afraid, Rape, and Kick questionnaire    Fear of Current or Ex-Partner: No    Emotionally Abused: No    Physically Abused: No    Sexually Abused: No    Family History  Problem Relation Age of Onset   Kidney cancer Mother    Diabetes Father     Current Facility-Administered Medications  Medication Dose Route Frequency Provider Last Rate Last Admin   acetaminophen (TYLENOL) tablet 1,000 mg  1,000 mg Oral Trecia Rogers, MD   1,000 mg at 04/09/23 0981   ALPRAZolam Prudy Feeler) tablet 0.5 mg  0.5 mg Oral TID PRN Karie Soda, MD   0.5 mg at 04/08/23 2324   alum & mag hydroxide-simeth (MAALOX/MYLANTA) 200-200-20 MG/5ML suspension 30 mL  30  mL Oral Q6H PRN Karie Soda, MD   30 mL at 04/09/23 0430   bisacodyl (DULCOLAX) suppository 10 mg  10 mg Rectal Daily PRN Karie Soda, MD       cholecalciferol (VITAMIN D3) 25 MCG (1000 UNIT) tablet 1,000 Units  1,000 Units Oral Once per day on Sunday Tuesday Thursday Saturday Karie Soda, MD   1,000 Units at 04/09/23 0813   diphenhydrAMINE (BENADRYL) 12.5 MG/5ML elixir 12.5 mg  12.5 mg Oral Q6H PRN Karie Soda, MD       Or   diphenhydrAMINE (BENADRYL) injection 12.5 mg  12.5 mg Intravenous Q6H PRN Karie Soda, MD       enalaprilat (VASOTEC) injection 0.625-1.25 mg  0.625-1.25 mg Intravenous Q6H PRN Karie Soda, MD       enoxaparin (LOVENOX) injection 60 mg  60 mg Subcutaneous Q24H Karie Soda, MD   60 mg at 04/09/23 4098   estradiol (ESTRACE) tablet 1 mg  1 mg Oral Daily Karie Soda, MD   1 mg at 04/09/23 1191   ezetimibe (ZETIA) tablet 10 mg  10 mg Oral Daily Karie Soda,  MD   10 mg at 04/09/23 4782   FLUoxetine (PROZAC) capsule 30 mg  30 mg Oral Daily Karie Soda, MD   30 mg at 04/09/23 0814   fluticasone (FLONASE) 50 MCG/ACT nasal spray 1 spray  1 spray Each Nare Daily PRN Karie Soda, MD   1 spray at 04/08/23 0239   gabapentin (NEURONTIN) capsule 200 mg  200 mg Oral TID Karie Soda, MD   200 mg at 04/08/23 9562   HYDROmorphone (DILAUDID) injection 0.5-2 mg  0.5-2 mg Intravenous Q3H PRN Karie Soda, MD       ketoconazole (NIZORAL) 2 % cream 1 Application  1 Application Topical Daily Karie Soda, MD   1 Application at 04/06/23 1133   lactated ringers bolus 1,000 mL  1,000 mL Intravenous Q8H PRN Karie Soda, MD       levothyroxine (SYNTHROID) tablet 150 mcg  150 mcg Oral QAC breakfast Karie Soda, MD   150 mcg at 04/09/23 1308   magic mouthwash  15 mL Oral QID PRN Karie Soda, MD       magnesium hydroxide (MILK OF MAGNESIA) suspension 30 mL  30 mL Oral Daily PRN Karie Soda, MD       menthol-cetylpyridinium (CEPACOL) lozenge 3 mg  1 lozenge Oral PRN Karie Soda, MD       methocarbamol (ROBAXIN) tablet 1,000 mg  1,000 mg Oral Q6H PRN Karie Soda, MD   1,000 mg at 04/08/23 2126   Or   methocarbamol (ROBAXIN) 1,000 mg in dextrose 5 % 100 mL IVPB  1,000 mg Intravenous Q6H PRN Karie Soda, MD       metoprolol tartrate (LOPRESSOR) injection 5 mg  5 mg Intravenous Q6H PRN Karie Soda, MD       multivitamins with iron tablet 1 tablet  1 tablet Oral Daily Karie Soda, MD   1 tablet at 04/09/23 6578   naphazoline-glycerin (CLEAR EYES REDNESS) ophth solution 1-2 drop  1-2 drop Both Eyes QID PRN Karie Soda, MD       ondansetron (ZOFRAN-ODT) disintegrating tablet 4 mg  4 mg Oral Q6H PRN Karie Soda, MD       Or   ondansetron [facility]) injection 4 mg  4 mg Intravenous Q6H PRN Karie Soda, MD   4 mg at 04/07/23 1014   oxyCODONE (Oxy IR/ROXICODONE) immediate release tablet 10-15 mg  10-15 mg Oral Q4H PRN  Karie Soda, MD   15 mg at 04/09/23 0429    pantoprazole (PROTONIX) EC tablet 40 mg  40 mg Oral Daily Karie Soda, MD   40 mg at 04/09/23 0811   phenol (CHLORASEPTIC) mouth spray 2 spray  2 spray Mouth/Throat PRN Karie Soda, MD       polycarbophil (FIBERCON) tablet 625 mg  625 mg Oral BID Karie Soda, MD   625 mg at 04/09/23 4034   polyethylene glycol (MIRALAX / GLYCOLAX) packet 17 g  17 g Oral BID Karie Soda, MD   17 g at 04/09/23 7425   prochlorperazine (COMPAZINE) tablet 10 mg  10 mg Oral Q6H PRN Karie Soda, MD       Or   prochlorperazine (COMPAZINE) injection 5-10 mg  5-10 mg Intravenous Q6H PRN Karie Soda, MD   10 mg at 04/05/23 0616   simethicone (MYLICON) chewable tablet 40 mg  40 mg Oral Q6H PRN Karie Soda, MD       sodium chloride (OCEAN) 0.65 % nasal spray 1-2 spray  1-2 spray Each Nare Q6H PRN Karie Soda, MD         Allergies  Allergen Reactions   Amlodipine Besylate Other (See Comments)    Dizziness   Eszopiclone     Other reaction(s): Crazy dreams   Tape     Adhesive on medical tape and glue burns skin - requires paper tape.   Zocor [Simvastatin]     myalgias   Clindamycin Hcl Rash   Meloxicam Rash   Methylprednisolone Rash   Penicillin G Rash    Signed:   Ardeth Sportsman, MD, FACS, MASCRS Esophageal, Gastrointestinal & Colorectal Surgery Robotic and Minimally Invasive Surgery  [facility] [address] [place], [place] [address] [phone] Fax [phone] Main  CONTACT INFORMATION: Weekday (9AM-5PM): Call [facility] main office at [phone] Weeknight (5PM-9AM) or Weekend/Holiday: Check EPIC "Web Links" tab & use "AMION" (password " [email or web address]") for General Surgery [facility] coverage  Please, DO NOT use SecureChat  (it is not reliable communication to reach operating surgeons & will lead to a delay in care).   Epic staff messaging available for outptient concerns needing 1-2 business day response.      04/09/2023, 9:18  AM

## 2023-04-09 NOTE — Plan of Care (Signed)

## 2023-04-09 NOTE — Care Management Important Message (Signed)
Important Message  Patient Details IM Letter given. Name: Jacqueline Mora MRN: [id number] Date of Birth: Jun 21, 1956   Medicare Important Message Given:  Yes     [name] 04/09/2023, 10:06 AM

## 2023-04-09 NOTE — Telephone Encounter (Signed)
  Chief Complaint: patient has wound VAC alarming - preventa plus wound VAC  located on abdominal wound Symptoms: alarm sounding on wound VAC machine states leaking. Dressing intact per patient friend. Patient repositioned to stand and alarm stopped wound VAC working effectively again. Patient reports can feel it working again. Any movement or certain change in position causes alarm for leakage to sound.  Frequency: since getting home from hospital today  Pertinent Negatives: Patient denies wound leaking or drainage from wound. Wound VAC container not full. Disposition: [] ED /[] Urgent Care (no appt availability in office) / [] Appointment(In office/virtual)/ []  [facility]/ [] Home Care/ [] Refused Recommended Disposition /[] [place] [facility]/ [x]  Follow-up with PCP Additional Notes:   Recommended patient to change position to allow wound dressing to adhere and allow suction to work. Patient concerned she will not be able to lay down appropriately throughout night without wound VAC alarming. Recommended to contact surgeon for alternative wound care if wound VAC does not work well throughout night.  Patient requesting [facility] RN.     Reason for Disposition  [1] NPWT AND [2] device is alarming  (Exception: Triager able to troubleshoot and help caller-patient fix the alarm.)  Answer Assessment - Initial Assessment Questions 1. SYMPTOM or QUESTION: "What is your reason for calling today?" or "How can I best help you?" (e.g., bleeding, redness, drainage, pain)     Wound VAC machine is alarming and shows leakage 2. WOUND APPEARANCE: "What does the wound look like?" "Is there new or spreading redness?" If Yes, ask: "What is the size of the red area?" (Inches, centimeters, or compare to size of a coin).  "Has the drainage changed or increased?"  "Is there a new odor?"       Patient has difficulty viewing wound. Reports bandage over wound  3. ONSET: "When did the problem begin?"     Now  4.  LOCATION: "Where is the wound(s)?" (e.g., , ankle, lower left leg)     abdomen 5. BLEEDING: "Are you having a problem with bleeding?"  (e.g., amount, timing)     na 6. PAIN: "Is there any pain?" If Yes, ask: "How bad is the pain?" (Scale 1-10; or mild, moderate, severe)     na 7. FEVER: "Do you have a fever?" If Yes, ask: "What is your temperature, how was it measured, and when did it start?"     na 8. OTHER SYMPTOMS: "Do you have any other symptoms?" (e.g., chills, rash elsewhere, new weakness)     Wound VAC alarming shows leakage and alarms with any movement or repositioning  9. TREATMENT: "How have you been treating the wound?"     Just got home from hospital s/p hernia surgery 10. NEGATIVE PRESSURE WOUND THERAPY  (NPWT): "What concerns do you have about your negative pressure dressing?"  "When was your last dressing change?"  "Are you getting a device alert or alarm?"  "What have you done to try to fix the problem?"          Alarming shows leakage, standing decreases alarm sounding and wound VAC works again 11. PREGNANCY: "Is there any chance you are pregnant?" "When was your last menstrual period?       na  Protocols used: Wound - Chronic and Negative Pressure Wound Therapy-A-AH

## 2023-04-09 NOTE — Progress Notes (Signed)
D/c instructions reviewed w/ pt who verbalized an understanding. Wound vac will be switched over by primary RN Rosey Bath). No other questions at this time.

## 2023-04-09 NOTE — Progress Notes (Signed)
Pt changed over to portable wound vac and reviewed instructions w pt, all questions answered and she verbalized understanding. D/C via w/c w all belongings in stable condition

## 2023-04-12 DIAGNOSIS — Z8719 Personal history of other diseases of the digestive system: Secondary | ICD-10-CM | POA: Diagnosis not present

## 2023-04-12 DIAGNOSIS — Z9889 Other specified postprocedural states: Secondary | ICD-10-CM | POA: Diagnosis not present

## 2023-04-16 ENCOUNTER — Encounter: Payer: Self-pay | Admitting: Pharmacist

## 2023-04-16 ENCOUNTER — Encounter: Payer: Self-pay | Admitting: Podiatry

## 2023-04-16 DIAGNOSIS — I10 Essential (primary) hypertension: Secondary | ICD-10-CM

## 2023-04-17 ENCOUNTER — Emergency Department (HOSPITAL_COMMUNITY): Payer: Medicare Other

## 2023-04-17 ENCOUNTER — Other Ambulatory Visit: Payer: Self-pay

## 2023-04-17 ENCOUNTER — Encounter (HOSPITAL_COMMUNITY): Payer: Self-pay | Admitting: Emergency Medicine

## 2023-04-17 ENCOUNTER — Inpatient Hospital Stay (HOSPITAL_COMMUNITY)
Admission: EM | Admit: 2023-04-17 | Discharge: 2023-04-23 | DRG: 872 | Disposition: A | Discharge status: Home Health | Payer: Medicare Other | Attending: Surgery | Admitting: Surgery

## 2023-04-17 DIAGNOSIS — E860 Dehydration: Secondary | ICD-10-CM | POA: Diagnosis present

## 2023-04-17 DIAGNOSIS — Z1152 Encounter for screening for COVID-19: Secondary | ICD-10-CM

## 2023-04-17 DIAGNOSIS — L02211 Cutaneous abscess of abdominal wall: Secondary | ICD-10-CM | POA: Diagnosis present

## 2023-04-17 DIAGNOSIS — Y92009 Unspecified place in unspecified non-institutional (private) residence as the place of occurrence of the external cause: Secondary | ICD-10-CM

## 2023-04-17 DIAGNOSIS — E785 Hyperlipidemia, unspecified: Secondary | ICD-10-CM | POA: Diagnosis present

## 2023-04-17 DIAGNOSIS — N182 Chronic kidney disease, stage 2 (mild): Secondary | ICD-10-CM | POA: Diagnosis not present

## 2023-04-17 DIAGNOSIS — R5082 Postprocedural fever: Secondary | ICD-10-CM | POA: Diagnosis present

## 2023-04-17 DIAGNOSIS — E039 Hypothyroidism, unspecified: Secondary | ICD-10-CM | POA: Diagnosis present

## 2023-04-17 DIAGNOSIS — B9561 Methicillin susceptible Staphylococcus aureus infection as the cause of diseases classified elsewhere: Secondary | ICD-10-CM | POA: Diagnosis present

## 2023-04-17 DIAGNOSIS — Z79899 Other long term (current) drug therapy: Secondary | ICD-10-CM

## 2023-04-17 DIAGNOSIS — E876 Hypokalemia: Secondary | ICD-10-CM | POA: Diagnosis present

## 2023-04-17 DIAGNOSIS — Z880 Allergy status to penicillin: Secondary | ICD-10-CM

## 2023-04-17 DIAGNOSIS — R6520 Severe sepsis without septic shock: Secondary | ICD-10-CM | POA: Diagnosis not present

## 2023-04-17 DIAGNOSIS — R0902 Hypoxemia: Secondary | ICD-10-CM | POA: Diagnosis present

## 2023-04-17 DIAGNOSIS — E7800 Pure hypercholesterolemia, unspecified: Secondary | ICD-10-CM | POA: Diagnosis present

## 2023-04-17 DIAGNOSIS — F411 Generalized anxiety disorder: Secondary | ICD-10-CM | POA: Diagnosis present

## 2023-04-17 DIAGNOSIS — Z833 Family history of diabetes mellitus: Secondary | ICD-10-CM

## 2023-04-17 DIAGNOSIS — L7634 Postprocedural seroma of skin and subcutaneous tissue following other procedure: Secondary | ICD-10-CM | POA: Diagnosis not present

## 2023-04-17 DIAGNOSIS — I130 Hypertensive heart and chronic kidney disease with heart failure and stage 1 through stage 4 chronic kidney disease, or unspecified chronic kidney disease: Secondary | ICD-10-CM | POA: Diagnosis not present

## 2023-04-17 DIAGNOSIS — Z87891 Personal history of nicotine dependence: Secondary | ICD-10-CM | POA: Diagnosis not present

## 2023-04-17 DIAGNOSIS — G8918 Other acute postprocedural pain: Principal | ICD-10-CM

## 2023-04-17 DIAGNOSIS — Y838 Other surgical procedures as the cause of abnormal reaction of the patient, or of later complication, without mention of misadventure at the time of the procedure: Secondary | ICD-10-CM | POA: Diagnosis present

## 2023-04-17 DIAGNOSIS — Z90710 Acquired absence of both cervix and uterus: Secondary | ICD-10-CM

## 2023-04-17 DIAGNOSIS — Z881 Allergy status to other antibiotic agents status: Secondary | ICD-10-CM

## 2023-04-17 DIAGNOSIS — D631 Anemia in chronic kidney disease: Secondary | ICD-10-CM | POA: Diagnosis present

## 2023-04-17 DIAGNOSIS — R000 Tachycardia, unspecified: Secondary | ICD-10-CM | POA: Diagnosis not present

## 2023-04-17 DIAGNOSIS — N179 Acute kidney failure, unspecified: Secondary | ICD-10-CM | POA: Diagnosis present

## 2023-04-17 DIAGNOSIS — K439 Ventral hernia without obstruction or gangrene: Secondary | ICD-10-CM | POA: Diagnosis not present

## 2023-04-17 DIAGNOSIS — Z8616 Personal history of COVID-19: Secondary | ICD-10-CM | POA: Diagnosis not present

## 2023-04-17 DIAGNOSIS — R1030 Lower abdominal pain, unspecified: Secondary | ICD-10-CM | POA: Diagnosis not present

## 2023-04-17 DIAGNOSIS — Z6841 Body Mass Index (BMI) 40.0 and over, adult: Secondary | ICD-10-CM

## 2023-04-17 DIAGNOSIS — R112 Nausea with vomiting, unspecified: Secondary | ICD-10-CM | POA: Diagnosis not present

## 2023-04-17 DIAGNOSIS — Z86010 Personal history of colonic polyps: Secondary | ICD-10-CM

## 2023-04-17 DIAGNOSIS — R7303 Prediabetes: Secondary | ICD-10-CM | POA: Diagnosis not present

## 2023-04-17 DIAGNOSIS — K5909 Other constipation: Secondary | ICD-10-CM | POA: Diagnosis not present

## 2023-04-17 DIAGNOSIS — K219 Gastro-esophageal reflux disease without esophagitis: Secondary | ICD-10-CM | POA: Diagnosis present

## 2023-04-17 DIAGNOSIS — R609 Edema, unspecified: Secondary | ICD-10-CM | POA: Diagnosis not present

## 2023-04-17 DIAGNOSIS — Z888 Allergy status to other drugs, medicaments and biological substances status: Secondary | ICD-10-CM

## 2023-04-17 DIAGNOSIS — T39395A Adverse effect of other nonsteroidal anti-inflammatory drugs [NSAID], initial encounter: Secondary | ICD-10-CM | POA: Diagnosis present

## 2023-04-17 DIAGNOSIS — I10 Essential (primary) hypertension: Secondary | ICD-10-CM | POA: Diagnosis present

## 2023-04-17 DIAGNOSIS — Z79890 Hormone replacement therapy (postmenopausal): Secondary | ICD-10-CM

## 2023-04-17 DIAGNOSIS — Z8051 Family history of malignant neoplasm of kidney: Secondary | ICD-10-CM

## 2023-04-17 DIAGNOSIS — F419 Anxiety disorder, unspecified: Secondary | ICD-10-CM | POA: Diagnosis present

## 2023-04-17 DIAGNOSIS — Z85038 Personal history of other malignant neoplasm of large intestine: Secondary | ICD-10-CM

## 2023-04-17 DIAGNOSIS — Z86008 Personal history of in-situ neoplasm of other site: Secondary | ICD-10-CM

## 2023-04-17 DIAGNOSIS — A419 Sepsis, unspecified organism: Principal | ICD-10-CM

## 2023-04-17 DIAGNOSIS — T8141XA Infection following a procedure, superficial incisional surgical site, initial encounter: Secondary | ICD-10-CM | POA: Diagnosis not present

## 2023-04-17 DIAGNOSIS — K589 Irritable bowel syndrome without diarrhea: Secondary | ICD-10-CM | POA: Diagnosis present

## 2023-04-17 DIAGNOSIS — R509 Fever, unspecified: Secondary | ICD-10-CM | POA: Diagnosis not present

## 2023-04-17 DIAGNOSIS — R6881 Early satiety: Secondary | ICD-10-CM | POA: Diagnosis present

## 2023-04-17 DIAGNOSIS — K432 Incisional hernia without obstruction or gangrene: Secondary | ICD-10-CM | POA: Diagnosis present

## 2023-04-17 DIAGNOSIS — K449 Diaphragmatic hernia without obstruction or gangrene: Secondary | ICD-10-CM | POA: Diagnosis not present

## 2023-04-17 DIAGNOSIS — I5032 Chronic diastolic (congestive) heart failure: Secondary | ICD-10-CM | POA: Diagnosis not present

## 2023-04-17 LAB — I-STAT CG4 LACTIC ACID, ED: Lactic Acid, Venous: 1.5 mmol/L (ref 0.5–1.9)

## 2023-04-17 LAB — COMPREHENSIVE METABOLIC PANEL
ALT: 17 U/L (ref 0–44)
AST: 22 U/L (ref 15–41)
Albumin: 3.5 g/dL (ref 3.5–5.0)
Alkaline Phosphatase: 88 U/L (ref 38–126)
Anion gap: 14 (ref 5–15)
BUN: 11 mg/dL (ref 8–23)
CO2: 26 mmol/L (ref 22–32)
Calcium: 8.3 mg/dL — ABNORMAL LOW (ref 8.9–10.3)
Chloride: 92 mmol/L — ABNORMAL LOW (ref 98–111)
Creatinine, Ser: 0.98 mg/dL (ref 0.44–1.00)
GFR, Estimated: >60 mL/min (ref >60)
Glucose, Bld: 119 mg/dL — ABNORMAL HIGH (ref 70–99)
Potassium: 2.8 mmol/L — ABNORMAL LOW (ref 3.5–5.1)
Sodium: 132 mmol/L — ABNORMAL LOW (ref 135–145)
Total Bilirubin: 1.1 mg/dL (ref 0.3–1.2)
Total Protein: 6.7 g/dL (ref 6.5–8.1)

## 2023-04-17 LAB — CBC WITH DIFFERENTIAL/PLATELET
Abs Immature Granulocytes: 0.04 10*3/uL (ref 0.00–0.07)
Basophils Absolute: 0 10*3/uL (ref 0.0–0.1)
Basophils Relative: 0 %
Eosinophils Absolute: 0.2 10*3/uL (ref 0.0–0.5)
Eosinophils Relative: 2 %
HCT: 35 % — ABNORMAL LOW (ref 36.0–46.0)
Hemoglobin: 11.2 g/dL — ABNORMAL LOW (ref 12.0–15.0)
Immature Granulocytes: 0 %
Lymphocytes Relative: 5 %
Lymphs Abs: 0.7 10*3/uL (ref 0.7–4.0)
MCH: 29.1 pg (ref 26.0–34.0)
MCHC: 32 g/dL (ref 30.0–36.0)
MCV: 90.9 fL (ref 80.0–100.0)
Monocytes Absolute: 0.6 10*3/uL (ref 0.1–1.0)
Monocytes Relative: 4 %
Neutro Abs: 12 10*3/uL — ABNORMAL HIGH (ref 1.7–7.7)
Neutrophils Relative %: 89 %
Platelets: 330 10*3/uL (ref 150–400)
RBC: 3.85 MIL/uL — ABNORMAL LOW (ref 3.87–5.11)
RDW: 15.8 % — ABNORMAL HIGH (ref 11.5–15.5)
WBC: 13.6 10*3/uL — ABNORMAL HIGH (ref 4.0–10.5)
nRBC: 0 % (ref 0.0–0.2)

## 2023-04-17 LAB — PROTIME-INR
INR: 1 (ref 0.8–1.2)
Prothrombin Time: 13.4 seconds (ref 11.4–15.2)

## 2023-04-17 LAB — SARS CORONAVIRUS 2 BY RT PCR: SARS Coronavirus 2 by RT PCR: NEGATIVE

## 2023-04-17 LAB — APTT: aPTT: 31 seconds (ref 24–36)

## 2023-04-17 MED ORDER — ONDANSETRON HCL 4 MG/2ML IJ SOLN
4.0000 mg | Freq: Once | INTRAMUSCULAR | Status: AC
  Administered 2023-04-17: 4 mg via INTRAVENOUS
  Filled 2023-04-17: qty 2

## 2023-04-17 MED ORDER — SODIUM CHLORIDE 0.9 % IV SOLN
12.5000 mg | Freq: Four times a day (QID) | INTRAVENOUS | Status: DC | PRN
  Administered 2023-04-17 – 2023-04-18 (×3): 12.5 mg via INTRAVENOUS
  Filled 2023-04-17 (×3): qty 12.5

## 2023-04-17 MED ORDER — METRONIDAZOLE 500 MG/100ML IV SOLN
500.0000 mg | Freq: Once | INTRAVENOUS | Status: AC
  Administered 2023-04-17: 500 mg via INTRAVENOUS
  Filled 2023-04-17: qty 100

## 2023-04-17 MED ORDER — SODIUM CHLORIDE 0.9 % IV SOLN
2.0000 g | Freq: Once | INTRAVENOUS | Status: AC
  Administered 2023-04-17: 2 g via INTRAVENOUS
  Filled 2023-04-17: qty 12.5

## 2023-04-17 MED ORDER — POTASSIUM CHLORIDE 10 MEQ/100ML IV SOLN
10.0000 meq | INTRAVENOUS | Status: AC
  Administered 2023-04-17 (×2): 10 meq via INTRAVENOUS
  Filled 2023-04-17 (×2): qty 100

## 2023-04-17 MED ORDER — VANCOMYCIN HCL 2000 MG/400ML IV SOLN
2000.0000 mg | Freq: Once | INTRAVENOUS | Status: AC
  Administered 2023-04-17: 2000 mg via INTRAVENOUS
  Filled 2023-04-17: qty 400

## 2023-04-17 MED ORDER — IOHEXOL 300 MG/ML  SOLN
100.0000 mL | Freq: Once | INTRAMUSCULAR | Status: AC | PRN
  Administered 2023-04-17: 100 mL via INTRAVENOUS

## 2023-04-17 MED ORDER — LACTATED RINGERS IV BOLUS (SEPSIS)
1000.0000 mL | Freq: Once | INTRAVENOUS | Status: AC
  Administered 2023-04-17: 1000 mL via INTRAVENOUS

## 2023-04-17 NOTE — ED Provider Notes (Signed)
[facility] AT [facility] Provider Note   CSN: [id number] Arrival date & time: 04/17/23  1901     History {Add pertinent medical, surgical, social history, OB history to HPI:1} Chief Complaint  Patient presents with   Post-op Problem    Jacqueline Mora is a 67 y.o. female presented to ED complaining of fever, weakness, shaking chills beginning today.  The patient underwent emergent surgery with Dr. Karie Soda on 04/04/23 per my review of the medical records, with concern for incarcerated hernia, initially laparoscopic surgery then converted to open surgery, with mesh placement and laparoscopic lysis of adhesions per my review of the operative note.  Patient was discharged home 8 days ago on August 20, reports that Jacqueline Mora has had persistent abdominal pain since then.  HPI     Home Medications Prior to Admission medications   Medication Sig Start Date End Date Taking? Authorizing Provider  ALPRAZolam Prudy Feeler) 0.5 MG tablet Take 0.5 mg by mouth as needed for anxiety.    [provider]  cholecalciferol (VITAMIN D) 1000 units tablet Take 1,000 Units by mouth 4 (four) times a week.    [provider]  Cyanocobalamin (B-12 PO) Take 1 tablet by mouth 4 (four) times a week.    [provider]  estradiol (ESTRACE) 1 MG tablet Take 1 mg by mouth daily.    [provider]  ezetimibe (ZETIA) 10 MG tablet Take 10 mg by mouth daily. 02/25/23   [provider]  FLUoxetine (PROZAC) 10 MG capsule Take 30 mg by mouth daily.    [provider]  fluticasone (FLONASE) 50 MCG/ACT nasal spray Place 1 spray into both nostrils daily as needed for allergies. 12/12/16   [provider]  furosemide (LASIX) 40 MG tablet TAKE 1 TABLET(40 MG TOTAL) BY MOUTH DAILY 07/09/22   Chandrasekhar, Mahesh A, MD  ketoconazole (NIZORAL) 2 % cream Apply 1 Application topically daily. 08/22/21   [provider]  levothyroxine  (SYNTHROID) 150 MCG tablet Take 150 mcg by mouth daily before breakfast.    [provider]  losartan (COZAAR) 100 MG tablet Take 100 mg by mouth daily. 12/02/20   [provider]  methocarbamol (ROBAXIN) 500 MG tablet Take 1-2 tablets (500-1,000 mg total) by mouth every 8 (eight) hours as needed for muscle spasms. 04/09/23   Karie Soda, MD  oxyCODONE 10 MG TABS Take 1-1.5 tablets (10-15 mg total) by mouth every 6 (six) hours as needed for severe pain or breakthrough pain. 04/09/23   Karie Soda, MD  phentermine 15 MG capsule Take 15 mg by mouth every morning.    [provider]  RABEprazole (ACIPHEX) 20 MG tablet Take 20 mg by mouth daily.    [provider]  rosuvastatin (CRESTOR) 10 MG tablet Take 1 tablet (10 mg total) by mouth daily. Patient not taking: Reported on 03/15/2023 02/20/23 05/21/23  Riley Lam A, MD  spironolactone (ALDACTONE) 25 MG tablet TAKE ONE TABLET BY MOUTH DAILY DOSE INCREASE 10/08/22   Chandrasekhar, Mahesh A, MD  WEGOVY 0.25 MG/0.5ML SOAJ Inject 0.25 mg into the skin once a week. Patient not taking: Reported on 03/15/2023 02/20/23   Christell Constant, MD      Allergies    Amlodipine besylate, Eszopiclone, Tape, Zocor [simvastatin], Clindamycin hcl, Meloxicam, Methylprednisolone, and Penicillin g    Review of Systems   Review of Systems  Physical Exam Updated Vital Signs Ht 5' 5.5" (1.664 m)   Wt 127 kg  SpO2 97%   BMI 45.89 kg/m  Physical Exam Constitutional:      General: Jacqueline Mora is not in acute distress.    Appearance: Jacqueline Mora is obese.     Comments: Vomiting  HENT:     Head: Normocephalic and atraumatic.  Eyes:     Conjunctiva/sclera: Conjunctivae normal.     Pupils: Pupils are equal, round, and reactive to light.  Cardiovascular:     Rate and Rhythm: Normal rate and regular rhythm.  Pulmonary:     Effort: Pulmonary effort is normal. No respiratory distress.  Abdominal:     General: There is no distension.      Tenderness: There is no abdominal tenderness.     Comments: Large horizontal abdominal surgical incision site with erythema near incision site, no drainage, diffuse abdominal tenderness  Skin:    General: Skin is warm and dry.  Neurological:     General: No focal deficit present.     Mental Status: Jacqueline Mora is alert. Mental status is at baseline.  Psychiatric:        Mood and Affect: Mood normal.        Behavior: Behavior normal.     ED Results / Procedures / Treatments   Labs (all labs ordered are listed, but only abnormal results are displayed) Labs Reviewed  CULTURE, BLOOD (ROUTINE X 2)  CULTURE, BLOOD (ROUTINE X 2)  SARS CORONAVIRUS 2 BY RT PCR  COMPREHENSIVE METABOLIC PANEL  CBC WITH DIFFERENTIAL/PLATELET  PROTIME-INR  APTT  I-STAT CG4 LACTIC ACID, ED    EKG None  Radiology No results found.  Procedures Procedures  {Document cardiac monitor, telemetry assessment procedure when appropriate:1}  Medications Ordered in ED Medications  lactated ringers bolus 1,000 mL (has no administration in time range)  ondansetron (ZOFRAN) injection 4 mg (has no administration in time range)    ED Course/ Medical Decision Making/ A&P   {   Click here for ABCD2, HEART and other calculatorsREFRESH Note before signing :1}                              Medical Decision Making Amount and/or Complexity of Data Reviewed Labs: ordered. Radiology: ordered. ECG/medicine tests: ordered.  Risk Prescription drug management.   This patient presents to the ED with concern for nausea, vomiting, fevers, chills, abdominal pain. This involves an extensive number of treatment options, and is a complaint that carries with it a high risk of complications and morbidity.  The differential diagnosis includes postoperative infection including abscess versus bowel obstruction versus other intra-abdominal process, versus viral illness, versus  Patient is febrile and tachycardic on arrival, meeting  SIRS criteria with concern for sepsis.  Blood work has been ordered.  Co-morbidities that complicate the patient evaluation: Recent surgery at high risk of postoperative complication  Additional history obtained from EMS  External records from outside source obtained and reviewed including hospital discharge summary from August as well as operative note and course  I ordered and personally interpreted labs.  The pertinent results include:  ***  I ordered imaging studies including x-ray of the chest, CT abdomen pelvis I independently visualized and interpreted imaging which showed *** I agree with the radiologist interpretation  The patient was maintained on a cardiac monitor.  I personally viewed and interpreted the cardiac monitored which showed an underlying rhythm of: ***  Per my interpretation the patient's ECG shows ***  I ordered medication including ***  for ***  I have reviewed the patients home medicines and have made adjustments as needed  Test Considered: ***  I requested consultation with the ***,  and discussed lab and imaging findings as well as pertinent plan - they recommend: ***  After the interventions noted above, I reevaluated the patient and found that they have: {resolved/improved/worsened:23923::"improved"}  Social Determinants of Health:***  Dispostion:  After consideration of the diagnostic results and the patients response to treatment, I feel that the patent would benefit from ***.   {Document critical care time when appropriate:1} {Document review of labs and clinical decision tools ie heart score, Chads2Vasc2 etc:1}  {Document your independent review of radiology images, and any outside records:1} {Document your discussion with family members, caretakers, and with consultants:1} {Document social determinants of health affecting pt's care:1} {Document your decision making why or why not admission, treatments were needed:1} Final Clinical  Impression(s) / ED Diagnoses Final diagnoses:  None    Rx / DC Orders ED Discharge Orders     None

## 2023-04-17 NOTE — H&P (Signed)
Jacqueline Mora is an 67 y.o. female.   Chief Complaint: Fever HPI: The patient is a 67 year old white female who is about 2 weeks status post repair of a complicated hernia by Dr. Michaell Cowing.  She was doing well at home but over the last day or so she has developed fever and feels terrible.  She denies any drainage from the wound.  She has had some nausea and vomiting.  Past Medical History:  Diagnosis Date   Abdominal hernia    Anemia    Anxiety    BMI 45.0-49.9, adult (HCC)    Cancer (HCC)    Colon cancer (HCC)    COVID-19    Depression    GERD (gastroesophageal reflux disease)    History of colon polyps    Hypercholesteremia    Hypertension    occ   Hypothyroidism    IBS (irritable bowel syndrome)    Mitral valve problem    Petechiae 04/04/2023   red patches after ankle   PONV (postoperative nausea and vomiting)    Prediabetes    Squamous cell carcinoma in situ (SCCIS) of skin    Thyroid disease    Vitamin D deficiency     Past Surgical History:  Procedure Laterality Date   ABDOMINAL HYSTERECTOMY     BLADDER NECK SUSPENSION     CESAREAN SECTION     x 2   colon cancer     COLONOSCOPY     HERNIA REPAIR     Mesh placed then later removed due to infection.   laparostomy     VENTRAL HERNIA REPAIR N/A 04/04/2023   Procedure: LAPAROSCOPIC VENTRAL WALL HERNIA WITH LYSIS OF ADHESIONS AND PANNICULECTOMY;  Surgeon: Karie Soda, MD;  Location: [facility];  Service: General;  Laterality: N/A;    Family History  Problem Relation Age of Onset   Kidney cancer Mother    Diabetes Father    Social History:  reports that she has quit smoking. Her smoking use included cigarettes. She has never used smokeless tobacco. She reports that she does not currently use alcohol. She reports that she does not use drugs.  Allergies:  Allergies  Allergen Reactions   Amlodipine Besylate Other (See Comments)    Dizziness   Eszopiclone     Other reaction(s): Crazy dreams   Tape     Adhesive on  medical tape and glue burns skin - requires paper tape.   Zocor [Simvastatin]     myalgias   Clindamycin Hcl Rash   Meloxicam Rash   Methylprednisolone Rash   Penicillin G Rash    (Not in a hospital admission)   Results for orders placed or performed during the hospital encounter of 04/17/23 (from the past 48 hour(s))  Comprehensive metabolic panel     Status: Abnormal   Collection Time: 04/17/23  7:42 PM  Result Value Ref Range   Sodium 132 (L) 135 - 145 mmol/L   Potassium 2.8 (L) 3.5 - 5.1 mmol/L   Chloride 92 (L) 98 - 111 mmol/L   CO2 26 22 - 32 mmol/L   Glucose, Bld 119 (H) 70 - 99 mg/dL    Comment: Glucose reference range applies only to samples taken after fasting for at least 8 hours.   BUN 11 8 - 23 mg/dL   Creatinine, Ser 1.61 0.44 - 1.00 mg/dL   Calcium 8.3 (L) 8.9 - 10.3 mg/dL   Total Protein 6.7 6.5 - 8.1 g/dL   Albumin 3.5 3.5 - 5.0 g/dL  AST 22 15 - 41 U/L   ALT 17 0 - 44 U/L   Alkaline Phosphatase 88 38 - 126 U/L   Total Bilirubin 1.1 0.3 - 1.2 mg/dL   GFR, Estimated >54 >09 mL/min    Comment: (NOTE) Calculated using the CKD-EPI Creatinine Equation (2021)    Anion gap 14 5 - 15    Comment: Performed at [facility], [address] [place], [place] [address]  CBC with Differential     Status: Abnormal   Collection Time: 04/17/23  7:42 PM  Result Value Ref Range   WBC 13.6 (H) 4.0 - 10.5 K/uL   RBC 3.85 (L) 3.87 - 5.11 MIL/uL   Hemoglobin 11.2 (L) 12.0 - 15.0 g/dL   HCT 47.8 (L) 29.5 - 62.1 %   MCV 90.9 80.0 - 100.0 fL   MCH 29.1 26.0 - 34.0 pg   MCHC 32.0 30.0 - 36.0 g/dL   RDW 30.8 (H) 65.7 - 84.6 %   Platelets 330 150 - 400 K/uL   nRBC 0.0 0.0 - 0.2 %   Neutrophils Relative % 89 %   Neutro Abs 12.0 (H) 1.7 - 7.7 K/uL   Lymphocytes Relative 5 %   Lymphs Abs 0.7 0.7 - 4.0 K/uL   Monocytes Relative 4 %   Monocytes Absolute 0.6 0.1 - 1.0 K/uL   Eosinophils Relative 2 %   Eosinophils Absolute 0.2 0.0 - 0.5 K/uL   Basophils  Relative 0 %   Basophils Absolute 0.0 0.0 - 0.1 K/uL   Immature Granulocytes 0 %   Abs Immature Granulocytes 0.04 0.00 - 0.07 K/uL    Comment: Performed at [facility], [address] [place], [place] [address]  Protime-INR     Status: None   Collection Time: 04/17/23  7:42 PM  Result Value Ref Range   Prothrombin Time 13.4 11.4 - 15.2 seconds   INR 1.0 0.8 - 1.2    Comment: (NOTE) INR goal varies based on device and disease states. Performed at [facility], [address], [place], [place] [address]   APTT     Status: None   Collection Time: 04/17/23  7:42 PM  Result Value Ref Range   aPTT 31 24 - 36 seconds    Comment: Performed at [facility], [address] [place], [place] [address]  I-Stat Lactic Acid, ED     Status: None   Collection Time: 04/17/23  7:55 PM  Result Value Ref Range   Lactic Acid, Venous 1.5 0.5 - 1.9 mmol/L  SARS Coronavirus 2 by RT PCR (hospital order, performed in [facility] hospital lab) *cepheid single result test* Anterior Nasal Swab     Status: None   Collection Time: 04/17/23  8:25 PM   Specimen: Anterior Nasal Swab  Result Value Ref Range   SARS Coronavirus 2 by RT PCR NEGATIVE NEGATIVE    Comment: (NOTE) SARS-CoV-2 target nucleic acids are NOT DETECTED.  The SARS-CoV-2 RNA is generally detectable in upper and lower respiratory specimens during the acute phase of infection. The lowest concentration of SARS-CoV-2 viral copies this assay can detect is 250 copies / mL. A negative result does not preclude SARS-CoV-2 infection and should not be used as the sole basis for treatment or other patient management decisions.  A negative result may occur with improper specimen collection / handling, submission of specimen other than nasopharyngeal swab, presence of viral mutation(s) within the areas targeted by this assay, and  inadequate number of viral copies (<250 copies / mL). A negative  result must be combined with clinical observations, patient history, and epidemiological information.  Fact Sheet for Patients:   RoadLapTop.co.za  Fact Sheet for Healthcare Providers: http://kim-miller.com/  This test is not yet approved or  cleared by the [place] FDA and has been authorized for detection and/or diagnosis of SARS-CoV-2 by FDA under an Emergency Use Authorization (EUA).  This EUA will remain in effect (meaning this test can be used) for the duration of the COVID-19 declaration under Section 564(b)(1) of the Act, 21 U.S.C. section 360bbb-3(b)(1), unless the authorization is terminated or revoked sooner.  Performed at [facility], [address] [place], [place] [address]    CT ABDOMEN PELVIS W CONTRAST  Result Date: 04/17/2023 CLINICAL DATA:  Ventral hernia open repair 2 weeks ago with mesh replacement, lower abdominal pain EXAM: CT ABDOMEN AND PELVIS WITH CONTRAST TECHNIQUE: Multidetector CT imaging of the abdomen and pelvis was performed using the standard protocol following bolus administration of intravenous contrast. RADIATION DOSE REDUCTION: This exam was performed according to the departmental dose-optimization program which includes automated exposure control, adjustment of the mA and/or kV according to patient size and/or use of iterative reconstruction technique. CONTRAST:  OMNIPAQUE IOHEXOL 300 MG/ML  SOLN COMPARISON:  01/04/2023 FINDINGS: Lower chest: Small type 1 hiatal hernia. Hepatobiliary: Unremarkable Pancreas: Unremarkable Spleen: Unremarkable Adrenals/Urinary Tract: Unremarkable Stomach/Bowel: Mast Modic staple line in the right small bowel (image 56, series 2) and in the rectum (image 74 series 2.). Normal appendix. No dilated bowel were substantially abnormal thick-walled bowel. Vascular/Lymphatic: Unremarkable Reproductive: Uterus absent.  Adnexa unremarkable. Other: No ascites.  Musculoskeletal: The dominant complex ventral hernia has been repaired with mesh in place. There is edema and stranding along the mesh, not necessarily unexpected in this postoperative stage, there is also greater than expected nodularity in the omentum cephalad to the mesh for example on images 27 through 38 of series 2. Some of this may be from omental edema but the possibility of omental infarct or infectious process along the omentum is not totally excluded. Do not see any gas to indicate an abscess in this region. There were two previous upper abdominal hernias containing adipose tissue, the most cephalad has mildly enlarged with herniated adipose tissue currently measuring 4.4 by 2.0 by 2.9 cm. The more caudad, which is probably covered by the mesh, now presents as small lobulations of fluid density in the subcutaneous tissues at the site of prior hernia for example on images 41 through 48 of series 4. I am uncertain if this represents residual hernia sac containing fluid versus simply some subcutaneous nodularity. There is a large tubular fluid density process along the pannus along with reduction in volume of pannus, compatible with panniculectomy. Surrounding subcutaneous stranding is present. Miniscule loculation of gas along the right margin of this panniculectomy site on image 52 series 2, much less than 1 cc, possibly incidental. I do not see abnormal enhancement along the margins of the panniculectomy site to indicate an abscess. Correlate with quality of drainage at the wound site, if any. Lower lumbar spondylosis and degenerative disc disease causing right foraminal impingement at L4-5 and left foraminal impingement at L5-S1. Mildly transitional S1 vertebra. IMPRESSION: 1. Interval panniculectomy with a large tubular fluid density process along the pannus, but no rim enhancement to indicate abscess. Miniscule loculation of gas along the right margin of this panniculectomy site, much less than 1 cc,  possibly incidental. Correlate  with quality of drainage at the wound site, if any. 2. Interval repair of the dominant complex ventral hernia with mesh in place. There is edema and stranding along the mesh, not necessarily unexpected in this postoperative stage, but there is also greater than expected nodularity in the omentum cephalad to the mesh. Some of this may be from omental edema but the possibility of omental infarct or infectious process along the upper omentum is not totally excluded. 3. There were two previous upper abdominal hernias containing adipose tissue, the most cephalad has mildly enlarged and at the site of the more caudad there is lobular fluid density in the subcutaneous tissues. 4. Small type 1 hiatal hernia. 5. Lower lumbar spondylosis and degenerative disc disease causing right foraminal impingement at L4-5 and left foraminal impingement at L5-S1. Electronically Signed   By: Gaylyn Rong M.D.   On: 04/17/2023 21:26   DG Chest Port 1 View  Result Date: 04/17/2023 CLINICAL DATA:  Sepsis, recent hernia repair EXAM: PORTABLE CHEST 1 VIEW COMPARISON:  04/06/2023 FINDINGS: Single frontal view of the chest demonstrates an unremarkable cardiac silhouette. Improved aeration at the lung bases. No airspace disease, effusion, or pneumothorax. No acute bony abnormalities. IMPRESSION: 1. No acute intrathoracic process. Electronically Signed   By: Sharlet Salina M.D.   On: 04/17/2023 20:07    Review of Systems  Constitutional:  Positive for fever.  HENT: Negative.    Eyes: Negative.   Respiratory: Negative.    Cardiovascular: Negative.   Gastrointestinal:  Positive for abdominal pain and nausea.  Endocrine: Negative.   Genitourinary: Negative.   Musculoskeletal: Negative.   Skin: Negative.   Allergic/Immunologic: Negative.   Neurological: Negative.   Hematological: Negative.   Psychiatric/Behavioral: Negative.      Blood pressure (!) 148/79, pulse (!) 117, temperature (!) 100.4  F (38 C), temperature source Oral, resp. rate (!) 34, height 5' 5.5" (1.664 m), weight 127 kg, SpO2 91%. Physical Exam Vitals reviewed.  Constitutional:      General: She is not in acute distress.    Appearance: Normal appearance. She is obese.  HENT:     Head: Normocephalic and atraumatic.     Right Ear: External ear normal.     Left Ear: External ear normal.     Nose: Nose normal.     Mouth/Throat:     Mouth: Mucous membranes are dry.     Pharynx: Oropharynx is clear.  Eyes:     Extraocular Movements: Extraocular movements intact.     Conjunctiva/sclera: Conjunctivae normal.     Pupils: Pupils are equal, round, and reactive to light.  Cardiovascular:     Rate and Rhythm: Normal rate and regular rhythm.     Pulses: Normal pulses.     Heart sounds: Normal heart sounds.  Pulmonary:     Effort: Pulmonary effort is normal. No respiratory distress.     Breath sounds: Normal breath sounds.  Abdominal:     General: Abdomen is flat.     Palpations: Abdomen is soft.     Comments: There is very mild abdominal tenderness.  There is no guarding.  The lower panniculectomy incision seems to be healing well with minimal redness  Musculoskeletal:        General: No swelling or deformity. Normal range of motion.     Cervical back: Normal range of motion and neck supple.  Skin:    General: Skin is warm and dry.     Coloration: Skin is not jaundiced.  Neurological:  General: No focal deficit present.     Mental Status: She is alert and oriented to person, place, and time.  Psychiatric:        Mood and Affect: Mood normal.        Behavior: Behavior normal.      Assessment/Plan The patient has new onset fever during her postoperative period.  It is not exactly clear at this point where the fever is coming from.  Her wounds look as expected from her previous recent surgery.  At this point we will admit her to the hospital and start her on broad-spectrum antibiotic therapy.  We will  hydrate her and monitor her closely.  I will discuss this with her primary surgeon in the morning.  Chevis Pretty III, MD 04/17/2023, 11:09 PM

## 2023-04-17 NOTE — ED Triage Notes (Signed)
Pt present via [facility] from home with post op issue. Pt had hernia repair last Wednesday. Pt received 4mg  Zofran and NS from EMS prior to arrival. Pt A&O x4.

## 2023-04-17 NOTE — Progress Notes (Signed)
A consult was received from an ED physician for Vancomycin and Zosyn per pharmacy dosing.  The patient's profile has been reviewed for ht/wt/allergies/indication/available labs.    Patient with PCN allergy (rash). No record of penicillin use in EPIC.  Per Epic, patient has received cephalosporins in the past.  Messaged Dr Renaye Rakers who instructed to d/c Zosyn and give Cefepime and Metronidazole instead.  A one time order has been placed for Vancomycin 2gm IV, Cefepime 2gm IV and Metronidazole 500mg  IV.   Further antibiotics/pharmacy consults should be ordered by admitting physician if indicated.                       Thank you, Maryellen Pile, PharmD 04/17/2023  8:36 PM

## 2023-04-18 ENCOUNTER — Observation Stay (HOSPITAL_COMMUNITY): Payer: Medicare Other

## 2023-04-18 ENCOUNTER — Encounter (HOSPITAL_COMMUNITY): Payer: Medicare Other

## 2023-04-18 DIAGNOSIS — I130 Hypertensive heart and chronic kidney disease with heart failure and stage 1 through stage 4 chronic kidney disease, or unspecified chronic kidney disease: Secondary | ICD-10-CM | POA: Diagnosis present

## 2023-04-18 DIAGNOSIS — Z79890 Hormone replacement therapy (postmenopausal): Secondary | ICD-10-CM | POA: Diagnosis not present

## 2023-04-18 DIAGNOSIS — E860 Dehydration: Secondary | ICD-10-CM | POA: Diagnosis present

## 2023-04-18 DIAGNOSIS — E876 Hypokalemia: Secondary | ICD-10-CM | POA: Diagnosis present

## 2023-04-18 DIAGNOSIS — L02211 Cutaneous abscess of abdominal wall: Secondary | ICD-10-CM | POA: Diagnosis present

## 2023-04-18 DIAGNOSIS — D631 Anemia in chronic kidney disease: Secondary | ICD-10-CM | POA: Diagnosis present

## 2023-04-18 DIAGNOSIS — R112 Nausea with vomiting, unspecified: Secondary | ICD-10-CM | POA: Diagnosis present

## 2023-04-18 DIAGNOSIS — R5082 Postprocedural fever: Secondary | ICD-10-CM

## 2023-04-18 DIAGNOSIS — R6520 Severe sepsis without septic shock: Secondary | ICD-10-CM | POA: Diagnosis present

## 2023-04-18 DIAGNOSIS — N182 Chronic kidney disease, stage 2 (mild): Secondary | ICD-10-CM | POA: Diagnosis present

## 2023-04-18 DIAGNOSIS — E7800 Pure hypercholesterolemia, unspecified: Secondary | ICD-10-CM | POA: Diagnosis present

## 2023-04-18 DIAGNOSIS — Y92009 Unspecified place in unspecified non-institutional (private) residence as the place of occurrence of the external cause: Secondary | ICD-10-CM | POA: Diagnosis not present

## 2023-04-18 DIAGNOSIS — A419 Sepsis, unspecified organism: Secondary | ICD-10-CM | POA: Diagnosis present

## 2023-04-18 DIAGNOSIS — E039 Hypothyroidism, unspecified: Secondary | ICD-10-CM | POA: Diagnosis present

## 2023-04-18 DIAGNOSIS — F411 Generalized anxiety disorder: Secondary | ICD-10-CM | POA: Diagnosis present

## 2023-04-18 DIAGNOSIS — Z87891 Personal history of nicotine dependence: Secondary | ICD-10-CM | POA: Diagnosis not present

## 2023-04-18 DIAGNOSIS — K219 Gastro-esophageal reflux disease without esophagitis: Secondary | ICD-10-CM | POA: Diagnosis present

## 2023-04-18 DIAGNOSIS — Z8616 Personal history of COVID-19: Secondary | ICD-10-CM | POA: Diagnosis not present

## 2023-04-18 DIAGNOSIS — Z6841 Body Mass Index (BMI) 40.0 and over, adult: Secondary | ICD-10-CM | POA: Diagnosis not present

## 2023-04-18 DIAGNOSIS — I5032 Chronic diastolic (congestive) heart failure: Secondary | ICD-10-CM | POA: Diagnosis present

## 2023-04-18 DIAGNOSIS — Y838 Other surgical procedures as the cause of abnormal reaction of the patient, or of later complication, without mention of misadventure at the time of the procedure: Secondary | ICD-10-CM | POA: Diagnosis present

## 2023-04-18 DIAGNOSIS — L7634 Postprocedural seroma of skin and subcutaneous tissue following other procedure: Secondary | ICD-10-CM | POA: Diagnosis present

## 2023-04-18 DIAGNOSIS — Z1152 Encounter for screening for COVID-19: Secondary | ICD-10-CM | POA: Diagnosis not present

## 2023-04-18 DIAGNOSIS — N179 Acute kidney failure, unspecified: Secondary | ICD-10-CM | POA: Diagnosis present

## 2023-04-18 DIAGNOSIS — K5909 Other constipation: Secondary | ICD-10-CM | POA: Diagnosis present

## 2023-04-18 DIAGNOSIS — R609 Edema, unspecified: Secondary | ICD-10-CM | POA: Diagnosis not present

## 2023-04-18 DIAGNOSIS — R7303 Prediabetes: Secondary | ICD-10-CM | POA: Diagnosis present

## 2023-04-18 DIAGNOSIS — Z880 Allergy status to penicillin: Secondary | ICD-10-CM | POA: Diagnosis not present

## 2023-04-18 LAB — RESPIRATORY PANEL BY PCR

## 2023-04-18 LAB — MAGNESIUM: Magnesium: 1.6 mg/dL — ABNORMAL LOW (ref 1.7–2.4)

## 2023-04-18 LAB — COMPREHENSIVE METABOLIC PANEL
ALT: 14 U/L (ref 0–44)
AST: 18 U/L (ref 15–41)
Albumin: 2.8 g/dL — ABNORMAL LOW (ref 3.5–5.0)
Alkaline Phosphatase: 68 U/L (ref 38–126)
Anion gap: 14 (ref 5–15)
BUN: 11 mg/dL (ref 8–23)
CO2: 25 mmol/L (ref 22–32)
Calcium: 7.9 mg/dL — ABNORMAL LOW (ref 8.9–10.3)
Chloride: 96 mmol/L — ABNORMAL LOW (ref 98–111)
Creatinine, Ser: 1.24 mg/dL — ABNORMAL HIGH (ref 0.44–1.00)
GFR, Estimated: 48 mL/min — ABNORMAL LOW (ref >60)
Glucose, Bld: 120 mg/dL — ABNORMAL HIGH (ref 70–99)
Potassium: 3.1 mmol/L — ABNORMAL LOW (ref 3.5–5.1)
Sodium: 135 mmol/L (ref 135–145)
Total Bilirubin: 1 mg/dL (ref 0.3–1.2)
Total Protein: 5.4 g/dL — ABNORMAL LOW (ref 6.5–8.1)

## 2023-04-18 LAB — URINALYSIS, ROUTINE W REFLEX MICROSCOPIC
Bilirubin Urine: NEGATIVE
Glucose, UA: NEGATIVE mg/dL
Hgb urine dipstick: NEGATIVE
Ketones, ur: 5 mg/dL — AB
Leukocytes,Ua: NEGATIVE
Nitrite: NEGATIVE
Protein, ur: NEGATIVE mg/dL
Specific Gravity, Urine: 1.029 (ref 1.005–1.030)
pH: 5 (ref 5.0–8.0)

## 2023-04-18 LAB — CBC
HCT: 32.7 % — ABNORMAL LOW (ref 36.0–46.0)
Hemoglobin: 10 g/dL — ABNORMAL LOW (ref 12.0–15.0)
MCH: 28.5 pg (ref 26.0–34.0)
MCHC: 30.6 g/dL (ref 30.0–36.0)
MCV: 93.2 fL (ref 80.0–100.0)
Platelets: 305 10*3/uL (ref 150–400)
RBC: 3.51 MIL/uL — ABNORMAL LOW (ref 3.87–5.11)
RDW: 15.9 % — ABNORMAL HIGH (ref 11.5–15.5)
WBC: 17.2 10*3/uL — ABNORMAL HIGH (ref 4.0–10.5)
nRBC: 0 % (ref 0.0–0.2)

## 2023-04-18 LAB — HIV ANTIBODY (ROUTINE TESTING W REFLEX): HIV Screen 4th Generation wRfx: NONREACTIVE

## 2023-04-18 MED ORDER — POTASSIUM CHLORIDE CRYS ER 20 MEQ PO TBCR
40.0000 meq | EXTENDED_RELEASE_TABLET | Freq: Two times a day (BID) | ORAL | Status: DC
  Administered 2023-04-18 – 2023-04-20 (×5): 40 meq via ORAL
  Filled 2023-04-18 (×5): qty 2

## 2023-04-18 MED ORDER — PHENOL 1.4 % MT LIQD: 2.0000 | OROMUCOSAL | Status: DC | PRN

## 2023-04-18 MED ORDER — ALBUTEROL SULFATE (2.5 MG/3ML) 0.083% IN NEBU: 2.5000 mg | INHALATION_SOLUTION | Freq: Four times a day (QID) | RESPIRATORY_TRACT | Status: DC | PRN

## 2023-04-18 MED ORDER — LOSARTAN POTASSIUM 50 MG PO TABS: 100.0000 mg | ORAL_TABLET | Freq: Every day | ORAL | Status: DC

## 2023-04-18 MED ORDER — CEFAZOLIN SODIUM-DEXTROSE 2-4 GM/100ML-% IV SOLN: 2.0000 g | Freq: Three times a day (TID) | INTRAVENOUS | Status: DC

## 2023-04-18 MED ORDER — METRONIDAZOLE 500 MG/100ML IV SOLN
500.0000 mg | Freq: Two times a day (BID) | INTRAVENOUS | Status: DC
  Administered 2023-04-18: 500 mg via INTRAVENOUS
  Filled 2023-04-18: qty 100

## 2023-04-18 MED ORDER — SODIUM CHLORIDE 0.9 % IV SOLN: INTRAVENOUS | Status: DC

## 2023-04-18 MED ORDER — PIPERACILLIN-TAZOBACTAM 3.375 G IVPB
3.3750 g | Freq: Three times a day (TID) | INTRAVENOUS | Status: DC
Start: 2023-04-18 — End: 2023-04-18

## 2023-04-18 MED ORDER — SALINE SPRAY 0.65 % NA SOLN: 1.0000 | Freq: Four times a day (QID) | NASAL | Status: DC | PRN

## 2023-04-18 MED ORDER — PROCHLORPERAZINE EDISYLATE 10 MG/2ML IJ SOLN: 5.0000 mg | Freq: Four times a day (QID) | INTRAMUSCULAR | Status: DC | PRN

## 2023-04-18 MED ORDER — EZETIMIBE 10 MG PO TABS
10.0000 mg | ORAL_TABLET | Freq: Every day | ORAL | Status: DC
  Administered 2023-04-18 – 2023-04-23 (×6): 10 mg via ORAL
  Filled 2023-04-18 (×6): qty 1

## 2023-04-18 MED ORDER — LIDOCAINE HCL (PF) 1 % IJ SOLN
INTRAMUSCULAR | Status: AC | PRN
  Administered 2023-04-18 (×2): 10 mL via SUBCUTANEOUS

## 2023-04-18 MED ORDER — SIMETHICONE 40 MG/0.6ML PO SUSP: 80.0000 mg | Freq: Four times a day (QID) | ORAL | Status: DC | PRN

## 2023-04-18 MED ORDER — POTASSIUM CHLORIDE 10 MEQ/100ML IV SOLN
10.0000 meq | Freq: Once | INTRAVENOUS | Status: AC
  Administered 2023-04-18: 10 meq via INTRAVENOUS
  Filled 2023-04-18: qty 100

## 2023-04-18 MED ORDER — MAGIC MOUTHWASH: 15.0000 mL | Freq: Four times a day (QID) | ORAL | Status: DC | PRN

## 2023-04-18 MED ORDER — DIPHENHYDRAMINE HCL 50 MG/ML IJ SOLN: 12.5000 mg | Freq: Four times a day (QID) | INTRAMUSCULAR | Status: DC | PRN

## 2023-04-18 MED ORDER — ACETAMINOPHEN 325 MG PO TABS
325.0000 mg | ORAL_TABLET | Freq: Four times a day (QID) | ORAL | Status: DC | PRN
  Administered 2023-04-18: 650 mg via ORAL
  Filled 2023-04-18: qty 2

## 2023-04-18 MED ORDER — LEVOTHYROXINE SODIUM 50 MCG PO TABS: 150.0000 ug | ORAL_TABLET | Freq: Every day | ORAL | Status: DC

## 2023-04-18 MED ORDER — ACETAMINOPHEN 650 MG RE SUPP: 650.0000 mg | Freq: Four times a day (QID) | RECTAL | Status: DC | PRN

## 2023-04-18 MED ORDER — IPRATROPIUM-ALBUTEROL 0.5-2.5 (3) MG/3ML IN SOLN: 3.0000 mL | Freq: Four times a day (QID) | RESPIRATORY_TRACT | Status: DC

## 2023-04-18 MED ORDER — METHOCARBAMOL 1000 MG/10ML IJ SOLN: 1000.0000 mg | Freq: Four times a day (QID) | INTRAVENOUS | Status: DC | PRN

## 2023-04-18 MED ORDER — LEVOTHYROXINE SODIUM 75 MCG PO TABS
150.0000 ug | ORAL_TABLET | Freq: Every day | ORAL | Status: DC
  Administered 2023-04-18 – 2023-04-23 (×6): 150 ug via ORAL
  Filled 2023-04-18 (×6): qty 2

## 2023-04-18 MED ORDER — ALUM & MAG HYDROXIDE-SIMETH 200-200-20 MG/5ML PO SUSP
30.0000 mL | Freq: Four times a day (QID) | ORAL | Status: DC | PRN
  Administered 2023-04-18 – 2023-04-21 (×3): 30 mL via ORAL
  Filled 2023-04-18 (×3): qty 30

## 2023-04-18 MED ORDER — ENOXAPARIN SODIUM 30 MG/0.3ML IJ SOSY
30.0000 mg | PREFILLED_SYRINGE | INTRAMUSCULAR | Status: DC
  Administered 2023-04-18 – 2023-04-19 (×2): 30 mg via SUBCUTANEOUS
  Filled 2023-04-18 (×2): qty 0.3

## 2023-04-18 MED ORDER — IBUPROFEN 400 MG PO TABS
800.0000 mg | ORAL_TABLET | Freq: Four times a day (QID) | ORAL | Status: DC
  Administered 2023-04-18 (×2): 800 mg via ORAL
  Filled 2023-04-18 (×2): qty 2

## 2023-04-18 MED ORDER — MORPHINE SULFATE (PF) 2 MG/ML IV SOLN
1.0000 mg | INTRAVENOUS | Status: DC | PRN
  Administered 2023-04-18 (×4): 4 mg via INTRAVENOUS
  Filled 2023-04-18 (×4): qty 2

## 2023-04-18 MED ORDER — METHOCARBAMOL 500 MG PO TABS
1000.0000 mg | ORAL_TABLET | Freq: Four times a day (QID) | ORAL | Status: DC | PRN
  Administered 2023-04-19: 1000 mg via ORAL
  Filled 2023-04-18: qty 2

## 2023-04-18 MED ORDER — ONDANSETRON HCL 4 MG/2ML IJ SOLN
4.0000 mg | Freq: Four times a day (QID) | INTRAMUSCULAR | Status: DC | PRN
  Administered 2023-04-18: 4 mg via INTRAVENOUS
  Filled 2023-04-18: qty 2

## 2023-04-18 MED ORDER — LACTATED RINGERS IV BOLUS: 1000.0000 mL | Freq: Three times a day (TID) | INTRAVENOUS | Status: DC | PRN

## 2023-04-18 MED ORDER — BISACODYL 10 MG RE SUPP: 10.0000 mg | Freq: Two times a day (BID) | RECTAL | Status: DC | PRN

## 2023-04-18 MED ORDER — IPRATROPIUM-ALBUTEROL 0.5-2.5 (3) MG/3ML IN SOLN
3.0000 mL | Freq: Four times a day (QID) | RESPIRATORY_TRACT | Status: DC
  Administered 2023-04-18: 3 mL via RESPIRATORY_TRACT
  Filled 2023-04-18: qty 3

## 2023-04-18 MED ORDER — MIDAZOLAM HCL 2 MG/2ML IJ SOLN
INTRAMUSCULAR | Status: AC | PRN
Start: 2023-04-18 — End: 2023-04-18
  Administered 2023-04-18 (×2): 1 mg via INTRAVENOUS

## 2023-04-18 MED ORDER — HYDROMORPHONE HCL 1 MG/ML IJ SOLN
0.5000 mg | INTRAMUSCULAR | Status: DC | PRN
  Administered 2023-04-20 – 2023-04-21 (×4): 1 mg via INTRAVENOUS
  Filled 2023-04-18 (×4): qty 1

## 2023-04-18 MED ORDER — SODIUM CHLORIDE 0.9 % IV SOLN
2.0000 g | Freq: Once | INTRAVENOUS | Status: AC
  Administered 2023-04-18: 2 g via INTRAVENOUS
  Filled 2023-04-18: qty 12.5

## 2023-04-18 MED ORDER — FENTANYL CITRATE (PF) 100 MCG/2ML IJ SOLN
INTRAMUSCULAR | Status: AC | PRN
  Administered 2023-04-18 (×2): 50 ug via INTRAVENOUS

## 2023-04-18 MED ORDER — ROSUVASTATIN CALCIUM 10 MG PO TABS: 10.0000 mg | ORAL_TABLET | Freq: Every day | ORAL | Status: DC

## 2023-04-18 MED ORDER — POTASSIUM CHLORIDE 10 MEQ/100ML IV SOLN
10.0000 meq | INTRAVENOUS | Status: AC
  Administered 2023-04-18 (×3): 10 meq via INTRAVENOUS
  Filled 2023-04-18 (×3): qty 100

## 2023-04-18 MED ORDER — PSYLLIUM 95 % PO PACK
1.0000 | PACK | Freq: Two times a day (BID) | ORAL | Status: DC
  Administered 2023-04-18 – 2023-04-19 (×2): 1 via ORAL
  Filled 2023-04-18 (×11): qty 1

## 2023-04-18 MED ORDER — LIDOCAINE HCL 1 % IJ SOLN
INTRAMUSCULAR | Status: AC
  Filled 2023-04-18: qty 20

## 2023-04-18 MED ORDER — MIDAZOLAM HCL 2 MG/2ML IJ SOLN
INTRAMUSCULAR | Status: AC
  Filled 2023-04-18: qty 2

## 2023-04-18 MED ORDER — SODIUM CHLORIDE 0.9% FLUSH
5.0000 mL | Freq: Three times a day (TID) | INTRAVENOUS | Status: DC
  Administered 2023-04-18 – 2023-04-23 (×16): 5 mL

## 2023-04-18 MED ORDER — SODIUM CHLORIDE 0.9 % IV SOLN
2.0000 g | Freq: Three times a day (TID) | INTRAVENOUS | Status: DC
  Administered 2023-04-18 – 2023-04-19 (×3): 2 g via INTRAVENOUS
  Filled 2023-04-18 (×2): qty 12.5

## 2023-04-18 MED ORDER — ONDANSETRON 4 MG PO TBDP: 4.0000 mg | ORAL_TABLET | Freq: Four times a day (QID) | ORAL | Status: DC | PRN

## 2023-04-18 MED ORDER — FENTANYL CITRATE (PF) 100 MCG/2ML IJ SOLN
INTRAMUSCULAR | Status: AC
  Filled 2023-04-18: qty 2

## 2023-04-18 MED ORDER — DIPHENHYDRAMINE HCL 25 MG PO CAPS
25.0000 mg | ORAL_CAPSULE | Freq: Four times a day (QID) | ORAL | Status: DC | PRN
  Administered 2023-04-19: 25 mg via ORAL
  Filled 2023-04-18 (×2): qty 1

## 2023-04-18 MED ORDER — SODIUM CHLORIDE 0.9 % IV SOLN
2.0000 g | Freq: Three times a day (TID) | INTRAVENOUS | Status: DC
  Filled 2023-04-18: qty 12.5

## 2023-04-18 MED ORDER — MAGNESIUM SULFATE 2 GM/50ML IV SOLN
2.0000 g | Freq: Once | INTRAVENOUS | Status: AC
  Administered 2023-04-18: 2 g via INTRAVENOUS
  Filled 2023-04-18: qty 50

## 2023-04-18 MED ORDER — BISMUTH SUBSALICYLATE 262 MG/15ML PO SUSP
30.0000 mL | Freq: Three times a day (TID) | ORAL | Status: DC | PRN
  Filled 2023-04-18: qty 236

## 2023-04-18 MED ORDER — VANCOMYCIN HCL 1250 MG/250ML IV SOLN
1250.0000 mg | INTRAVENOUS | Status: DC
  Administered 2023-04-18: 1250 mg via INTRAVENOUS
  Filled 2023-04-18: qty 250

## 2023-04-18 MED ORDER — SODIUM CHLORIDE 0.9 % IV SOLN: 8.0000 mg | Freq: Four times a day (QID) | INTRAVENOUS | Status: DC | PRN

## 2023-04-18 MED ORDER — SODIUM CHLORIDE 0.9 % IV SOLN: 2.0000 g | Freq: Three times a day (TID) | INTRAVENOUS | Status: DC

## 2023-04-18 MED ORDER — ONDANSETRON HCL 4 MG/2ML IJ SOLN
4.0000 mg | Freq: Four times a day (QID) | INTRAMUSCULAR | Status: DC | PRN
  Administered 2023-04-23: 4 mg via INTRAVENOUS
  Filled 2023-04-18: qty 2

## 2023-04-18 MED ORDER — MENTHOL 3 MG MT LOZG: 1.0000 | LOZENGE | OROMUCOSAL | Status: DC | PRN

## 2023-04-18 MED ORDER — NAPHAZOLINE-GLYCERIN 0.012-0.25 % OP SOLN: 1.0000 [drp] | Freq: Four times a day (QID) | OPHTHALMIC | Status: DC | PRN

## 2023-04-18 MED ORDER — IPRATROPIUM-ALBUTEROL 0.5-2.5 (3) MG/3ML IN SOLN
3.0000 mL | Freq: Three times a day (TID) | RESPIRATORY_TRACT | Status: DC
  Administered 2023-04-18: 3 mL via RESPIRATORY_TRACT
  Filled 2023-04-18 (×2): qty 3

## 2023-04-18 MED ORDER — PANTOPRAZOLE SODIUM 40 MG IV SOLR
40.0000 mg | Freq: Every day | INTRAVENOUS | Status: DC
  Administered 2023-04-18 (×2): 40 mg via INTRAVENOUS
  Filled 2023-04-18 (×2): qty 10

## 2023-04-18 NOTE — Procedures (Signed)
Interventional Radiology Procedure:   Indications: Large post operative subcutaneous fluid collection in the abdomen.  Procedure: US guided placement of bilateral subcutaneous abdominal drains  Findings: Large complex subcutaneous collection(s) in anterior abdomen.  Placed bilateral 12 Fr drains.  Removed 900 ml of serosanguinous fluid.  Residual fluid seen on [place] at end of procedure.   Complications: None     EBL: Minimal  Plan: Send fluid for culture.  Follow drain outputs.   Charlton Boule R. Lowella Dandy, MD  Pager: [phone]

## 2023-04-18 NOTE — Progress Notes (Signed)
Finding out pt admitted Elevated WBC See if IR can drain abd wall seromas to r/o abscess or at least control postop fluid

## 2023-04-18 NOTE — Consult Note (Signed)
Chief Complaint: Patient was seen in consultation today for  Chief Complaint  Patient presents with   Post-op Problem   Referring Physician(s): Dr. Michaell Cowing   Supervising Physician: Richarda Overlie  Patient Status: [facility] - In-pt  History of Present Illness: Jacqueline Mora is a 67 y.o. 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   On 04/04/23 she underwent laparoscopic repair of abdominal hernia with mesh and panniculectomy. She tolerated the procedure well and was discharged home 04/09/23. She presented to the ED 04/17/23 with fevers, chills and general malaise. She was found to be febrile with leukocytosis and imaging showing an abdominal wall fluid collection.   Interventional Radiology has been asked to evaluate this patient for an image-guided abdominal wall fluid collection aspiration with possible drain placement. Imaging reviewed and procedure approved by Dr. Lowella Dandy.   Past Medical History:  Diagnosis Date   Abdominal hernia    Anemia    Anxiety    BMI 45.0-49.9, adult (HCC)    Cancer (HCC)    Colon cancer (HCC)    COVID-19    Depression    GERD (gastroesophageal reflux disease)    History of colon polyps    Hypercholesteremia    Hypertension    occ   Hypothyroidism    IBS (irritable bowel syndrome)    Mitral valve problem    Petechiae 04/04/2023   red patches after ankle   PONV (postoperative nausea and vomiting)    Prediabetes    Squamous cell carcinoma in situ (SCCIS) of skin    Thyroid disease    Vitamin D deficiency     Past Surgical History:  Procedure Laterality Date   ABDOMINAL HYSTERECTOMY     BLADDER NECK SUSPENSION     CESAREAN SECTION     x 2   colon cancer     COLONOSCOPY     HERNIA REPAIR     Mesh placed then later removed due to infection.   laparostomy     VENTRAL HERNIA REPAIR  N/A 04/04/2023   Procedure: LAPAROSCOPIC VENTRAL WALL HERNIA WITH LYSIS OF ADHESIONS AND PANNICULECTOMY;  Surgeon: Karie Soda, MD;  Location: [facility];  Service: General;  Laterality: N/A;    Allergies: Amlodipine besylate, Eszopiclone, Tape, Zocor [simvastatin], Clindamycin hcl, Meloxicam, Methylprednisolone, and Penicillin g  Medications: Prior to Admission medications   Medication Sig Start Date End Date Taking? Authorizing Provider  ALPRAZolam Prudy Feeler) 0.5 MG tablet Take 0.5 mg by mouth as needed for anxiety.   Yes [provider]  cholecalciferol (VITAMIN D) 1000 units tablet Take 1,000 Units by mouth 4 (four) times a week.   Yes [provider]  Cyanocobalamin (B-12 PO) Take 1 tablet by mouth 4 (four) times a week.   Yes [provider]  estradiol (ESTRACE) 1 MG tablet Take 1 mg by mouth daily.   Yes [provider]  ezetimibe (ZETIA) 10 MG tablet Take 10 mg by mouth daily. 02/25/23  Yes [provider]  FLUoxetine (PROZAC) 10 MG capsule Take 20 mg by mouth daily.   Yes [provider]  fluticasone (FLONASE) 50 MCG/ACT nasal spray Place 1 spray into both nostrils daily as needed for allergies. 12/12/16  Yes [provider]  furosemide (LASIX) 40 MG tablet TAKE 1 TABLET(40 MG TOTAL) BY MOUTH DAILY 07/09/22  Yes Chandrasekhar, Mahesh A, MD  ketoconazole (NIZORAL) 2 % cream  Apply 1 Application topically daily. 08/22/21  Yes [provider]  levothyroxine (SYNTHROID) 150 MCG tablet Take 150 mcg by mouth daily before breakfast.   Yes [provider]  losartan (COZAAR) 100 MG tablet Take 100 mg by mouth daily. 12/02/20  Yes [provider]  methocarbamol (ROBAXIN) 500 MG tablet Take 1-2 tablets (500-1,000 mg total) by mouth every 8 (eight) hours as needed for muscle spasms. 04/09/23  Yes Karie Soda, MD  ondansetron (ZOFRAN-ODT) 4 MG disintegrating tablet Take 4 mg by mouth every 8 (eight) hours as needed for  nausea or vomiting. 04/15/23 04/22/23 Yes [provider]  oxyCODONE 10 MG TABS Take 1-1.5 tablets (10-15 mg total) by mouth every 6 (six) hours as needed for severe pain or breakthrough pain. 04/09/23  Yes Karie Soda, MD  RABEprazole (ACIPHEX) 20 MG tablet Take 20 mg by mouth daily.   Yes [provider]  spironolactone (ALDACTONE) 25 MG tablet TAKE ONE TABLET BY MOUTH DAILY DOSE INCREASE 10/08/22  Yes Chandrasekhar, Mahesh A, MD  rosuvastatin (CRESTOR) 10 MG tablet Take 1 tablet (10 mg total) by mouth daily. Patient not taking: Reported on 04/18/2023 02/20/23 05/21/23  Riley Lam A, MD  WEGOVY 0.25 MG/0.5ML SOAJ Inject 0.25 mg into the skin once a week. Patient not taking: Reported on 03/15/2023 02/20/23   Christell Constant, MD     Family History  Problem Relation Age of Onset   Kidney cancer Mother    Diabetes Father     Social History   Socioeconomic History   Marital status: Widowed    Spouse name: Not on file   Number of children: 2   Years of education: 4 yrs college   Highest education level: Not on file  Occupational History   Occupation: Retired  Tobacco Use   Smoking status: Former    Types: Cigarettes   Smokeless tobacco: Never   Tobacco comments:    Quit 1984  Vaping Use   Vaping status: Never Used  Substance and Sexual Activity   Alcohol use: Not Currently    Comment: occasionally   Drug use: No   Sexual activity: Not on file  Other Topics Concern   Not on file  Social History Narrative   Lives at home with husband.   Right-handed.   4-5 cups unsweet tea per week.   Social Determinants of Health   Financial Resource Strain: Not on file  Food Insecurity: No Food Insecurity (04/18/2023)   Hunger Vital Sign    Worried About Running Out of Food in the Last Year: Never true    Ran Out of Food in the Last Year: Never true  Transportation Needs: No Transportation Needs (04/18/2023)   PRAPARE - Scientist, research (physical sciences) (Medical): No    Lack of Transportation (Non-Medical): No  Physical Activity: Not on file  Stress: Not on file  Social Connections: Not on file    Review of Systems: A 12 point ROS discussed and pertinent positives are indicated in the HPI above.  All other systems are negative.  Review of Systems  Constitutional:  Positive for appetite change, chills, fatigue and fever.  Respiratory:  Positive for cough and shortness of breath.   Cardiovascular:  Negative for chest pain and leg swelling.  Gastrointestinal:  Positive for abdominal pain, nausea and vomiting.  Neurological:  Positive for dizziness and headaches.    Vital Signs: BP (!) 107/54 (BP Location: Left Arm)   Pulse 89   Temp  98.6 F (37 C) (Oral)   Resp 18   Ht 5' 5.5" (1.664 m)   Wt 280 lb (127 kg)   SpO2 96%   BMI 45.89 kg/m   Physical Exam Constitutional:      General: She is not in acute distress.    Appearance: She is obese.  HENT:     Mouth/Throat:     Mouth: Mucous membranes are moist.     Pharynx: Oropharynx is clear.  Cardiovascular:     Rate and Rhythm: Normal rate and regular rhythm.     Pulses: Normal pulses.     Heart sounds: Normal heart sounds.  Pulmonary:     Effort: Pulmonary effort is normal.     Breath sounds: Normal breath sounds.  Abdominal:     General: Bowel sounds are normal.     Tenderness: There is abdominal tenderness.     Comments: Panniculectomy incision. No drainage, incision is well-approximated. Erythema in scattered areas along the incision line. Area is tender and firm in some places. Several abdominal laparoscopic incisions covered with steri strips.   Skin:    General: Skin is warm and dry.  Neurological:     Mental Status: She is alert and oriented to person, place, and time.  Psychiatric:        Mood and Affect: Mood normal.        Behavior: Behavior normal.        Thought Content: Thought content normal.        Judgment: Judgment normal.      Imaging: CT ABDOMEN PELVIS W CONTRAST  Result Date: 04/17/2023 CLINICAL DATA:  Ventral hernia open repair 2 weeks ago with mesh replacement, lower abdominal pain EXAM: CT ABDOMEN AND PELVIS WITH CONTRAST TECHNIQUE: Multidetector CT imaging of the abdomen and pelvis was performed using the standard protocol following bolus administration of intravenous contrast. RADIATION DOSE REDUCTION: This exam was performed according to the departmental dose-optimization program which includes automated exposure control, adjustment of the mA and/or kV according to patient size and/or use of iterative reconstruction technique. CONTRAST:  OMNIPAQUE IOHEXOL 300 MG/ML  SOLN COMPARISON:  01/04/2023 FINDINGS: Lower chest: Small type 1 hiatal hernia. Hepatobiliary: Unremarkable Pancreas: Unremarkable Spleen: Unremarkable Adrenals/Urinary Tract: Unremarkable Stomach/Bowel: Mast Modic staple line in the right small bowel (image 56, series 2) and in the rectum (image 74 series 2.). Normal appendix. No dilated bowel were substantially abnormal thick-walled bowel. Vascular/Lymphatic: Unremarkable Reproductive: Uterus absent.  Adnexa unremarkable. Other: No ascites. Musculoskeletal: The dominant complex ventral hernia has been repaired with mesh in place. There is edema and stranding along the mesh, not necessarily unexpected in this postoperative stage, there is also greater than expected nodularity in the omentum cephalad to the mesh for example on images 27 through 38 of series 2. Some of this may be from omental edema but the possibility of omental infarct or infectious process along the omentum is not totally excluded. Do not see any gas to indicate an abscess in this region. There were two previous upper abdominal hernias containing adipose tissue, the most cephalad has mildly enlarged with herniated adipose tissue currently measuring 4.4 by 2.0 by 2.9 cm. The more caudad, which is probably covered by the mesh, now  presents as small lobulations of fluid density in the subcutaneous tissues at the site of prior hernia for example on images 41 through 48 of series 4. I am uncertain if this represents residual hernia sac containing fluid versus simply some subcutaneous nodularity. There is  a large tubular fluid density process along the pannus along with reduction in volume of pannus, compatible with panniculectomy. Surrounding subcutaneous stranding is present. Miniscule loculation of gas along the right margin of this panniculectomy site on image 52 series 2, much less than 1 cc, possibly incidental. I do not see abnormal enhancement along the margins of the panniculectomy site to indicate an abscess. Correlate with quality of drainage at the wound site, if any. Lower lumbar spondylosis and degenerative disc disease causing right foraminal impingement at L4-5 and left foraminal impingement at L5-S1. Mildly transitional S1 vertebra. IMPRESSION: 1. Interval panniculectomy with a large tubular fluid density process along the pannus, but no rim enhancement to indicate abscess. Miniscule loculation of gas along the right margin of this panniculectomy site, much less than 1 cc, possibly incidental. Correlate with quality of drainage at the wound site, if any. 2. Interval repair of the dominant complex ventral hernia with mesh in place. There is edema and stranding along the mesh, not necessarily unexpected in this postoperative stage, but there is also greater than expected nodularity in the omentum cephalad to the mesh. Some of this may be from omental edema but the possibility of omental infarct or infectious process along the upper omentum is not totally excluded. 3. There were two previous upper abdominal hernias containing adipose tissue, the most cephalad has mildly enlarged and at the site of the more caudad there is lobular fluid density in the subcutaneous tissues. 4. Small type 1 hiatal hernia. 5. Lower lumbar spondylosis  and degenerative disc disease causing right foraminal impingement at L4-5 and left foraminal impingement at L5-S1. Electronically Signed   By: Gaylyn Rong M.D.   On: 04/17/2023 21:26   DG Chest Port 1 View  Result Date: 04/17/2023 CLINICAL DATA:  Sepsis, recent hernia repair EXAM: PORTABLE CHEST 1 VIEW COMPARISON:  04/06/2023 FINDINGS: Single frontal view of the chest demonstrates an unremarkable cardiac silhouette. Improved aeration at the lung bases. No airspace disease, effusion, or pneumothorax. No acute bony abnormalities. IMPRESSION: 1. No acute intrathoracic process. Electronically Signed   By: Sharlet Salina M.D.   On: 04/17/2023 20:07   VAS US RENAL ARTERY DUPLEX  Result Date: 04/07/2023 ABDOMINAL VISCERAL Patient Name:  Jacqueline Mora  Date of Exam:   04/06/2023 Medical Rec #: [id number]        Accession #:    [id number] Date of Birth: Mar 16, 1956       Patient Gender: F Patient Age:   65 years Exam Location:  [facility] Procedure:      VAS US RENAL ARTERY DUPLEX Referring Phys: [id number] Chevis Pretty III -------------------------------------------------------------------------------- Indications: "Renal insufficiency" High Risk Factors: Hypertension, hyperlipidemia, past history of smoking. Limitations: Obesity, patient discomfort and air/bowel gas, recent abdominal hernia surgery. Comparison Study: No previous exams Performing Technologist: Jody Hill RVT, RDMS  Examination Guidelines: A complete evaluation includes B-mode imaging, spectral Doppler, color Doppler, and power Doppler as needed of all accessible portions of each vessel. Bilateral testing is considered an integral part of a complete examination. Limited examinations for reoccurring indications may be performed as noted.  Duplex Findings: +----------+--------+--------+------+--------+ MesentericPSV cm/sEDV cm/sPlaqueComments +----------+--------+--------+------+--------+ Aorta Mid    91      13                   +----------+--------+--------+------+--------+    +------------------+--------+--------+-------+ Right Renal ArteryPSV cm/sEDV cm/sComment +------------------+--------+--------+-------+ Distal              121  20           +------------------+--------+--------+-------+ +-----------------+--------+--------+-------+ Left Renal ArteryPSV cm/sEDV cm/sComment +-----------------+--------+--------+-------+ Proximal           139      29           +-----------------+--------+--------+-------+ Mid                151      22           +-----------------+--------+--------+-------+ Distal              81      16           +-----------------+--------+--------+-------+  Technologist observations: VERY limited study due to habitus & recent abdominal hernia surgery. +------------+--------+--------+----+-----------+--------+--------+----+ Right KidneyPSV cm/sEDV cm/sRI  Left KidneyPSV cm/sEDV cm/sRI   +------------+--------+--------+----+-----------+--------+--------+----+ Upper Pole  30      5       0.83Upper Pole 29      7       0.77 +------------+--------+--------+----+-----------+--------+--------+----+ Mid         51      10      0.        57      14      0.76 +------------+--------+--------+----+-----------+--------+--------+----+ Lower Pole  24      6       0.75Lower Pole 58      10      0.83 +------------+--------+--------+----+-----------+--------+--------+----+ Hilar       53      10      0.81Hilar      58      10      0.82 +------------+--------+--------+----+-----------+--------+--------+----+ +------------------+---------+------------------+---------+ Right Kidney               Left Kidney                 +------------------+---------+------------------+---------+ RAR                        RAR                         +------------------+---------+------------------+---------+ RAR (manual)      1.33     RAR (manual)       1.66      +------------------+---------+------------------+---------+ Cortex            30/7 0.77Cortex            34/7 0.78 +------------------+---------+------------------+---------+ Cortex thickness           Corex thickness             +------------------+---------+------------------+---------+ Kidney length (cm)10.00    Kidney length (cm)10.10     +------------------+---------+------------------+---------+  Summary: Renal:  Right: No evidence of right renal artery stenosis, however only able        to visualize distal RRA. Abnormal right Resistive Index.        Normal size right kidney. RRV flow present. Left:  No evidence of left renal artery stenosis. Abnormal left        Resisitve Index. Normal size of left kidney. LRV flow        present. Mesenteric:  Celiac artery and SMA not visualized on this exam.  *See table(s) above for measurements and observations.  Diagnosing physician: Lemar Livings MD  Electronically signed by Lemar Livings MD on 04/07/2023 at 1:50:39 PM.    Final    DG Chest 1 View  Result Date:  04/06/2023 CLINICAL DATA:  Fluid overload.  Shortness of breath, hypertension. EXAM: CHEST  1 VIEW COMPARISON:  11/02/2004. FINDINGS: The heart size and mediastinal contours are within normal limits. Lung volumes are low. Strandy opacities are present at the lung bases. No definite effusion or pneumothorax. The visualized skeletal structures are unremarkable. IMPRESSION: Strandy opacities at the lung bases, possible atelectasis or infiltrate. Electronically Signed   By: Thornell Sartorius M.D.   On: 04/06/2023 02:48    Labs:  CBC: Recent Labs    04/08/23 0554 04/09/23 0445 04/17/23 1942 04/18/23 0343  WBC 8.9 7.1 13.6* 17.2*  HGB 9.1* 9.4* 11.2* 10.0*  HCT 29.2* 29.6* 35.0* 32.7*  PLT 249 271 330 305    COAGS: Recent Labs    03/27/23 1323 04/17/23 1942  INR 1.0 1.0  APTT 27 31    BMP: Recent Labs    04/05/23 0700 04/06/23 0444 04/07/23 0448 04/09/23 0445  04/17/23 1942 04/18/23 0343  NA 131*  --  134*  --  132* 135  K 4.5   < > 4.1 3.9 2.8* 3.1*  CL 102  --  105  --  92* 96*  CO2 17*  --  20*  --  26 25  GLUCOSE 170*  --  96  --  119* 120*  BUN 19  --  22  --  11 11  CALCIUM 7.7*  --  8.2*  --  8.3* 7.9*  CREATININE 2.44*   < > 1.63* 0.83 0.98 1.24*  GFRNONAA 21*   < > 34* >60 >60 48*   < > = values in this interval not displayed.    LIVER FUNCTION TESTS: Recent Labs    04/17/23 1942 04/18/23 0343  BILITOT 1.1 1.0  AST 22 18  ALT 17 14  ALKPHOS 88 68  PROT 6.7 5.4*  ALBUMIN 3.5 2.8*    TUMOR MARKERS: No results for input(s): "AFPTM", "CEA", "CA199", "CHROMGRNA" in the last 8760 hours.  Assessment and Plan:  Recent hernia repair and panniculectomy; post-op abdominal wall fluid collection: Jacqueline Mora, 67 year old female, is scheduled today for an image-guided abdominal wall fluid collection aspiration with possible drain placement.   Risks and benefits discussed with the patient including bleeding, infection, damage to adjacent structures, bowel perforation/fistula connection, and sepsis.  All of the patient's questions were answered, patient is agreeable to proceed. She has been NPO. Last dose of Lovenox was 1114 today.  Consent signed and in chart.   Thank you for this interesting consult.  I greatly enjoyed meeting Jacqueline Mora and look forward to participating in their care.  A copy of this report was sent to the requesting provider on this date.  Electronically Signed: Alwyn Ren, AGACNP-BC [phone] 04/18/2023, 1:00 PM   I spent a total of 20 Minutes    in face to face in clinical consultation, greater than 50% of which was counseling/coordinating care for abdominal wall fluid collection aspiration with possible drain placement.

## 2023-04-18 NOTE — Consult Note (Signed)
[facility] Initial Consultation Note  LESTINE WORTMANN ZOX:[id number] DOB: Feb 10, 1956 DOA: 04/17/2023  PCP: Jarrett Soho, PA-C   Requesting Physician: Dr. Romie Levee, general surgery  Reason for Consultation: Fever  HPI: Jacqueline Mora is a 67 y.o. female with medical history significant for morbid obesity, hypertension, hyperlipidemia, recurrent ventral incisional and incarcerated hernia who underwent complicated laparoscopic repair of abdominal hernia with mesh and panniculectomy with Dr. Jeannett Senior gross on 04/04/2023.  She was in the hospital for 5 days, discharged home on 9/20 in stable condition and returns to the hospital with fever.  Patient tells me that she was ambulatory quickly postoperatively, was doing well, tolerating a diet, and able to perform her ADLs.  Yesterday, she had sudden onset of chill, checked her temperature and it went up to 103 F.  She spoke with Dr. Carolynne Edouard general surgeon on-call, recommended presentation to the emergency department for evaluation.  On further discussion with the patient, she denies any significant cough, shortness of breath, nausea, vomiting.  She did have severe body aches which started suddenly yesterday, in which continue.  Denies any urgency frequency or dysuria.  Does state that she developed painful swelling in her bilateral lower extremities after she got home, but this has started to get better since she has been elevating her legs.  She was evaluated the ER with lab work, imaging including chest x-ray that were unremarkable, she was given empiric IV antibiotics after blood cultures were obtained, and she was admitted to the general surgery service.  On initial arrival, she had a temperature of 103.4, she was tachycardic until this morning.  Blood pressure has been stable.  She was placed on 2 L nasal cannula oxygen which she is on currently, though there is no documented hypoxia.  Lab work was personally reviewed, shows initial  leukocytosis of 14,000 yesterday, this morning this has risen to 17,000.  Lactate 1.5, she has some AKI creatinine 1.24 from baseline 0.98, potassium is low at 3.1 magnesium is low at 1.6.  Urinalysis has been ordered but patient has not given a sample yet.  COVID swab was negative yesterday.  She had CT of the abdomen and pelvis, which showed large tubular fluid density without evidence of abscess at the panniculectomy site.  This morning, hospitalist consultation was requested for further workup of her fever.  Review of Systems: Please see HPI for pertinent positives and negatives. A complete 10 system review of systems are otherwise negative.  Past Medical History:  Diagnosis Date   Abdominal hernia    Anemia    Anxiety    BMI 45.0-49.9, adult (HCC)    Cancer (HCC)    Colon cancer (HCC)    COVID-19    Depression    GERD (gastroesophageal reflux disease)    History of colon polyps    Hypercholesteremia    Hypertension    occ   Hypothyroidism    IBS (irritable bowel syndrome)    Mitral valve problem    Petechiae 04/04/2023   red patches after ankle   PONV (postoperative nausea and vomiting)    Prediabetes    Squamous cell carcinoma in situ (SCCIS) of skin    Thyroid disease    Vitamin D deficiency    Past Surgical History:  Procedure Laterality Date   ABDOMINAL HYSTERECTOMY     BLADDER NECK SUSPENSION     CESAREAN SECTION     x 2   colon cancer     COLONOSCOPY  HERNIA REPAIR     Mesh placed then later removed due to infection.   laparostomy     VENTRAL HERNIA REPAIR N/A 04/04/2023   Procedure: LAPAROSCOPIC VENTRAL WALL HERNIA WITH LYSIS OF ADHESIONS AND PANNICULECTOMY;  Surgeon: Karie Soda, MD;  Location: [facility];  Service: General;  Laterality: N/A;    Social History:  reports that she has quit smoking. Her smoking use included cigarettes. She has never used smokeless tobacco. She reports that she does not currently use alcohol. She reports that she does not use  drugs.  Allergies  Allergen Reactions   Amlodipine Besylate Other (See Comments)    Dizziness   Eszopiclone     Other reaction(s): Crazy dreams   Tape     Adhesive on medical tape and glue burns skin - requires paper tape.   Zocor [Simvastatin]     myalgias   Clindamycin Hcl Rash   Meloxicam Rash   Methylprednisolone Rash   Penicillin G Rash    Family History  Problem Relation Age of Onset   Kidney cancer Mother    Diabetes Father      Prior to Admission medications   Medication Sig Start Date End Date Taking? Authorizing Provider  ALPRAZolam Prudy Feeler) 0.5 MG tablet Take 0.5 mg by mouth as needed for anxiety.   Yes [provider]  cholecalciferol (VITAMIN D) 1000 units tablet Take 1,000 Units by mouth 4 (four) times a week.   Yes [provider]  Cyanocobalamin (B-12 PO) Take 1 tablet by mouth 4 (four) times a week.   Yes [provider]  estradiol (ESTRACE) 1 MG tablet Take 1 mg by mouth daily.   Yes [provider]  ezetimibe (ZETIA) 10 MG tablet Take 10 mg by mouth daily. 02/25/23  Yes [provider]  FLUoxetine (PROZAC) 10 MG capsule Take 20 mg by mouth daily.   Yes [provider]  fluticasone (FLONASE) 50 MCG/ACT nasal spray Place 1 spray into both nostrils daily as needed for allergies. 12/12/16  Yes [provider]  furosemide (LASIX) 40 MG tablet TAKE 1 TABLET(40 MG TOTAL) BY MOUTH DAILY 07/09/22  Yes Chandrasekhar, Mahesh A, MD  ketoconazole (NIZORAL) 2 % cream Apply 1 Application topically daily. 08/22/21  Yes [provider]  levothyroxine (SYNTHROID) 150 MCG tablet Take 150 mcg by mouth daily before breakfast.   Yes [provider]  losartan (COZAAR) 100 MG tablet Take 100 mg by mouth daily. 12/02/20  Yes [provider]  methocarbamol (ROBAXIN) 500 MG tablet Take 1-2 tablets (500-1,000 mg total) by mouth every 8 (eight) hours as needed for muscle spasms. 04/09/23  Yes Karie Soda,  MD  ondansetron (ZOFRAN-ODT) 4 MG disintegrating tablet Take 4 mg by mouth every 8 (eight) hours as needed for nausea or vomiting. 04/15/23 04/22/23 Yes [provider]  oxyCODONE 10 MG TABS Take 1-1.5 tablets (10-15 mg total) by mouth every 6 (six) hours as needed for severe pain or breakthrough pain. 04/09/23  Yes Karie Soda, MD  RABEprazole (ACIPHEX) 20 MG tablet Take 20 mg by mouth daily.   Yes [provider]  spironolactone (ALDACTONE) 25 MG tablet TAKE ONE TABLET BY MOUTH DAILY DOSE INCREASE 10/08/22  Yes Chandrasekhar, Mahesh A, MD  rosuvastatin (CRESTOR) 10 MG tablet Take 1 tablet (10 mg total) by mouth daily. Patient not taking: Reported on 04/18/2023 02/20/23 05/21/23  Riley Lam A, MD  WEGOVY 0.25 MG/0.5ML SOAJ Inject 0.25 mg into the skin once a week. Patient not taking:  Reported on 03/15/2023 02/20/23   Christell Constant, MD    Physical Exam: BP (!) 107/54 (BP Location: Left Arm)   Pulse 89   Temp 98.6 F (37 C) (Oral)   Resp 18   Ht 5' 5.5" (1.664 m)   Wt 127 kg   SpO2 96%   BMI 45.89 kg/m   General: Morbidly obese, looks comfortable, resting in her room with her daughter and friend at the bedside.  Currently wearing 2 L nasal cannula oxygen. Eyes: EOMI, clear conjuctivae, white sclerea Neck: supple, no masses, trachea mildline  Cardiovascular: RRR, no murmurs or rubs, no peripheral edema  Respiratory: clear to auscultation bilaterally, no wheezes, no crackles  Abdomen: soft, usual postsurgical changes, diffusely tender along incision site.  No obvious areas of fluctuance, there is mild associated erythema Skin: dry, no rashes  Musculoskeletal: no joint effusions, normal range of motion  Psychiatric: appropriate affect, normal speech  Neurologic: extraocular muscles intact, clear speech, moving all extremities with intact sensorium          Recent Labs and Imaging Reviewed:  Basic Metabolic Panel: Recent Labs  Lab 04/17/23 1942  04/18/23 0342 04/18/23 0343  NA 132*  --  135  K 2.8*  --  3.1*  CL 92*  --  96*  CO2 26  --  25  GLUCOSE 119*  --  120*  BUN 11  --  11  CREATININE 0.98  --  1.24*  CALCIUM 8.3*  --  7.9*  MG  --  1.6*  --    Liver Function Tests: Recent Labs  Lab 04/17/23 1942 04/18/23 0343  AST 22 18  ALT 17 14  ALKPHOS 88 68  BILITOT 1.1 1.0  PROT 6.7 5.4*  ALBUMIN 3.5 2.8*   No results for input(s): "LIPASE", "AMYLASE" in the last 168 hours. No results for input(s): "AMMONIA" in the last 168 hours. CBC: Recent Labs  Lab 04/17/23 1942 04/18/23 0343  WBC 13.6* 17.2*  NEUTROABS 12.0*  --   HGB 11.2* 10.0*  HCT 35.0* 32.7*  MCV 90.9 93.2  PLT 330 305   Cardiac Enzymes: No results for input(s): "CKTOTAL", "CKMB", "CKMBINDEX", "TROPONINI" in the last 168 hours.  BNP (last 3 results) No results for input(s): "BNP" in the last 8760 hours.  ProBNP (last 3 results) No results for input(s): "PROBNP" in the last 8760 hours.  CBG: No results for input(s): "GLUCAP" in the last 168 hours.  Radiological Exams on Admission: CT ABDOMEN PELVIS W CONTRAST  Result Date: 04/17/2023 CLINICAL DATA:  Ventral hernia open repair 2 weeks ago with mesh replacement, lower abdominal pain EXAM: CT ABDOMEN AND PELVIS WITH CONTRAST TECHNIQUE: Multidetector CT imaging of the abdomen and pelvis was performed using the standard protocol following bolus administration of intravenous contrast. RADIATION DOSE REDUCTION: This exam was performed according to the departmental dose-optimization program which includes automated exposure control, adjustment of the mA and/or kV according to patient size and/or use of iterative reconstruction technique. CONTRAST:  OMNIPAQUE IOHEXOL 300 MG/ML  SOLN COMPARISON:  01/04/2023 FINDINGS: Lower chest: Small type 1 hiatal hernia. Hepatobiliary: Unremarkable Pancreas: Unremarkable Spleen: Unremarkable Adrenals/Urinary Tract: Unremarkable Stomach/Bowel: Mast Modic staple line  in the right small bowel (image 56, series 2) and in the rectum (image 74 series 2.). Normal appendix. No dilated bowel were substantially abnormal thick-walled bowel. Vascular/Lymphatic: Unremarkable Reproductive: Uterus absent.  Adnexa unremarkable. Other: No ascites. Musculoskeletal: The dominant complex ventral hernia has been repaired with mesh in place. There is  edema and stranding along the mesh, not necessarily unexpected in this postoperative stage, there is also greater than expected nodularity in the omentum cephalad to the mesh for example on images 27 through 38 of series 2. Some of this may be from omental edema but the possibility of omental infarct or infectious process along the omentum is not totally excluded. Do not see any gas to indicate an abscess in this region. There were two previous upper abdominal hernias containing adipose tissue, the most cephalad has mildly enlarged with herniated adipose tissue currently measuring 4.4 by 2.0 by 2.9 cm. The more caudad, which is probably covered by the mesh, now presents as small lobulations of fluid density in the subcutaneous tissues at the site of prior hernia for example on images 41 through 48 of series 4. I am uncertain if this represents residual hernia sac containing fluid versus simply some subcutaneous nodularity. There is a large tubular fluid density process along the pannus along with reduction in volume of pannus, compatible with panniculectomy. Surrounding subcutaneous stranding is present. Miniscule loculation of gas along the right margin of this panniculectomy site on image 52 series 2, much less than 1 cc, possibly incidental. I do not see abnormal enhancement along the margins of the panniculectomy site to indicate an abscess. Correlate with quality of drainage at the wound site, if any. Lower lumbar spondylosis and degenerative disc disease causing right foraminal impingement at L4-5 and left foraminal impingement at L5-S1. Mildly  transitional S1 vertebra. IMPRESSION: 1. Interval panniculectomy with a large tubular fluid density process along the pannus, but no rim enhancement to indicate abscess. Miniscule loculation of gas along the right margin of this panniculectomy site, much less than 1 cc, possibly incidental. Correlate with quality of drainage at the wound site, if any. 2. Interval repair of the dominant complex ventral hernia with mesh in place. There is edema and stranding along the mesh, not necessarily unexpected in this postoperative stage, but there is also greater than expected nodularity in the omentum cephalad to the mesh. Some of this may be from omental edema but the possibility of omental infarct or infectious process along the upper omentum is not totally excluded. 3. There were two previous upper abdominal hernias containing adipose tissue, the most cephalad has mildly enlarged and at the site of the more caudad there is lobular fluid density in the subcutaneous tissues. 4. Small type 1 hiatal hernia. 5. Lower lumbar spondylosis and degenerative disc disease causing right foraminal impingement at L4-5 and left foraminal impingement at L5-S1. Electronically Signed   By: Gaylyn Rong M.D.   On: 04/17/2023 21:26   DG Chest Port 1 View  Result Date: 04/17/2023 CLINICAL DATA:  Sepsis, recent hernia repair EXAM: PORTABLE CHEST 1 VIEW COMPARISON:  04/06/2023 FINDINGS: Single frontal view of the chest demonstrates an unremarkable cardiac silhouette. Improved aeration at the lung bases. No airspace disease, effusion, or pneumothorax. No acute bony abnormalities. IMPRESSION: 1. No acute intrathoracic process. Electronically Signed   By: Sharlet Salina M.D.   On: 04/17/2023 20:07    Summary and Recommendations: This is a pleasant 67 year old female with a history of morbid obesity, hypertension, hyperlipidemia, GERD, prediabetes who underwent hernia repair with mesh as well as panniculectomy with Dr. Michaell Cowing on 8/15 was  admitted to the hospital 8/29 with fever.  Hospitalist consultation was requested for further workup of her fever.  Sepsis-patient meeting criteria for sepsis at the time of admission with leukocytosis, tachycardia, fever, endorgan dysfunction as manifested  by AKI.  Normal lactate.  She is hemodynamically stable. -Continue care per primary surgical team -Agree with continued empiric IV antibiotics, will broaden to IV cefepime and IV vancomycin -Source is unclear at this time, IR ultrasound drainage has been ordered -Follow-up urinalysis, blood cultures -Will obtain a viral respiratory panel -Given lower extremity swelling/pain and period of reduced ambulation postoperatively, will check bilateral lower extremity Dopplers to rule out DVT, which can cause fever  AKI-in the setting of normal renal function -Avoid nephrotoxins, hold losartan -Hydrate gently with normal saline -Follow renal function with daily labs  Otherwise continue chronic home medications for GERD, hypothyroidism, hyperlipidemia, as per primary team.  Thank you for involving [place] in the care of your patient.  [facility] will continue to follow along with you.    Code Status: Full Code  Time spent: 59 minutes  Fernado Brigante Sharlette Dense MD [facility] Pager [phone]  If 7PM-7AM, please contact night-coverage [email or web address] Password [email or web address]  04/18/2023, 1:15 PM

## 2023-04-18 NOTE — Progress Notes (Signed)
Note: Portions of this report may have been transcribed using voice recognition software. A sincere effort was made to ensure accuracy; however, inadvertent computerized transcription errors may be present.   Any transcriptional errors that result from this process are unintentional.        Jacqueline Mora  23-Aug-1955 [id number]  Patient Care Team: Jarrett Soho, PA-C as PCP - General (Family Medicine) Christell Constant, MD as PCP - Cardiology (Cardiology) Karie Soda, MD as Consulting Physician (General Surgery)  Patient status post drainage of large volume of serosanguineous fluid.  Not purulent.  Sitting up smiling.  Daughter and sister in room.  Tolerated solid food.  No more nausea or vomiting.  Had low-grade fever after drainage but feeling much better overall.  Worried about going home since she lives by herself.  Pannicular incision looks well-healed without any cellulitis.  Drains are serosanguineous.  She has no evidence of any abdominal wall infection or infection of her mesh or intra-abdominal infection.  Respiratory panel negative for COVID or other viral etiologies.  Appreciate internal medicine help to rule out other sources of infection since I do not think it is involving her abdomen or abdominal wall or surgical.    Patient Active Problem List   Diagnosis Date Noted   Hypokalemia 04/18/2023   Nausea & vomiting 04/18/2023   Abscess of abdominal wall 04/18/2023   Fever postop 04/17/2023   Acute blood loss anemia (ABLA) 04/09/2023   Hypothyroidism    Prediabetes    IBS (irritable bowel syndrome)    GERD (gastroesophageal reflux disease)    Anxiety    Recurrent incisional hernia 04/04/2023   Elevated coronary artery calcium score 02/20/2023   Personal history of colon cancer 01/28/2023   DOE (dyspnea on exertion) 12/08/2020   Morbid obesity (HCC) 12/08/2020   Essential hypertension 12/08/2020   Hyperlipidemia 12/08/2020   Chest pain of  uncertain etiology 12/08/2020   Muscle weakness (generalized) 03/20/2020   Malignant melanoma (HCC) 12/07/2019   Migraine 12/04/2019   Genital herpes simplex 12/04/2019   Constipation 09/02/2018   Dizziness 01/29/2018   Post-concussion syndrome 01/29/2018   Intractable post-traumatic headache 06/19/2017   Adjustment insomnia 06/19/2017   Sensitive skin 08/20/1998    Past Medical History:  Diagnosis Date   Abdominal hernia    Anemia    Anxiety    BMI 45.0-49.9, adult (HCC)    Cancer (HCC)    Colon cancer (HCC)    COVID-19    Depression    GERD (gastroesophageal reflux disease)    History of colon polyps    Hypercholesteremia    Hypertension    occ   Hypothyroidism    IBS (irritable bowel syndrome)    Mitral valve problem    Petechiae 04/04/2023   red patches after ankle   PONV (postoperative nausea and vomiting)    Prediabetes    Squamous cell carcinoma in situ (SCCIS) of skin    Thyroid disease    Vitamin D deficiency     Past Surgical History:  Procedure Laterality Date   ABDOMINAL HYSTERECTOMY     BLADDER NECK SUSPENSION     CESAREAN SECTION     x 2   colon cancer     COLONOSCOPY     HERNIA REPAIR     Mesh placed then later removed due to infection.   laparostomy     VENTRAL HERNIA REPAIR N/A 04/04/2023   Procedure: LAPAROSCOPIC VENTRAL WALL HERNIA WITH LYSIS OF ADHESIONS AND PANNICULECTOMY;  Surgeon: Michaell Cowing,  Viviann Spare, MD;  Location: [facility];  Service: General;  Laterality: N/A;    Social History   Socioeconomic History   Marital status: Widowed    Spouse name: Not on file   Number of children: 2   Years of education: 4 yrs college   Highest education level: Not on file  Occupational History   Occupation: Retired  Tobacco Use   Smoking status: Former    Types: Cigarettes   Smokeless tobacco: Never   Tobacco comments:    Quit 1984  Vaping Use   Vaping status: Never Used  Substance and Sexual Activity   Alcohol use: Not Currently    Comment:  occasionally   Drug use: No   Sexual activity: Not on file  Other Topics Concern   Not on file  Social History Narrative   Lives at home with husband.   Right-handed.   4-5 cups unsweet tea per week.   Social Determinants of Health   Financial Resource Strain: Not on file  Food Insecurity: No Food Insecurity (04/18/2023)   Hunger Vital Sign    Worried About Running Out of Food in the Last Year: Never true    Ran Out of Food in the Last Year: Never true  Transportation Needs: No Transportation Needs (04/18/2023)   PRAPARE - Administrator, Civil Service (Medical): No    Lack of Transportation (Non-Medical): No  Physical Activity: Not on file  Stress: Not on file  Social Connections: Not on file  Intimate Partner Violence: Not At Risk (04/18/2023)   Humiliation, Afraid, Rape, and Kick questionnaire    Fear of Current or Ex-Partner: No    Emotionally Abused: No    Physically Abused: No    Sexually Abused: No    Family History  Problem Relation Age of Onset   Kidney cancer Mother    Diabetes Father     Current Facility-Administered Medications  Medication Dose Route Frequency Provider Last Rate Last Admin   0.9 %  sodium chloride infusion   Intravenous Continuous Chevis Pretty III, MD 100 mL/hr at 04/18/23 1801 Infusion Verify at 04/18/23 1801   acetaminophen (TYLENOL) suppository 650 mg  650 mg Rectal Q6H PRN Karie Soda, MD       acetaminophen (TYLENOL) tablet 325-650 mg  325-650 mg Oral Q6H PRN Karie Soda, MD   650 mg at 04/18/23 1615   albuterol (PROVENTIL) (2.5 MG/3ML) 0.083% nebulizer solution 2.5 mg  2.5 mg Nebulization Q6H PRN Karie Soda, MD       alum & mag hydroxide-simeth (MAALOX/MYLANTA) 200-200-20 MG/5ML suspension 30 mL  30 mL Oral Q6H PRN Karie Soda, MD   30 mL at 04/18/23 1615   bisacodyl (DULCOLAX) suppository 10 mg  10 mg Rectal Q12H PRN Karie Soda, MD       bismuth subsalicylate (PEPTO BISMOL) 262 MG/15ML suspension 30 mL  30 mL Oral Q8H  PRN Karie Soda, MD       ceFEPIme (MAXIPIME) 2 g in sodium chloride 0.9 % 100 mL IVPB  2 g Intravenous Q8H Hessie Knows, [facility]   Stopped at 04/18/23 1426   diphenhydrAMINE (BENADRYL) capsule 25 mg  25 mg Oral Q6H PRN Karie Soda, MD       diphenhydrAMINE (BENADRYL) injection 12.5-25 mg  12.5-25 mg Intravenous Q6H PRN Karie Soda, MD       enoxaparin (LOVENOX) injection 30 mg  30 mg Subcutaneous Q24H Chevis Pretty III, MD   30 mg at 04/18/23 1114   ezetimibe (  ZETIA) tablet 10 mg  10 mg Oral Daily Chevis Pretty III, MD   10 mg at 04/18/23 4259   HYDROmorphone (DILAUDID) injection 0.5-2 mg  0.5-2 mg Intravenous Q2H PRN Karie Soda, MD       ibuprofen (ADVIL) tablet 800 mg  800 mg Oral QID Karie Soda, MD   800 mg at 04/18/23 1739   ipratropium-albuterol (DUONEB) 0.5-2.5 (3) MG/3ML nebulizer solution 3 mL  3 mL Nebulization TID Simaan, Elizabeth S, PA-C       lactated ringers bolus 1,000 mL  1,000 mL Intravenous Q8H PRN Karie Soda, MD       levothyroxine (SYNTHROID) tablet 150 mcg  150 mcg Oral Q0600 Chevis Pretty III, MD   150 mcg at 04/18/23 5638   magic mouthwash  15 mL Oral QID PRN Karie Soda, MD       menthol-cetylpyridinium (CEPACOL) lozenge 3 mg  1 lozenge Oral PRN Karie Soda, MD       methocarbamol (ROBAXIN) 1,000 mg in dextrose 5 % 100 mL IVPB  1,000 mg Intravenous Q6H PRN Karie Soda, MD       methocarbamol (ROBAXIN) tablet 1,000 mg  1,000 mg Oral Q6H PRN Karie Soda, MD       morphine (PF) 2 MG/ML injection 1-4 mg  1-4 mg Intravenous Q1H PRN Chevis Pretty III, MD   4 mg at 04/18/23 7564   naphazoline-glycerin (CLEAR EYES REDNESS) ophth solution 1-2 drop  1-2 drop Both Eyes QID PRN Karie Soda, MD       ondansetron [facility]) injection 4 mg  4 mg Intravenous Q6H PRN Karie Soda, MD       Or   ondansetron (ZOFRAN) 8 mg in sodium chloride 0.9 % 50 mL IVPB  8 mg Intravenous Q6H PRN Karie Soda, MD       ondansetron (ZOFRAN-ODT) disintegrating tablet 4 mg  4 mg Oral Q6H PRN  Chevis Pretty III, MD       pantoprazole (PROTONIX) injection 40 mg  40 mg Intravenous QHS Chevis Pretty III, MD   40 mg at 04/18/23 0058   phenol (CHLORASEPTIC) mouth spray 2 spray  2 spray Mouth/Throat PRN Karie Soda, MD       potassium chloride 10 mEq in 100 mL IVPB  10 mEq Intravenous Once Karie Soda, MD 100 mL/hr at 04/18/23 1801 Infusion Verify at 04/18/23 1801   potassium chloride SA (KLOR-CON M) CR tablet 40 mEq  40 mEq Oral BID Karie Soda, MD       prochlorperazine (COMPAZINE) injection 5-10 mg  5-10 mg Intravenous Q6H PRN Adam Phenix, PA-C       promethazine (PHENERGAN) 12.5 mg in sodium chloride 0.9 % 50 mL IVPB  12.5 mg Intravenous Q6H PRN Chevis Pretty III, MD   Stopped at 04/18/23 1254   psyllium (HYDROCIL/METAMUCIL) 1 packet  1 packet Oral BID Karie Soda, MD       simethicone (MYLICON) 40 MG/0.6ML suspension 80 mg  80 mg Oral QID PRN Karie Soda, MD       sodium chloride (OCEAN) 0.65 % nasal spray 1-2 spray  1-2 spray Each Nare Q6H PRN Karie Soda, MD       sodium chloride flush (NS) 0.9 % injection 5 mL  5 mL Intracatheter Q8H Richarda Overlie, MD   5 mL at 04/18/23 1741   vancomycin (VANCOREADY) IVPB 1250 mg/250 mL  1,250 mg Intravenous Q24H Hessie Knows, [facility]         Allergies  Allergen Reactions  Amlodipine Besylate Other (See Comments)    Dizziness   Eszopiclone     Other reaction(s): Crazy dreams   Tape     Adhesive on medical tape and glue burns skin - requires paper tape.   Zocor [Simvastatin]     myalgias   Clindamycin Hcl Rash   Meloxicam Rash   Methylprednisolone Rash   Penicillin G Rash    BP (!) 124/55 (BP Location: Left Arm)   Pulse 99   Temp 98.3 F (36.8 C) (Oral)   Resp 20   Ht 5' 5.5" (1.664 m)   Wt 127 kg   SpO2 98%   BMI 45.89 kg/m   [place] Abscess Drain  Result Date: 04/18/2023 INDICATION: 67 year old with large abdominal subcutaneous fluid collections following panniculectomy and repair of abdominal hernia. EXAM: 1. Placement of  percutaneous drain in the right abdominal subcutaneous fluid collection 2. Placement of percutaneous drain in the left abdominal subcutaneous fluid collection MEDICATIONS: Moderate sedation ANESTHESIA/SEDATION: Moderate (conscious) sedation was employed during this procedure. A total of Versed 2mg  and fentanyl 100 mcg was administered intravenously at the order of the provider performing the procedure. Total intra-service moderate sedation time: 40 minutes. Patient's level of consciousness and vital signs were monitored continuously by radiology nurse throughout the procedure under the supervision of the provider performing the procedure. COMPLICATIONS: None immediate. PROCEDURE: Informed written consent was obtained from the patient after a thorough discussion of the procedural risks, benefits and alternatives. All questions were addressed. A timeout was performed prior to the initiation of the procedure. Both sides of the abdomen were obtained. Large subcutaneous fluid collections were identified near the surgical incisions on both sides of the abdomen. The left and right side of the abdomen were prepped with chlorhexidine and sterile field was created. Skin was anesthetized along the right side of the abdomen with 1% lidocaine and a small incision was made. Using ultrasound guidance, a Yueh catheter was directed into the large subcutaneous fluid collection and serosanguineous fluid was aspirated. Superstiff Amplatz wire was advanced into the collection. The tract was dilated to accommodate a 12 [place] drain. Large amount of serosanguineous fluid was removed. At one point, the drain needed to be flush to continue aspirating fluid. Drain was attached to a suction bulb. Ultrasound demonstrated a large amount of residual subcutaneous fluid along the left side of the abdomen. Therefore, attention was directed to the left side of the abdominal subcutaneous fluid collection. The left side of the abdomen was anesthetized  with 1% lidocaine and a small incision was made. Using ultrasound guidance, a Yueh catheter was directed into the subcutaneous fluid collection and serosanguineous fluid was aspirated. Superstiff Amplatz wire was advanced into the collection and the tract was dilated to accommodate a 12 [place] drain. Additional serosanguineous fluid was removed. Both drains were attached to suction bulbs. Sample sent for culture. Both drains were sutured to skin and sterile dressings were placed. FINDINGS: Large complex fluid collections in the anterior abdomen most prominent along the lateral aspects of the abdomen. Bilateral 12 French drains were placed. Greater than 900 mL of serosanguineous fluid was removed from these collections. The subcutaneous fluid collections were markedly decreased by the end of the procedure. IMPRESSION: Ultrasound-guided placement of two abdominal subcutaneous drainage catheters. Greater than 900 mL of serosanguineous fluid was removed from the abdominal subcutaneous collections. Fluid was sent for culture. Electronically Signed   By: Richarda Overlie M.D.   On: 04/18/2023 16:39   [place] Abscess Drain  Result Date:  04/18/2023 INDICATION: 67 year old with large abdominal subcutaneous fluid collections following panniculectomy and repair of abdominal hernia. EXAM: 1. Placement of percutaneous drain in the right abdominal subcutaneous fluid collection 2. Placement of percutaneous drain in the left abdominal subcutaneous fluid collection MEDICATIONS: Moderate sedation ANESTHESIA/SEDATION: Moderate (conscious) sedation was employed during this procedure. A total of Versed 2mg  and fentanyl 100 mcg was administered intravenously at the order of the provider performing the procedure. Total intra-service moderate sedation time: 40 minutes. Patient's level of consciousness and vital signs were monitored continuously by radiology nurse throughout the procedure under the supervision of the provider performing the  procedure. COMPLICATIONS: None immediate. PROCEDURE: Informed written consent was obtained from the patient after a thorough discussion of the procedural risks, benefits and alternatives. All questions were addressed. A timeout was performed prior to the initiation of the procedure. Both sides of the abdomen were obtained. Large subcutaneous fluid collections were identified near the surgical incisions on both sides of the abdomen. The left and right side of the abdomen were prepped with chlorhexidine and sterile field was created. Skin was anesthetized along the right side of the abdomen with 1% lidocaine and a small incision was made. Using ultrasound guidance, a Yueh catheter was directed into the large subcutaneous fluid collection and serosanguineous fluid was aspirated. Superstiff Amplatz wire was advanced into the collection. The tract was dilated to accommodate a 12 [place] drain. Large amount of serosanguineous fluid was removed. At one point, the drain needed to be flush to continue aspirating fluid. Drain was attached to a suction bulb. Ultrasound demonstrated a large amount of residual subcutaneous fluid along the left side of the abdomen. Therefore, attention was directed to the left side of the abdominal subcutaneous fluid collection. The left side of the abdomen was anesthetized with 1% lidocaine and a small incision was made. Using ultrasound guidance, a Yueh catheter was directed into the subcutaneous fluid collection and serosanguineous fluid was aspirated. Superstiff Amplatz wire was advanced into the collection and the tract was dilated to accommodate a 12 [place] drain. Additional serosanguineous fluid was removed. Both drains were attached to suction bulbs. Sample sent for culture. Both drains were sutured to skin and sterile dressings were placed. FINDINGS: Large complex fluid collections in the anterior abdomen most prominent along the lateral aspects of the abdomen. Bilateral 12 French drains  were placed. Greater than 900 mL of serosanguineous fluid was removed from these collections. The subcutaneous fluid collections were markedly decreased by the end of the procedure. IMPRESSION: Ultrasound-guided placement of two abdominal subcutaneous drainage catheters. Greater than 900 mL of serosanguineous fluid was removed from the abdominal subcutaneous collections. Fluid was sent for culture. Electronically Signed   By: Richarda Overlie M.D.   On: 04/18/2023 16:39   CT ABDOMEN PELVIS W CONTRAST  Result Date: 04/17/2023 CLINICAL DATA:  Ventral hernia open repair 2 weeks ago with mesh replacement, lower abdominal pain EXAM: CT ABDOMEN AND PELVIS WITH CONTRAST TECHNIQUE: Multidetector CT imaging of the abdomen and pelvis was performed using the standard protocol following bolus administration of intravenous contrast. RADIATION DOSE REDUCTION: This exam was performed according to the departmental dose-optimization program which includes automated exposure control, adjustment of the mA and/or kV according to patient size and/or use of iterative reconstruction technique. CONTRAST:  OMNIPAQUE IOHEXOL 300 MG/ML  SOLN COMPARISON:  01/04/2023 FINDINGS: Lower chest: Small type 1 hiatal hernia. Hepatobiliary: Unremarkable Pancreas: Unremarkable Spleen: Unremarkable Adrenals/Urinary Tract: Unremarkable Stomach/Bowel: Mast Modic staple line in the right small bowel (image  56, series 2) and in the rectum (image 74 series 2.). Normal appendix. No dilated bowel were substantially abnormal thick-walled bowel. Vascular/Lymphatic: Unremarkable Reproductive: Uterus absent.  Adnexa unremarkable. Other: No ascites. Musculoskeletal: The dominant complex ventral hernia has been repaired with mesh in place. There is edema and stranding along the mesh, not necessarily unexpected in this postoperative stage, there is also greater than expected nodularity in the omentum cephalad to the mesh for example on images 27 through 38 of  series 2. Some of this may be from omental edema but the possibility of omental infarct or infectious process along the omentum is not totally excluded. Do not see any gas to indicate an abscess in this region. There were two previous upper abdominal hernias containing adipose tissue, the most cephalad has mildly enlarged with herniated adipose tissue currently measuring 4.4 by 2.0 by 2.9 cm. The more caudad, which is probably covered by the mesh, now presents as small lobulations of fluid density in the subcutaneous tissues at the site of prior hernia for example on images 41 through 48 of series 4. I am uncertain if this represents residual hernia sac containing fluid versus simply some subcutaneous nodularity. There is a large tubular fluid density process along the pannus along with reduction in volume of pannus, compatible with panniculectomy. Surrounding subcutaneous stranding is present. Miniscule loculation of gas along the right margin of this panniculectomy site on image 52 series 2, much less than 1 cc, possibly incidental. I do not see abnormal enhancement along the margins of the panniculectomy site to indicate an abscess. Correlate with quality of drainage at the wound site, if any. Lower lumbar spondylosis and degenerative disc disease causing right foraminal impingement at L4-5 and left foraminal impingement at L5-S1. Mildly transitional S1 vertebra. IMPRESSION: 1. Interval panniculectomy with a large tubular fluid density process along the pannus, but no rim enhancement to indicate abscess. Miniscule loculation of gas along the right margin of this panniculectomy site, much less than 1 cc, possibly incidental. Correlate with quality of drainage at the wound site, if any. 2. Interval repair of the dominant complex ventral hernia with mesh in place. There is edema and stranding along the mesh, not necessarily unexpected in this postoperative stage, but there is also greater than expected nodularity in  the omentum cephalad to the mesh. Some of this may be from omental edema but the possibility of omental infarct or infectious process along the upper omentum is not totally excluded. 3. There were two previous upper abdominal hernias containing adipose tissue, the most cephalad has mildly enlarged and at the site of the more caudad there is lobular fluid density in the subcutaneous tissues. 4. Small type 1 hiatal hernia. 5. Lower lumbar spondylosis and degenerative disc disease causing right foraminal impingement at L4-5 and left foraminal impingement at L5-S1. Electronically Signed   By: Gaylyn Rong M.D.   On: 04/17/2023 21:26   DG Chest Port 1 View  Result Date: 04/17/2023 CLINICAL DATA:  Sepsis, recent hernia repair EXAM: PORTABLE CHEST 1 VIEW COMPARISON:  04/06/2023 FINDINGS: Single frontal view of the chest demonstrates an unremarkable cardiac silhouette. Improved aeration at the lung bases. No airspace disease, effusion, or pneumothorax. No acute bony abnormalities. IMPRESSION: 1. No acute intrathoracic process. Electronically Signed   By: Sharlet Salina M.D.   On: 04/17/2023 20:07   VAS US RENAL ARTERY DUPLEX  Result Date: 04/07/2023 ABDOMINAL VISCERAL Patient Name:  Jacqueline Mora  Date of Exam:   04/06/2023 Medical Rec #:  [id number]        Accession #:    [id number] Date of Birth: [birth year]       Patient Gender: F Patient Age:   10 years Exam Location:  [facility] Procedure:      VAS US RENAL ARTERY DUPLEX Referring Phys: [id number] Chevis Pretty III -------------------------------------------------------------------------------- Indications: "Renal insufficiency" High Risk Factors: Hypertension, hyperlipidemia, past history of smoking. Limitations: Obesity, patient discomfort and air/bowel gas, recent abdominal hernia surgery. Comparison Study: No previous exams Performing Technologist: Jody Hill RVT, RDMS  Examination Guidelines: A complete evaluation includes B-mode imaging, spectral  Doppler, color Doppler, and power Doppler as needed of all accessible portions of each vessel. Bilateral testing is considered an integral part of a complete examination. Limited examinations for reoccurring indications may be performed as noted.  Duplex Findings: +----------+--------+--------+------+--------+ MesentericPSV cm/sEDV cm/sPlaqueComments +----------+--------+--------+------+--------+ Aorta Mid    91      13                  +----------+--------+--------+------+--------+    +------------------+--------+--------+-------+ Right Renal ArteryPSV cm/sEDV cm/sComment +------------------+--------+--------+-------+ Distal              121      20           +------------------+--------+--------+-------+ +-----------------+--------+--------+-------+ Left Renal ArteryPSV cm/sEDV cm/sComment +-----------------+--------+--------+-------+ Proximal           139      29           +-----------------+--------+--------+-------+ Mid                151      22           +-----------------+--------+--------+-------+ Distal              81      16           +-----------------+--------+--------+-------+  Technologist observations: VERY limited study due to habitus & recent abdominal hernia surgery. +------------+--------+--------+----+-----------+--------+--------+----+ Right KidneyPSV cm/sEDV cm/sRI  Left KidneyPSV cm/sEDV cm/sRI   +------------+--------+--------+----+-----------+--------+--------+----+ Upper Pole  30      5       0.83Upper Pole 29      7       0.77 +------------+--------+--------+----+-----------+--------+--------+----+ Mid         51      10      0.        57      14      0.76 +------------+--------+--------+----+-----------+--------+--------+----+ Lower Pole  24      6       0.75Lower Pole 58      10      0.83 +------------+--------+--------+----+-----------+--------+--------+----+ Hilar       53      10      0.81Hilar       58      10      0.82 +------------+--------+--------+----+-----------+--------+--------+----+ +------------------+---------+------------------+---------+ Right Kidney               Left Kidney                 +------------------+---------+------------------+---------+ RAR                        RAR                         +------------------+---------+------------------+---------+ RAR (manual)      1.33     RAR (manual)  1.66      +------------------+---------+------------------+---------+ Cortex            30/7 0.77Cortex            34/7 0.78 +------------------+---------+------------------+---------+ Cortex thickness           Corex thickness             +------------------+---------+------------------+---------+ Kidney length (cm)10.00    Kidney length (cm)10.10     +------------------+---------+------------------+---------+  Summary: Renal:  Right: No evidence of right renal artery stenosis, however only able        to visualize distal RRA. Abnormal right Resistive Index.        Normal size right kidney. RRV flow present. Left:  No evidence of left renal artery stenosis. Abnormal left        Resisitve Index. Normal size of left kidney. LRV flow        present. Mesenteric:  Celiac artery and SMA not visualized on this exam.  *See table(s) above for measurements and observations.  Diagnosing physician: Lemar Livings MD  Electronically signed by Lemar Livings MD on 04/07/2023 at 1:50:39 PM.    Final    DG Chest 1 View  Result Date: 04/06/2023 CLINICAL DATA:  Fluid overload.  Shortness of breath, hypertension. EXAM: CHEST  1 VIEW COMPARISON:  11/02/2004. FINDINGS: The heart size and mediastinal contours are within normal limits. Lung volumes are low. Strandy opacities are present at the lung bases. No definite effusion or pneumothorax. The visualized skeletal structures are unremarkable. IMPRESSION: Strandy opacities at the lung bases, possible atelectasis or infiltrate.  Electronically Signed   By: Thornell Sartorius M.D.   On: 04/06/2023 02:48

## 2023-04-18 NOTE — Progress Notes (Addendum)
Pharmacy Antibiotic Note  Jacqueline Mora is a 67 y.o. female admitted on 04/17/2023 with  wound infection .  Pharmacy has been consulted for vancomycin dosing.  Plan: Vanc 2g x 1 already given in ER, start vancomycin 1250mg  IV q24 - goal AUC 400-550 Ancef per Md  Height: 5' 5.5" (166.4 cm) Weight: 127 kg (280 lb) IBW/kg (Calculated) : 58.15  Temp (24hrs), Avg:99.8 F (37.7 C), Min:98.2 F (36.8 C), Max:103.1 F (39.5 C)  Recent Labs  Lab 04/17/23 1942 04/17/23 1955 04/18/23 0343  WBC 13.6*  --  17.2*  CREATININE 0.98  --  1.24*  LATICACIDVEN  --  1.5  --     Estimated Creatinine Clearance: 59.6 mL/min (A) (by C-G formula based on SCr of 1.24 mg/dL (H)).    Allergies  Allergen Reactions   Amlodipine Besylate Other (See Comments)    Dizziness   Eszopiclone     Other reaction(s): Crazy dreams   Tape     Adhesive on medical tape and glue burns skin - requires paper tape.   Zocor [Simvastatin]     myalgias   Clindamycin Hcl Rash   Meloxicam Rash   Methylprednisolone Rash   Penicillin G Rash      Thank you for allowing pharmacy to be a part of this patient's care.  Berkley Harvey 04/18/2023 12:29 PM

## 2023-04-18 NOTE — Plan of Care (Signed)

## 2023-04-18 NOTE — Progress Notes (Addendum)
[facility] Progress Note     Subjective: CC:  Patient tells me that, ever since surgery, she has been taking around the clock pain meds in order to mobilize at home. She has had constant nausea since surgery requiring anti-emetics. She attributes the nausea to the pain meds, is unsure if she gets nauseated when not taking pain meds. She also reports early satiety, eating 2-3 bites of food every few hours and getting full. Between meals she reports dry heaves and low volume bilious emesis, not throwing up food. She reports explosive, non-bloody, small-volume stools.   In the last 48 hours she reports chills, fever, and flu-like symptoms. She also reports increased SOB. She has some tenderness over her LLQ where she had some bloody drainage from her panniculectomy site.    Objective: Vital signs in last 24 hours: Temp:  [98.2 F (36.8 C)-103.1 F (39.5 C)] 98.6 F (37 C) (08/29 0939) Pulse Rate:  [89-124] 89 (08/29 0939) Resp:  [18-40] 18 (08/29 0939) BP: (106-183)/(52-91) 107/54 (08/29 0939) SpO2:  [88 %-100 %] 100 % (08/29 0939) Weight:  [409 kg] 127 kg (08/28 1925) Last BM Date : 04/17/23  Intake/Output from previous day: 08/28 0701 - 08/29 0700 In: 1206 [P.O.:150; I.V.:506; IV Piggyback:550] Out: 700 [Urine:700] Intake/Output this shift: Total I/O In: 561.5 [I.V.:411.5; IV Piggyback:150] Out: -   PE: Gen:  Alert, appears uncomfortable but non-toxic appearing Card:  Regular rate and rhythm, pedal pulses 2+ BL Pulm:  slightly labored on 2 L [place], wheezes present Abd: Soft, mild to moderate distention, there is mild TTP of the left side of per panniculectomy incision along with mild blanching erythema. Her abdomen is otherwise minimally tender and there is no rebound tenderness. Skin: warm and dry, no rashes  Psych: A&Ox3   Lab Results:  Recent Labs    04/17/23 1942 04/18/23 0343  WBC 13.6* 17.2*  HGB 11.2* 10.0*  HCT 35.0* 32.7*  PLT 330 305    BMET Recent Labs    04/17/23 1942 04/18/23 0343  NA 132* 135  K 2.8* 3.1*  CL 92* 96*  CO2 26 25  GLUCOSE 119* 120*  BUN 11 11  CREATININE 0.98 1.24*  CALCIUM 8.3* 7.9*   PT/INR Recent Labs    04/17/23 1942  LABPROT 13.4  INR 1.0   CMP     Component Value Date/Time   NA 135 04/18/2023 0343   NA 138 10/16/2021 1602   K 3.1 (L) 04/18/2023 0343   CL 96 (L) 04/18/2023 0343   CO2 25 04/18/2023 0343   GLUCOSE 120 (H) 04/18/2023 0343   BUN 11 04/18/2023 0343   BUN 15 10/16/2021 1602   CREATININE 1.24 (H) 04/18/2023 0343   CALCIUM 7.9 (L) 04/18/2023 0343   PROT 5.4 (L) 04/18/2023 0343   ALBUMIN 2.8 (L) 04/18/2023 0343   AST 18 04/18/2023 0343   ALT 14 04/18/2023 0343   ALKPHOS 68 04/18/2023 0343   BILITOT 1.0 04/18/2023 0343   GFRNONAA 48 (L) 04/18/2023 0343   Lipase  No results found for: "LIPASE"     Studies/Results: CT ABDOMEN PELVIS W CONTRAST  Result Date: 04/17/2023 CLINICAL DATA:  Ventral hernia open repair 2 weeks ago with mesh replacement, lower abdominal pain EXAM: CT ABDOMEN AND PELVIS WITH CONTRAST TECHNIQUE: Multidetector CT imaging of the abdomen and pelvis was performed using the standard protocol following bolus administration of intravenous contrast. RADIATION DOSE REDUCTION: This exam was performed according to the departmental dose-optimization program which includes automated  exposure control, adjustment of the mA and/or kV according to patient size and/or use of iterative reconstruction technique. CONTRAST:  OMNIPAQUE IOHEXOL 300 MG/ML  SOLN COMPARISON:  01/04/2023 FINDINGS: Lower chest: Small type 1 hiatal hernia. Hepatobiliary: Unremarkable Pancreas: Unremarkable Spleen: Unremarkable Adrenals/Urinary Tract: Unremarkable Stomach/Bowel: Mast Modic staple line in the right small bowel (image 56, series 2) and in the rectum (image 74 series 2.). Normal appendix. No dilated bowel were substantially abnormal thick-walled bowel. Vascular/Lymphatic:  Unremarkable Reproductive: Uterus absent.  Adnexa unremarkable. Other: No ascites. Musculoskeletal: The dominant complex ventral hernia has been repaired with mesh in place. There is edema and stranding along the mesh, not necessarily unexpected in this postoperative stage, there is also greater than expected nodularity in the omentum cephalad to the mesh for example on images 27 through 38 of series 2. Some of this may be from omental edema but the possibility of omental infarct or infectious process along the omentum is not totally excluded. Do not see any gas to indicate an abscess in this region. There were two previous upper abdominal hernias containing adipose tissue, the most cephalad has mildly enlarged with herniated adipose tissue currently measuring 4.4 by 2.0 by 2.9 cm. The more caudad, which is probably covered by the mesh, now presents as small lobulations of fluid density in the subcutaneous tissues at the site of prior hernia for example on images 41 through 48 of series 4. I am uncertain if this represents residual hernia sac containing fluid versus simply some subcutaneous nodularity. There is a large tubular fluid density process along the pannus along with reduction in volume of pannus, compatible with panniculectomy. Surrounding subcutaneous stranding is present. Miniscule loculation of gas along the right margin of this panniculectomy site on image 52 series 2, much less than 1 cc, possibly incidental. I do not see abnormal enhancement along the margins of the panniculectomy site to indicate an abscess. Correlate with quality of drainage at the wound site, if any. Lower lumbar spondylosis and degenerative disc disease causing right foraminal impingement at L4-5 and left foraminal impingement at L5-S1. Mildly transitional S1 vertebra. IMPRESSION: 1. Interval panniculectomy with a large tubular fluid density process along the pannus, but no rim enhancement to indicate abscess. Miniscule  loculation of gas along the right margin of this panniculectomy site, much less than 1 cc, possibly incidental. Correlate with quality of drainage at the wound site, if any. 2. Interval repair of the dominant complex ventral hernia with mesh in place. There is edema and stranding along the mesh, not necessarily unexpected in this postoperative stage, but there is also greater than expected nodularity in the omentum cephalad to the mesh. Some of this may be from omental edema but the possibility of omental infarct or infectious process along the upper omentum is not totally excluded. 3. There were two previous upper abdominal hernias containing adipose tissue, the most cephalad has mildly enlarged and at the site of the more caudad there is lobular fluid density in the subcutaneous tissues. 4. Small type 1 hiatal hernia. 5. Lower lumbar spondylosis and degenerative disc disease causing right foraminal impingement at L4-5 and left foraminal impingement at L5-S1. Electronically Signed   By: Gaylyn Rong M.D.   On: 04/17/2023 21:26   DG Chest Port 1 View  Result Date: 04/17/2023 CLINICAL DATA:  Sepsis, recent hernia repair EXAM: PORTABLE CHEST 1 VIEW COMPARISON:  04/06/2023 FINDINGS: Single frontal view of the chest demonstrates an unremarkable cardiac silhouette. Improved aeration at the lung bases.  No airspace disease, effusion, or pneumothorax. No acute bony abnormalities. IMPRESSION: 1. No acute intrathoracic process. Electronically Signed   By: Sharlet Salina M.D.   On: 04/17/2023 20:07    Anti-infectives: Anti-infectives (From admission, onward)    Start     Dose/Rate Route Frequency Ordered Stop   04/18/23 1400  ceFEPIme (MAXIPIME) 2 g in sodium chloride 0.9 % 100 mL IVPB        2 g 200 mL/hr over 30 Minutes Intravenous Every 8 hours 04/18/23 1027     04/18/23 1045  piperacillin-tazobactam (ZOSYN) IVPB 3.375 g  Status:  Discontinued        3.375 g 12.5 mL/hr over 240 Minutes Intravenous Every  8 hours 04/18/23 0958 04/18/23 0959   04/18/23 1045  ceFEPIme (MAXIPIME) 2 g in sodium chloride 0.9 % 100 mL IVPB  Status:  Discontinued        2 g 200 mL/hr over 30 Minutes Intravenous Every 8 hours 04/18/23 0959 04/18/23 1027   04/18/23 1045  metroNIDAZOLE (FLAGYL) IVPB 500 mg        500 mg 100 mL/hr over 60 Minutes Intravenous Every 12 hours 04/18/23 0959     04/18/23 0600  ceFEPIme (MAXIPIME) 2 g in sodium chloride 0.9 % 100 mL IVPB        2 g 200 mL/hr over 30 Minutes Intravenous  Once 04/18/23 0513 04/18/23 0729   04/17/23 2045  vancomycin (VANCOREADY) IVPB 2000 mg/400 mL        2,000 mg 200 mL/hr over 120 Minutes Intravenous  Once 04/17/23 2030 04/17/23 2317   04/17/23 2045  ceFEPIme (MAXIPIME) 2 g in sodium chloride 0.9 % 100 mL IVPB        2 g 200 mL/hr over 30 Minutes Intravenous  Once 04/17/23 2036 04/17/23 2143   04/17/23 2045  metroNIDAZOLE (FLAGYL) IVPB 500 mg        500 mg 100 mL/hr over 60 Minutes Intravenous  Once 04/17/23 2036 04/17/23 2250      Assessment/Plan S/P LAPAROSCOPIC REPAIR OF ABDOMINAL HERNIA WITH MESH, PANNICULECTOMY 8/15 Dr. Michaell Cowing - TMAX 103.1, HR 89-116 bpm, BP 107/54 - WBC 17 from 13 - creatinine 1.24 from 0.98, despite IVF overnight, continue IVF, may need an additional bolus - clinically the patient appears short of breath, she has some abdominal wall erythema, and she has poor PO intake and constant nausea. On her CT scan she has no evidence of bowel obstruction or enteritis. There is no visibile post-operative intra-abdominal abscess. Radiologist sees some omental edema. - start duonebs for wheezing, CXR without large effusion or pneumonia. - continue broad spectrum antibiotics to cover intra-abdominal sources as well as abdominal wall cellulitis. She may need IR draiange of abdominal wall fluid. Currently on cefepime/flagyl, may need to confinue vanc given abd wall findings --will discuss with pharmacy and Dr. Michaell Cowing. - add compazine PRN for nausea,  patient says zofran does not work. - will review patient with Dr. Michaell Cowing prior to ordering a diet.     LOS: 0 days   I reviewed nursing notes, last 24 h vitals and pain scores, last 48 h intake and output, last 24 h labs and trends, and last 24 h imaging results.  This care required moderate level of medical decision making.   Hosie Spangle, PA-C [facility] Please see Amion for pager number during day hours 7:00am-4:30pm

## 2023-04-18 NOTE — ED Notes (Signed)
Pt being transported up to floor at this time with personal belongings.

## 2023-04-19 ENCOUNTER — Inpatient Hospital Stay (HOSPITAL_COMMUNITY): Payer: Medicare Other

## 2023-04-19 DIAGNOSIS — R609 Edema, unspecified: Secondary | ICD-10-CM | POA: Diagnosis not present

## 2023-04-19 DIAGNOSIS — R5082 Postprocedural fever: Secondary | ICD-10-CM | POA: Diagnosis not present

## 2023-04-19 DIAGNOSIS — N182 Chronic kidney disease, stage 2 (mild): Secondary | ICD-10-CM | POA: Insufficient documentation

## 2023-04-19 LAB — BASIC METABOLIC PANEL
Anion gap: 9 (ref 5–15)
BUN: 24 mg/dL — ABNORMAL HIGH (ref 8–23)
CO2: 25 mmol/L (ref 22–32)
Calcium: 7.6 mg/dL — ABNORMAL LOW (ref 8.9–10.3)
Chloride: 102 mmol/L (ref 98–111)
Creatinine, Ser: 1.58 mg/dL — ABNORMAL HIGH (ref 0.44–1.00)
GFR, Estimated: 36 mL/min — ABNORMAL LOW (ref >60)
Glucose, Bld: 118 mg/dL — ABNORMAL HIGH (ref 70–99)
Potassium: 3.7 mmol/L (ref 3.5–5.1)
Sodium: 136 mmol/L (ref 135–145)

## 2023-04-19 LAB — CBC
HCT: 29 % — ABNORMAL LOW (ref 36.0–46.0)
Hemoglobin: 9 g/dL — ABNORMAL LOW (ref 12.0–15.0)
MCH: 29.3 pg (ref 26.0–34.0)
MCHC: 31 g/dL (ref 30.0–36.0)
MCV: 94.5 fL (ref 80.0–100.0)
Platelets: 226 10*3/uL (ref 150–400)
RBC: 3.07 MIL/uL — ABNORMAL LOW (ref 3.87–5.11)
RDW: 16.4 % — ABNORMAL HIGH (ref 11.5–15.5)
WBC: 9.7 10*3/uL (ref 4.0–10.5)
nRBC: 0 % (ref 0.0–0.2)

## 2023-04-19 LAB — PREALBUMIN: Prealbumin: <5 mg/dL — ABNORMAL LOW (ref 18–38)

## 2023-04-19 MED ORDER — SODIUM CHLORIDE 0.9 % IV SOLN
2.0000 g | Freq: Two times a day (BID) | INTRAVENOUS | Status: DC
  Administered 2023-04-19 – 2023-04-20 (×3): 2 g via INTRAVENOUS
  Filled 2023-04-19 (×3): qty 12.5

## 2023-04-19 MED ORDER — POLYETHYLENE GLYCOL 3350 17 G PO PACK
17.0000 g | PACK | Freq: Two times a day (BID) | ORAL | Status: DC
  Administered 2023-04-19 – 2023-04-23 (×5): 17 g via ORAL
  Filled 2023-04-19 (×8): qty 1

## 2023-04-19 MED ORDER — VANCOMYCIN HCL IN DEXTROSE 1-5 GM/200ML-% IV SOLN
1000.0000 mg | INTRAVENOUS | Status: DC
  Administered 2023-04-19: 1000 mg via INTRAVENOUS
  Filled 2023-04-19 (×2): qty 200

## 2023-04-19 MED ORDER — IPRATROPIUM-ALBUTEROL 0.5-2.5 (3) MG/3ML IN SOLN: 3.0000 mL | Freq: Four times a day (QID) | RESPIRATORY_TRACT | Status: DC | PRN

## 2023-04-19 MED ORDER — METHOCARBAMOL 500 MG PO TABS: 1000.0000 mg | ORAL_TABLET | Freq: Four times a day (QID) | ORAL | Status: DC | PRN

## 2023-04-19 MED ORDER — ENOXAPARIN SODIUM 40 MG/0.4ML IJ SOSY
40.0000 mg | PREFILLED_SYRINGE | INTRAMUSCULAR | Status: DC
  Administered 2023-04-20 – 2023-04-23 (×4): 40 mg via SUBCUTANEOUS
  Filled 2023-04-19 (×4): qty 0.4

## 2023-04-19 MED ORDER — TRAMADOL HCL 50 MG PO TABS
50.0000 mg | ORAL_TABLET | Freq: Four times a day (QID) | ORAL | Status: DC | PRN
  Filled 2023-04-19: qty 2

## 2023-04-19 MED ORDER — ACETAMINOPHEN 500 MG PO TABS
1000.0000 mg | ORAL_TABLET | Freq: Four times a day (QID) | ORAL | Status: DC
  Administered 2023-04-19 – 2023-04-23 (×16): 1000 mg via ORAL
  Filled 2023-04-19 (×17): qty 2

## 2023-04-19 MED ORDER — LACTATED RINGERS IV BOLUS
1000.0000 mL | Freq: Once | INTRAVENOUS | Status: AC
  Administered 2023-04-19: 1000 mL via INTRAVENOUS

## 2023-04-19 MED ORDER — METHOCARBAMOL 1000 MG/10ML IJ SOLN: 1000.0000 mg | Freq: Four times a day (QID) | INTRAVENOUS | Status: DC | PRN

## 2023-04-19 NOTE — Progress Notes (Signed)
04/19/2023  Jacqueline Mora [id number] 26-Dec-1955  CARE TEAM: PCP: Jarrett Soho, PA-C  Outpatient Care Team: Patient Care Team: Jarrett Soho, PA-C as PCP - General (Family Medicine) Christell Constant, MD as PCP - Cardiology (Cardiology) Karie Soda, MD as Consulting Physician (General Surgery)  Inpatient Treatment Team: Treatment Team:  [place], Md, MD Ccs, Md, MD Waldon Merl, RN Karie Soda, MD Weyman Croon [facility], MD Floyce Stakes, RN Earney Mallet, RN Merlyn Albert, MD   Problem List:   Principal Problem:   Fever postop Active Problems:   Morbid obesity (HCC)   Essential hypertension   Hyperlipidemia   Recurrent incisional hernia   Prediabetes   IBS (irritable bowel syndrome)   GERD (gastroesophageal reflux disease)   Anxiety   Hypokalemia   Nausea & vomiting   Abscess of abdominal wall   04/04/2023  POST-OPERATIVE DIAGNOSIS:   VENTRAL INCISIONAL REOCRRUING AND INCARCERATED HERNIA  Dimensions of hernia post-op:  18cm x 11cm PANNICULUS WITH HISTORY OF MESH INFECTION & GIANT HERNIA SAC   PROCEDURE:   LAPAROSCOPIC REPAIR OF ABDOMINAL HERNIA WITH MESH  (See OR Findings below) PANNICULECTOMY TAP BLOCK - BILATERAL   SURGEON:  Ardeth Sportsman, MD  OR FINDINGS:  Swiss cheese incisional hernia primarily super and periumbilical incarcerated with most of the greater omentum as well as mid transverse colon.  18 x 11 cm region total.  Old spiral metal tacks and sutures noted from prior mesh repair.  Those were excised.  No remaining mesh.   Hernia location: Periumbilical   Hernia type: Recurrent incisional - Incarcerated   Hernia size - largest dimension 18cm x 11cm   Type of repair: Laparoscopic underlay repair.  Primary repair of largest hernia              Placement of mesh: Centrally intraperitoneal with edges tucked into RECTRORECTUS & preperitoneal space   Name of mesh: Bard Ventralight dual sided  (polypropylene / Seprafilm)   Size of mesh: 33x27cm   Orientation: Transverse   Mesh overlap:  5-7cm  Assessment [facility] Stay = 1 days)      Gradually improving   Plan:  Solid diet  Bowel regimen.  Elevated creatinine most likely site of some dehydration perhaps NSAID use.  I wonder if she has some baseline chronic kidney disease since she bumped her creatinine rather easily.  I do not think she is oliguric.  Follow and treat  Hypokalemia mostly corrected.  Continue some supplementation  Some dyspnea and questionable hypoxia is resolved.  Defer to medicine on further interventions.  No evidence of a upper respiratory infection at this time.  I see no evidence of any active infection but she no longer has a fever and no longer has a white count.  I am skeptical that she had anything the abdominal wall or abdomen.  Will defer to medicine but my instinct is most likely can stop at discharge.  Urinalysis negative.  No any abscess or cellulitis and abdominal wall and nothing intra-abdominal he.  -Multimodal pain control.  Scheduled Tylenol.  I would stop ibuprofen with her elevated creatinine.  Methocarbamol and oral and IV narcotics as backup.  Chronic anemia stabilizing/improving.  -mobilize as tolerated to help recovery.  See physical therapy can help evaluate to see if she needs extra help her.  OT eval for equipment needs etc.  Anxiolysis  Hypertension -angiotensin inhibitor.  Medicine help appreciated  Hypothyroidism back on home levothyroxine  Hyperlipidemia controlled and follow   -Disposition:  Disposition:  The patient is from: Home Anticipate discharge to:  Home Anticipated Date of Discharge is:  August 31,2024   Barriers to discharge:  Pending Clinical improvement (more likely than not)  Patient currently is NOT MEDICALLY STABLE for discharge from the hospital from a surgery standpoint.      I reviewed nursing notes, last 24 h vitals and pain scores,  last 48 h intake and output, last 24 h labs and trends, and last 24 h imaging results.  I have reviewed this patient's available data, including medical history, events of note, test results, etc as part of my evaluation.   A significant portion of that time was spent in counseling. Care during the described time interval was provided by me.  This care required moderate level of medical decision making.  04/19/2023    Subjective: (Chief complaint) Patient feels much better overall  Less pain.  No longer nauseated.  Mobilizing better.  Objective:  Vital signs:  Vitals:   04/18/23 1538 04/18/23 1753 04/18/23 2101 04/19/23 0518  BP: (!) 124/55  109/61 (!) 106/50  Pulse: 99  78 68  Resp: 20  16 16   Temp: (!) 100.8 F (38.2 C) 98.3 F (36.8 C) 97.8 F (36.6 C) 97.7 F (36.5 C)  TempSrc: Oral Oral Oral Oral  SpO2: 98%  97% 96%  Weight:      Height:        Last BM Date : 04/17/23  Intake/Output   Yesterday:  08/29 0701 - 08/30 0700 In: 3849.2 [P.O.:420; I.V.:2152.2; IV Piggyback:1207.1] Out: 1989 [Urine:700; Drains:1289] This shift:  Total I/O In: 2002.9 [P.O.:420; I.V.:1146; Other:20; IV Piggyback:416.9] Out: 877 [Urine:700; Drains:177]  Bowel function:  Flatus: YES  BM:  No  Drain: Serosanguinous   Physical Exam:  General: Pt awake/alert in no acute distress.  Less pale more alert.  Mildly sore but not toxic nor sickly. Eyes: PERRL, normal EOM.  Sclera clear.  No icterus Neuro: CN II-XII intact w/o focal sensory/motor deficits. Lymph: No head/neck/groin lymphadenopathy Psych:  No delerium/psychosis/paranoia.  Oriented x 4 HENT: Normocephalic, Mucus membranes moist.  No thrush Neck: Supple, No tracheal deviation.  No obvious thyromegaly Chest: No pain to chest wall compression.  Good respiratory excursion.  No audible wheezing CV:  Pulses intact.  Regular rhythm.  No major extremity edema MS: Normal AROM mjr joints.  No obvious deformity  Abdomen: Soft.   Nondistended.  Nontender.  Old ecchymosis resolving.  No cellulitis.  No drainage.  Panniculectomy incision clean dry intact.  I had removed all her Steri-Strips   Ext:   No deformity.  No mjr edema.  No cyanosis Skin: No petechiae / purpurea.  No major sores.  Warm and dry    Results:   Cultures: Recent Results (from the past 720 hour(s))  Blood Culture (routine x 2)     Status: None (Preliminary result)   Collection Time: 04/17/23  7:42 PM   Specimen: BLOOD  Result Value Ref Range Status   Specimen Description   Final    BLOOD LEFT ANTECUBITAL Performed at [facility], [address], [place], [place] [address]    Special Requests   Final    BOTTLES DRAWN AEROBIC AND ANAEROBIC Blood Culture adequate volume Performed at [facility], [address], [place], [place] [address]    Culture   Final    NO GROWTH < 12 HOURS Performed at [facility], [address], [place], [place] [address]  Report Status PENDING  Incomplete  Blood Culture (routine x 2)     Status: None (Preliminary result)   Collection Time: 04/17/23  8:06 PM   Specimen: BLOOD  Result Value Ref Range Status   Specimen Description   Final    BLOOD LEFT ANTECUBITAL Performed at [facility], [address] [place], [place] [address]    Special Requests   Final    BOTTLES DRAWN AEROBIC AND ANAEROBIC Blood Culture adequate volume Performed at [facility], [address], [place], [place] [address]    Culture   Final    NO GROWTH < 12 HOURS Performed at [facility], [address] [place], [place] [address]    Report Status PENDING  Incomplete  SARS Coronavirus 2 by RT PCR (hospital order, performed in [facility] hospital lab) *cepheid single result test* Anterior Nasal Swab     Status: None   Collection Time: 04/17/23  8:25 PM   Specimen: Anterior Nasal Swab  Result Value Ref Range Status   SARS Coronavirus 2 by  RT PCR NEGATIVE NEGATIVE Final    Comment: (NOTE) SARS-CoV-2 target nucleic acids are NOT DETECTED.  The SARS-CoV-2 RNA is generally detectable in upper and lower respiratory specimens during the acute phase of infection. The lowest concentration of SARS-CoV-2 viral copies this assay can detect is 250 copies / mL. A negative result does not preclude SARS-CoV-2 infection and should not be used as the sole basis for treatment or other patient management decisions.  A negative result may occur with improper specimen collection / handling, submission of specimen other than nasopharyngeal swab, presence of viral mutation(s) within the areas targeted by this assay, and inadequate number of viral copies (<250 copies / mL). A negative result must be combined with clinical observations, patient history, and epidemiological information.  Fact Sheet for Patients:   RoadLapTop.co.za  Fact Sheet for Healthcare Providers: http://kim-miller.com/  This test is not yet approved or  cleared by the [place] FDA and has been authorized for detection and/or diagnosis of SARS-CoV-2 by FDA under an Emergency Use Authorization (EUA).  This EUA will remain in effect (meaning this test can be used) for the duration of the COVID-19 declaration under Section 564(b)(1) of the Act, 21 U.S.C. section 360bbb-3(b)(1), unless the authorization is terminated or revoked sooner.  Performed at [facility], [address] [place], [place] [address]   Respiratory (~20 pathogens) panel by PCR     Status: None   Collection Time: 04/18/23 12:06 PM   Specimen: Nasopharyngeal Swab; Respiratory  Result Value Ref Range Status   Adenovirus NOT DETECTED NOT DETECTED Final   Coronavirus 229E NOT DETECTED NOT DETECTED Final    Comment: (NOTE) The Coronavirus on the Respiratory Panel, DOES NOT test for the novel  Coronavirus (2019 nCoV)    Coronavirus HKU1  NOT DETECTED NOT DETECTED Final   Coronavirus NL63 NOT DETECTED NOT DETECTED Final   Coronavirus OC43 NOT DETECTED NOT DETECTED Final   Metapneumovirus NOT DETECTED NOT DETECTED Final   Rhinovirus / Enterovirus NOT DETECTED NOT DETECTED Final   Influenza A NOT DETECTED NOT DETECTED Final   Influenza B NOT DETECTED NOT DETECTED Final   Parainfluenza Virus 1 NOT DETECTED NOT DETECTED Final   Parainfluenza Virus 2 NOT DETECTED NOT DETECTED Final   Parainfluenza Virus 3 NOT DETECTED NOT DETECTED Final   Parainfluenza Virus 4 NOT DETECTED NOT DETECTED Final   Respiratory Syncytial Virus NOT DETECTED  NOT DETECTED Final   Bordetella pertussis NOT DETECTED NOT DETECTED Final   Bordetella Parapertussis NOT DETECTED NOT DETECTED Final   Chlamydophila pneumoniae NOT DETECTED NOT DETECTED Final   Mycoplasma pneumoniae NOT DETECTED NOT DETECTED Final    Comment: Performed at [facility], [address] [place], [place] [address]  Aerobic/Anaerobic Culture w Gram Stain (surgical/deep wound)     Status: None (Preliminary result)   Collection Time: 04/18/23  3:14 PM   Specimen: Wound; Serous Fluid  Result Value Ref Range Status   Specimen Description   Final    WOUND ABDOMEN Performed at [facility], [address] [place], [place] [address]    Special Requests   Final    NONE Performed at [facility], [address], [place], [place] [address]    Gram Stain   Final    MODERATE WBC PRESENT, PREDOMINANTLY PMN NO ORGANISMS SEEN Performed at [facility], [address], [place], [place] [address]    Culture PENDING  Incomplete   Report Status PENDING  Incomplete    Labs: Results for orders placed or performed during the hospital encounter of 04/17/23 (from the past 48 hour(s))  Comprehensive metabolic panel     Status: Abnormal   Collection Time: 04/17/23  7:42 PM  Result Value Ref Range   Sodium 132 (L) 135 - 145 mmol/L   Potassium 2.8 (L) 3.5 -  5.1 mmol/L   Chloride 92 (L) 98 - 111 mmol/L   CO2 26 22 - 32 mmol/L   Glucose, Bld 119 (H) 70 - 99 mg/dL    Comment: Glucose reference range applies only to samples taken after fasting for at least 8 hours.   BUN 11 8 - 23 mg/dL   Creatinine, Ser 4.74 0.44 - 1.00 mg/dL   Calcium 8.3 (L) 8.9 - 10.3 mg/dL   Total Protein 6.7 6.5 - 8.1 g/dL   Albumin 3.5 3.5 - 5.0 g/dL   AST 22 15 - 41 U/L   ALT 17 0 - 44 U/L   Alkaline Phosphatase 88 38 - 126 U/L   Total Bilirubin 1.1 0.3 - 1.2 mg/dL   GFR, Estimated >25 >95 mL/min    Comment: (NOTE) Calculated using the CKD-EPI Creatinine Equation (2021)    Anion gap 14 5 - 15    Comment: Performed at [facility], [address], [place], [place] [address]  CBC with Differential     Status: Abnormal   Collection Time: 04/17/23  7:42 PM  Result Value Ref Range   WBC 13.6 (H) 4.0 - 10.5 K/uL   RBC 3.85 (L) 3.87 - 5.11 MIL/uL   Hemoglobin 11.2 (L) 12.0 - 15.0 g/dL   HCT 64.3 (L) 32.9 - 51.8 %   MCV 90.9 80.0 - 100.0 fL   MCH 29.1 26.0 - 34.0 pg   MCHC 32.0 30.0 - 36.0 g/dL   RDW 84.1 (H) 66.0 - 63.0 %   Platelets 330 150 - 400 K/uL   nRBC 0.0 0.0 - 0.2 %   Neutrophils Relative % 89 %   Neutro Abs 12.0 (H) 1.7 - 7.7 K/uL   Lymphocytes Relative 5 %   Lymphs Abs 0.7 0.7 - 4.0 K/uL   Monocytes Relative 4 %   Monocytes Absolute 0.6 0.1 - 1.0 K/uL   Eosinophils Relative 2 %   Eosinophils Absolute 0.2 0.0 - 0.5 K/uL   Basophils Relative 0 %   Basophils Absolute 0.0  0.0 - 0.1 K/uL   Immature Granulocytes 0 %   Abs Immature Granulocytes 0.04 0.00 - 0.07 K/uL    Comment: Performed at [facility], [address] [place], [place] [address]  Protime-INR     Status: None   Collection Time: 04/17/23  7:42 PM  Result Value Ref Range   Prothrombin Time 13.4 11.4 - 15.2 seconds   INR 1.0 0.8 - 1.2    Comment: (NOTE) INR goal varies based on device and disease states. Performed at [facility],  [address], [place], [place] [address]   APTT     Status: None   Collection Time: 04/17/23  7:42 PM  Result Value Ref Range   aPTT 31 24 - 36 seconds    Comment: Performed at [facility], [address], [place], [place] [address]  Blood Culture (routine x 2)     Status: None (Preliminary result)   Collection Time: 04/17/23  7:42 PM   Specimen: BLOOD  Result Value Ref Range   Specimen Description      BLOOD LEFT ANTECUBITAL Performed at [facility], [address] [place], [place] [address]    Special Requests      BOTTLES DRAWN AEROBIC AND ANAEROBIC Blood Culture adequate volume Performed at [facility], [address], [place], [place] [address]    Culture      NO GROWTH < 12 HOURS Performed at [facility], [address], [place], [place] [address]    Report Status PENDING   I-Stat Lactic Acid, ED     Status: None   Collection Time: 04/17/23  7:55 PM  Result Value Ref Range   Lactic Acid, Venous 1.5 0.5 - 1.9 mmol/L  Blood Culture (routine x 2)     Status: None (Preliminary result)   Collection Time: 04/17/23  8:06 PM   Specimen: BLOOD  Result Value Ref Range   Specimen Description      BLOOD LEFT ANTECUBITAL Performed at [facility], [address], [place], [place] [address]    Special Requests      BOTTLES DRAWN AEROBIC AND ANAEROBIC Blood Culture adequate volume Performed at [facility], [address], [place], [place] [address]    Culture      NO GROWTH < 12 HOURS Performed at [facility], [address], [place], [place] [address]    Report Status PENDING   SARS Coronavirus 2 by RT PCR (hospital order, performed in [facility] hospital lab) *cepheid single result test* Anterior Nasal Swab     Status: None   Collection Time: 04/17/23  8:25 PM   Specimen: Anterior Nasal Swab  Result Value Ref Range   SARS Coronavirus 2 by RT PCR NEGATIVE  NEGATIVE    Comment: (NOTE) SARS-CoV-2 target nucleic acids are NOT DETECTED.  The SARS-CoV-2 RNA is generally detectable in upper and lower respiratory specimens during the acute phase of infection. The lowest concentration of SARS-CoV-2 viral copies this assay can detect is 250 copies / mL. A negative result does not preclude SARS-CoV-2 infection and should not be used as the sole basis for treatment or other patient management decisions.  A negative result may occur with improper specimen collection / handling, submission of specimen other than nasopharyngeal swab, presence of viral mutation(s) within the areas targeted by this assay, and inadequate number of viral copies (<250 copies / mL). A negative result must  be combined with clinical observations, patient history, and epidemiological information.  Fact Sheet for Patients:   RoadLapTop.co.za  Fact Sheet for Healthcare Providers: http://kim-miller.com/  This test is not yet approved or  cleared by the [place] FDA and has been authorized for detection and/or diagnosis of SARS-CoV-2 by FDA under an Emergency Use Authorization (EUA).  This EUA will remain in effect (meaning this test can be used) for the duration of the COVID-19 declaration under Section 564(b)(1) of the Act, 21 U.S.C. section 360bbb-3(b)(1), unless the authorization is terminated or revoked sooner.  Performed at [facility], [address], [place], [place] [address]   Magnesium     Status: Abnormal   Collection Time: 04/18/23  3:42 AM  Result Value Ref Range   Magnesium 1.6 (L) 1.7 - 2.4 mg/dL    Comment: Performed at [facility], [address] [place], [place] [address]  Comprehensive metabolic panel     Status: Abnormal   Collection Time: 04/18/23  3:43 AM  Result Value Ref Range   Sodium 135 135 - 145 mmol/L   Potassium 3.1 (L) 3.5 - 5.1 mmol/L   Chloride 96  (L) 98 - 111 mmol/L   CO2 25 22 - 32 mmol/L   Glucose, Bld 120 (H) 70 - 99 mg/dL    Comment: Glucose reference range applies only to samples taken after fasting for at least 8 hours.   BUN 11 8 - 23 mg/dL   Creatinine, Ser 0.98 (H) 0.44 - 1.00 mg/dL   Calcium 7.9 (L) 8.9 - 10.3 mg/dL   Total Protein 5.4 (L) 6.5 - 8.1 g/dL   Albumin 2.8 (L) 3.5 - 5.0 g/dL   AST 18 15 - 41 U/L   ALT 14 0 - 44 U/L   Alkaline Phosphatase 68 38 - 126 U/L   Total Bilirubin 1.0 0.3 - 1.2 mg/dL   GFR, Estimated 48 (L) >60 mL/min    Comment: (NOTE) Calculated using the CKD-EPI Creatinine Equation (2021)    Anion gap 14 5 - 15    Comment: Performed at [facility], [address], [place], [place] [address]  CBC     Status: Abnormal   Collection Time: 04/18/23  3:43 AM  Result Value Ref Range   WBC 17.2 (H) 4.0 - 10.5 K/uL   RBC 3.51 (L) 3.87 - 5.11 MIL/uL   Hemoglobin 10.0 (L) 12.0 - 15.0 g/dL   HCT 78.2 (L) 95.6 - 21.3 %   MCV 93.2 80.0 - 100.0 fL   MCH 28.5 26.0 - 34.0 pg   MCHC 30.6 30.0 - 36.0 g/dL   RDW 08.6 (H) 57.8 - 46.9 %   Platelets 305 150 - 400 K/uL   nRBC 0.0 0.0 - 0.2 %    Comment: Performed at [facility], [address] [place], [place] [address]  HIV Antibody (routine testing w rflx)     Status: None   Collection Time: 04/18/23  3:43 AM  Result Value Ref Range   HIV Screen 4th Generation wRfx Non Reactive Non Reactive    Comment: Performed at [facility], [address] [place], [place] [address]  Respiratory (~20 pathogens) panel by PCR     Status: None   Collection Time: 04/18/23 12:06 PM   Specimen: Nasopharyngeal Swab; Respiratory  Result Value Ref Range   Adenovirus NOT DETECTED NOT DETECTED   Coronavirus 229E NOT DETECTED NOT DETECTED    Comment: (NOTE) The  Coronavirus on the Respiratory Panel, DOES NOT test for the novel  Coronavirus (2019 nCoV)    Coronavirus HKU1 NOT DETECTED NOT DETECTED   Coronavirus NL63 NOT DETECTED  NOT DETECTED   Coronavirus OC43 NOT DETECTED NOT DETECTED   Metapneumovirus NOT DETECTED NOT DETECTED   Rhinovirus / Enterovirus NOT DETECTED NOT DETECTED   Influenza A NOT DETECTED NOT DETECTED   Influenza B NOT DETECTED NOT DETECTED   Parainfluenza Virus 1 NOT DETECTED NOT DETECTED   Parainfluenza Virus 2 NOT DETECTED NOT DETECTED   Parainfluenza Virus 3 NOT DETECTED NOT DETECTED   Parainfluenza Virus 4 NOT DETECTED NOT DETECTED   Respiratory Syncytial Virus NOT DETECTED NOT DETECTED   Bordetella pertussis NOT DETECTED NOT DETECTED   Bordetella Parapertussis NOT DETECTED NOT DETECTED   Chlamydophila pneumoniae NOT DETECTED NOT DETECTED   Mycoplasma pneumoniae NOT DETECTED NOT DETECTED    Comment: Performed at [facility], [address], [place], [place] [address]  Aerobic/Anaerobic Culture w Gram Stain (surgical/deep wound)     Status: None (Preliminary result)   Collection Time: 04/18/23  3:14 PM   Specimen: Wound; Serous Fluid  Result Value Ref Range   Specimen Description      WOUND ABDOMEN Performed at [facility], [address] [place], [place] [address]    Special Requests      NONE Performed at [facility], [address] [place], [place] [address]    Gram Stain      MODERATE WBC PRESENT, PREDOMINANTLY PMN NO ORGANISMS SEEN Performed at [facility], [address], [place], [place] [address]    Culture PENDING    Report Status PENDING   Urinalysis, Routine w reflex microscopic -Urine, Clean Catch     Status: Abnormal   Collection Time: 04/18/23 11:03 PM  Result Value Ref Range   Color, Urine YELLOW YELLOW   APPearance HAZY (A) CLEAR   Specific Gravity, Urine 1.029 1.005 - 1.030   pH 5.0 5.0 - 8.0   Glucose, UA NEGATIVE NEGATIVE mg/dL   Hgb urine dipstick NEGATIVE NEGATIVE   Bilirubin Urine NEGATIVE NEGATIVE   Ketones, ur 5 (A) NEGATIVE mg/dL   Protein, ur NEGATIVE NEGATIVE mg/dL   Nitrite NEGATIVE NEGATIVE    Leukocytes,Ua NEGATIVE NEGATIVE    Comment: Performed at [facility], [address] [place], [place] [address]  CBC     Status: Abnormal   Collection Time: 04/19/23  3:31 AM  Result Value Ref Range   WBC 9.7 4.0 - 10.5 K/uL   RBC 3.07 (L) 3.87 - 5.11 MIL/uL   Hemoglobin 9.0 (L) 12.0 - 15.0 g/dL   HCT 13.0 (L) 86.5 - 78.4 %   MCV 94.5 80.0 - 100.0 fL   MCH 29.3 26.0 - 34.0 pg   MCHC 31.0 30.0 - 36.0 g/dL   RDW 69.6 (H) 29.5 - 28.4 %   Platelets 226 150 - 400 K/uL   nRBC 0.0 0.0 - 0.2 %    Comment: Performed at [facility], [address] [place], [place] [address]  Basic metabolic panel     Status: Abnormal   Collection Time: 04/19/23  3:31 AM  Result Value Ref Range   Sodium 136 135 - 145 mmol/L   Potassium 3.7 3.5 - 5.1 mmol/L   Chloride 102 98 - 111 mmol/L   CO2 25 22 - 32 mmol/L   Glucose, Bld 118 (H) 70 - 99 mg/dL  Comment: Glucose reference range applies only to samples taken after fasting for at least 8 hours.   BUN 24 (H) 8 - 23 mg/dL   Creatinine, Ser 2.72 (H) 0.44 - 1.00 mg/dL   Calcium 7.6 (L) 8.9 - 10.3 mg/dL   GFR, Estimated 36 (L) >60 mL/min    Comment: (NOTE) Calculated using the CKD-EPI Creatinine Equation (2021)    Anion gap 9 5 - 15    Comment: Performed at [facility], [address], [place], [place] [address]    Imaging / Studies: [place] Abscess Drain  Result Date: 04/18/2023 INDICATION: 67 year old with large abdominal subcutaneous fluid collections following panniculectomy and repair of abdominal hernia. EXAM: 1. Placement of percutaneous drain in the right abdominal subcutaneous fluid collection 2. Placement of percutaneous drain in the left abdominal subcutaneous fluid collection MEDICATIONS: Moderate sedation ANESTHESIA/SEDATION: Moderate (conscious) sedation was employed during this procedure. A total of Versed 2mg  and fentanyl 100 mcg was administered intravenously at the order of the  provider performing the procedure. Total intra-service moderate sedation time: 40 minutes. Patient's level of consciousness and vital signs were monitored continuously by radiology nurse throughout the procedure under the supervision of the provider performing the procedure. COMPLICATIONS: None immediate. PROCEDURE: Informed written consent was obtained from the patient after a thorough discussion of the procedural risks, benefits and alternatives. All questions were addressed. A timeout was performed prior to the initiation of the procedure. Both sides of the abdomen were obtained. Large subcutaneous fluid collections were identified near the surgical incisions on both sides of the abdomen. The left and right side of the abdomen were prepped with chlorhexidine and sterile field was created. Skin was anesthetized along the right side of the abdomen with 1% lidocaine and a small incision was made. Using ultrasound guidance, a Yueh catheter was directed into the large subcutaneous fluid collection and serosanguineous fluid was aspirated. Superstiff Amplatz wire was advanced into the collection. The tract was dilated to accommodate a 12 [place] drain. Large amount of serosanguineous fluid was removed. At one point, the drain needed to be flush to continue aspirating fluid. Drain was attached to a suction bulb. Ultrasound demonstrated a large amount of residual subcutaneous fluid along the left side of the abdomen. Therefore, attention was directed to the left side of the abdominal subcutaneous fluid collection. The left side of the abdomen was anesthetized with 1% lidocaine and a small incision was made. Using ultrasound guidance, a Yueh catheter was directed into the subcutaneous fluid collection and serosanguineous fluid was aspirated. Superstiff Amplatz wire was advanced into the collection and the tract was dilated to accommodate a 12 [place] drain. Additional serosanguineous fluid was removed. Both drains were  attached to suction bulbs. Sample sent for culture. Both drains were sutured to skin and sterile dressings were placed. FINDINGS: Large complex fluid collections in the anterior abdomen most prominent along the lateral aspects of the abdomen. Bilateral 12 French drains were placed. Greater than 900 mL of serosanguineous fluid was removed from these collections. The subcutaneous fluid collections were markedly decreased by the end of the procedure. IMPRESSION: Ultrasound-guided placement of two abdominal subcutaneous drainage catheters. Greater than 900 mL of serosanguineous fluid was removed from the abdominal subcutaneous collections. Fluid was sent for culture. Electronically Signed   By: Richarda Overlie M.D.   On: 04/18/2023 16:39   [place] Abscess Drain  Result Date: 04/18/2023 INDICATION: 67 year old with large abdominal subcutaneous fluid collections following panniculectomy and repair of abdominal hernia. EXAM: 1. Placement  of percutaneous drain in the right abdominal subcutaneous fluid collection 2. Placement of percutaneous drain in the left abdominal subcutaneous fluid collection MEDICATIONS: Moderate sedation ANESTHESIA/SEDATION: Moderate (conscious) sedation was employed during this procedure. A total of Versed 2mg  and fentanyl 100 mcg was administered intravenously at the order of the provider performing the procedure. Total intra-service moderate sedation time: 40 minutes. Patient's level of consciousness and vital signs were monitored continuously by radiology nurse throughout the procedure under the supervision of the provider performing the procedure. COMPLICATIONS: None immediate. PROCEDURE: Informed written consent was obtained from the patient after a thorough discussion of the procedural risks, benefits and alternatives. All questions were addressed. A timeout was performed prior to the initiation of the procedure. Both sides of the abdomen were obtained. Large subcutaneous fluid collections were  identified near the surgical incisions on both sides of the abdomen. The left and right side of the abdomen were prepped with chlorhexidine and sterile field was created. Skin was anesthetized along the right side of the abdomen with 1% lidocaine and a small incision was made. Using ultrasound guidance, a Yueh catheter was directed into the large subcutaneous fluid collection and serosanguineous fluid was aspirated. Superstiff Amplatz wire was advanced into the collection. The tract was dilated to accommodate a 12 [place] drain. Large amount of serosanguineous fluid was removed. At one point, the drain needed to be flush to continue aspirating fluid. Drain was attached to a suction bulb. Ultrasound demonstrated a large amount of residual subcutaneous fluid along the left side of the abdomen. Therefore, attention was directed to the left side of the abdominal subcutaneous fluid collection. The left side of the abdomen was anesthetized with 1% lidocaine and a small incision was made. Using ultrasound guidance, a Yueh catheter was directed into the subcutaneous fluid collection and serosanguineous fluid was aspirated. Superstiff Amplatz wire was advanced into the collection and the tract was dilated to accommodate a 12 [place] drain. Additional serosanguineous fluid was removed. Both drains were attached to suction bulbs. Sample sent for culture. Both drains were sutured to skin and sterile dressings were placed. FINDINGS: Large complex fluid collections in the anterior abdomen most prominent along the lateral aspects of the abdomen. Bilateral 12 French drains were placed. Greater than 900 mL of serosanguineous fluid was removed from these collections. The subcutaneous fluid collections were markedly decreased by the end of the procedure. IMPRESSION: Ultrasound-guided placement of two abdominal subcutaneous drainage catheters. Greater than 900 mL of serosanguineous fluid was removed from the abdominal subcutaneous  collections. Fluid was sent for culture. Electronically Signed   By: Richarda Overlie M.D.   On: 04/18/2023 16:39   CT ABDOMEN PELVIS W CONTRAST  Result Date: 04/17/2023 CLINICAL DATA:  Ventral hernia open repair 2 weeks ago with mesh replacement, lower abdominal pain EXAM: CT ABDOMEN AND PELVIS WITH CONTRAST TECHNIQUE: Multidetector CT imaging of the abdomen and pelvis was performed using the standard protocol following bolus administration of intravenous contrast. RADIATION DOSE REDUCTION: This exam was performed according to the departmental dose-optimization program which includes automated exposure control, adjustment of the mA and/or kV according to patient size and/or use of iterative reconstruction technique. CONTRAST:  OMNIPAQUE IOHEXOL 300 MG/ML  SOLN COMPARISON:  01/04/2023 FINDINGS: Lower chest: Small type 1 hiatal hernia. Hepatobiliary: Unremarkable Pancreas: Unremarkable Spleen: Unremarkable Adrenals/Urinary Tract: Unremarkable Stomach/Bowel: Mast Modic staple line in the right small bowel (image 56, series 2) and in the rectum (image 74 series 2.). Normal appendix. No dilated bowel were substantially abnormal  thick-walled bowel. Vascular/Lymphatic: Unremarkable Reproductive: Uterus absent.  Adnexa unremarkable. Other: No ascites. Musculoskeletal: The dominant complex ventral hernia has been repaired with mesh in place. There is edema and stranding along the mesh, not necessarily unexpected in this postoperative stage, there is also greater than expected nodularity in the omentum cephalad to the mesh for example on images 27 through 38 of series 2. Some of this may be from omental edema but the possibility of omental infarct or infectious process along the omentum is not totally excluded. Do not see any gas to indicate an abscess in this region. There were two previous upper abdominal hernias containing adipose tissue, the most cephalad has mildly enlarged with herniated adipose tissue currently  measuring 4.4 by 2.0 by 2.9 cm. The more caudad, which is probably covered by the mesh, now presents as small lobulations of fluid density in the subcutaneous tissues at the site of prior hernia for example on images 41 through 48 of series 4. I am uncertain if this represents residual hernia sac containing fluid versus simply some subcutaneous nodularity. There is a large tubular fluid density process along the pannus along with reduction in volume of pannus, compatible with panniculectomy. Surrounding subcutaneous stranding is present. Miniscule loculation of gas along the right margin of this panniculectomy site on image 52 series 2, much less than 1 cc, possibly incidental. I do not see abnormal enhancement along the margins of the panniculectomy site to indicate an abscess. Correlate with quality of drainage at the wound site, if any. Lower lumbar spondylosis and degenerative disc disease causing right foraminal impingement at L4-5 and left foraminal impingement at L5-S1. Mildly transitional S1 vertebra. IMPRESSION: 1. Interval panniculectomy with a large tubular fluid density process along the pannus, but no rim enhancement to indicate abscess. Miniscule loculation of gas along the right margin of this panniculectomy site, much less than 1 cc, possibly incidental. Correlate with quality of drainage at the wound site, if any. 2. Interval repair of the dominant complex ventral hernia with mesh in place. There is edema and stranding along the mesh, not necessarily unexpected in this postoperative stage, but there is also greater than expected nodularity in the omentum cephalad to the mesh. Some of this may be from omental edema but the possibility of omental infarct or infectious process along the upper omentum is not totally excluded. 3. There were two previous upper abdominal hernias containing adipose tissue, the most cephalad has mildly enlarged and at the site of the more caudad there is lobular fluid  density in the subcutaneous tissues. 4. Small type 1 hiatal hernia. 5. Lower lumbar spondylosis and degenerative disc disease causing right foraminal impingement at L4-5 and left foraminal impingement at L5-S1. Electronically Signed   By: Gaylyn Rong M.D.   On: 04/17/2023 21:26   DG Chest Port 1 View  Result Date: 04/17/2023 CLINICAL DATA:  Sepsis, recent hernia repair EXAM: PORTABLE CHEST 1 VIEW COMPARISON:  04/06/2023 FINDINGS: Single frontal view of the chest demonstrates an unremarkable cardiac silhouette. Improved aeration at the lung bases. No airspace disease, effusion, or pneumothorax. No acute bony abnormalities. IMPRESSION: 1. No acute intrathoracic process. Electronically Signed   By: Sharlet Salina M.D.   On: 04/17/2023 20:07    Medications / Allergies: per chart  Antibiotics: Anti-infectives (From admission, onward)    Start     Dose/Rate Route Frequency Ordered Stop   04/18/23 2200  vancomycin (VANCOREADY) IVPB 1250 mg/250 mL        1,250 mg  166.7 mL/hr over 90 Minutes Intravenous Every 24 hours 04/18/23 1233     04/18/23 1500  ceFAZolin (ANCEF) IVPB 2g/100 mL premix  Status:  Discontinued        2 g 200 mL/hr over 30 Minutes Intravenous Every 8 hours 04/18/23 1127 04/18/23 1314   04/18/23 1415  ceFEPIme (MAXIPIME) 2 g in sodium chloride 0.9 % 100 mL IVPB        2 g 200 mL/hr over 30 Minutes Intravenous Every 8 hours 04/18/23 1318     04/18/23 1400  ceFEPIme (MAXIPIME) 2 g in sodium chloride 0.9 % 100 mL IVPB  Status:  Discontinued        2 g 200 mL/hr over 30 Minutes Intravenous Every 8 hours 04/18/23 1027 04/18/23 1127   04/18/23 1045  piperacillin-tazobactam (ZOSYN) IVPB 3.375 g  Status:  Discontinued        3.375 g 12.5 mL/hr over 240 Minutes Intravenous Every 8 hours 04/18/23 0958 04/18/23 0959   04/18/23 1045  ceFEPIme (MAXIPIME) 2 g in sodium chloride 0.9 % 100 mL IVPB  Status:  Discontinued        2 g 200 mL/hr over 30 Minutes Intravenous Every 8 hours  04/18/23 0959 04/18/23 1027   04/18/23 1045  metroNIDAZOLE (FLAGYL) IVPB 500 mg  Status:  Discontinued        500 mg 100 mL/hr over 60 Minutes Intravenous Every 12 hours 04/18/23 0959 04/18/23 1127   04/18/23 0600  ceFEPIme (MAXIPIME) 2 g in sodium chloride 0.9 % 100 mL IVPB        2 g 200 mL/hr over 30 Minutes Intravenous  Once 04/18/23 0513 04/18/23 0729   04/17/23 2045  vancomycin (VANCOREADY) IVPB 2000 mg/400 mL        2,000 mg 200 mL/hr over 120 Minutes Intravenous  Once 04/17/23 2030 04/17/23 2317   04/17/23 2045  ceFEPIme (MAXIPIME) 2 g in sodium chloride 0.9 % 100 mL IVPB        2 g 200 mL/hr over 30 Minutes Intravenous  Once 04/17/23 2036 04/17/23 2143   04/17/23 2045  metroNIDAZOLE (FLAGYL) IVPB 500 mg        500 mg 100 mL/hr over 60 Minutes Intravenous  Once 04/17/23 2036 04/17/23 2250         Note: Portions of this report may have been transcribed using voice recognition software. Every effort was made to ensure accuracy; however, inadvertent computerized transcription errors may be present.   Any transcriptional errors that result from this process are unintentional.    Ardeth Sportsman, MD, FACS, MASCRS Esophageal, Gastrointestinal & Colorectal Surgery Robotic and Minimally Invasive Surgery  [facility] [address], [address] [place], [place] [address] [phone] Fax [phone] Main  CONTACT INFORMATION: Weekday (9AM-5PM): Call [facility] main office at [phone] Weeknight (5PM-9AM) or Weekend/Holiday: Check EPIC "Web Links" tab & use "AMION" (password " [email or web address]") for General Surgery [facility] coverage  Please, DO NOT use SecureChat  (it is not reliable communication to reach operating surgeons & will lead to a delay in care).   Epic staff messaging available for outptient concerns needing 1-2 business day response.      04/19/2023  6:51 AM

## 2023-04-19 NOTE — Discharge Instructions (Signed)
DRAIN CARE:   You have a closed bulb drain to help you heal.    Bulb Closed Drain Care  The bulb drainage system has flexible tubing attached to a soft, plastic egg shaped clear plastic bulb with a stopper. The drainage end of the tubing goes into a cavity your body (surgery area, abscess, etc) through a small opening in your skin. A stitch & tape holds the drainage end in place. The rest of the tube is outside your body, attached to a bulb. When the bulb is squeezed down & compressed with the stopper in place, it creates a constant gentle suction.  This helps draw out fluid that collects in the bulb to help the inner cavity to shrink down & heal. The bulb should be compressed at all times, except when you are emptying the drainage.  How long you will have your drain depends on your recovery & the amount & type of fluid is draining. This is different for everyone. The drain & tubing is usually removed when the drainage is 30 mL or less a day. to your follow-up appointments.  Caring for Your DRAIN In order to care for your Jackson-Pratt at home, you or your caregiver will do the following:  Empty the drain at least once a day and record the color and amount of drainage  Care for the area where the tubing enters your skin by washing with soap and water. Place the bulb in a pocket or safety-pin to your clothes to keep it from pulling at the skin  Milk or flush the tubing to keep the tubing from getting plugged up / blocked.   Do this before you empty and measure your drainage. Look in the mirror at the tubing. This will help you see where your hands need to be. Pinch the tubing close to where it goes into your skin between your thumb and forefinger. With the thumb and forefinger of your other hand, pinch the tubing right below your other fingers. Keep your fingers pinched and slide them down the tubing, pushing any clots down toward the bulb. You may want to use alcohol swabs to help you slide  your fingers down the tubing. Repeat steps 3 and 4 as necessary to push clots from the tubing into the bulb. If you are not able to move a clot into the bulb, call your doctor's office. The fluid may leak around the insertion site if a clot is blocking the drainage flow. If there is fluid in the bulb and no leakage at the insertion site, the drain is working.   Caring for the Insertion Site  Once you have emptied the drainage, clean your hands again.  Check the area around the insertion site:  Redness, swelling, crusting, or yellow scab can form around a drain over time. Usually this is smaller than a dime & stays small  If you have worsening tenderness, swelling, or pus or a fever >101.38F; you may have an infection. Call your doctor's office.  Wash drain site with soap & water (dilute hydrogen peroxide PRN) daily & replace clean dressing / tape    DAILY DRAIN CARE Keep the bulb compressed at all times, except while emptying it. The compression creates suction to gently drain & closed the cavity .  Keep sites where the tubes enter the skin dry and covered with a light bandage (dressing).  Tape the tubes to your skin, 1 to 2 inches away from the insertion site, to keep from pulling  on your stitches. Tubes are stitched in place and will not slip out.  Pin the bulb to your shirt (not to your pants) with a safety pin.  For the first few days after surgery, there usually is more fluid in the bulb. Empty the bulb whenever it becomes half full because the bulb does not create enough suction if it is too full. Include this amount in your 24 hour totals.  When the amount of drainage decreases, empty the bulb at the same time every day. Write down the amounts and the 24 hour totals. Your caregiver will want to know them. This helps your caregiver know when the tubes can be removed.  (We anticipate removing the drain in 1-3 weeks, depending on when the output is <38mL a day for 2+ days) If there is  drainage around the tube sites, change dressings and keep the area dry. If you see a clot in the tube, leave it alone. However, if the tube does not appear to be draining, let your caregiver know.   TO EMPTY THE BULB Wash your hands before & after Open the stopper to release suction.  Holding the stopper out of the way, pour drainage into the measuring cup that was sent home with you.  Measure and write down the amount. If there are 2 bulbs, note the amount of drainage from bulb 1 or bulb 2 and keep the totals separate.  Compress the bulb by folding it in half. & replace the stopper.  Check the tape that holds the tube to your skin, and pin the bulb to your shirt.    SEEK MEDICAL CARE IF: The drainage develops a bad odor.  You have an oral temperature above 101.5 F (38.5 C).  The amount of drainage from your wound suddenly increases or decreases.  You accidentally pull out your drain.  You have any other questions or concerns.      Call our office if you have any questions about your drain. [phone]    Managing Pain  ######################################################################   CONTROL PAIN Control pain so that you can walk, sleep, tolerate sneezing/coughing, go up/down stairs.  (Good pain control is not pain free only when lying still, unable to move)  WALK Walk an hour a day.  Control your pain to do that.   HAVE A BOWEL MOVEMENT DAILY Keep your bowels regular to avoid problems.  OK to try a laxative to override constipation.  OK to use an antidairrheal to slow down diarrhea.  Call if not better after 2 tries  CALL IF YOU HAVE PROBLEMS/CONCERNS Call if you are still struggling despite following these instructions. Call if you have concerns not answered by these instructions  ######################################################################   Pain after surgery or related to activity is often due to strain/injury to muscle, tendon, nerves and/or  incisions.  This pain is usually short-term and will improve in a few months.   Many people find it helpful to do the following things TOGETHER to help speed the process of healing and to get back to regular activity more quickly:  Avoid heavy physical activity at first No lifting greater than 20 pounds at first, then increase to lifting as tolerated over the next few weeks Do not "push through" the pain.  Listen to your body and avoid positions and maneuvers than reproduce the pain.  Wait a few days before trying something more intense Walking is okay as tolerated, but go slowly and stop when getting sore.  If you  can walk 30 minutes without stopping or pain, you can try more intense activity (running, jogging, aerobics, cycling, swimming, treadmill, sex, sports, weightlifting, etc ) Remember: If it hurts to do it, then don't do it!  Take Acetaminophen Anti-inflammatory medication Acetaminophen 500mg  tabs (Tylenol) 1-2 pills with every meal and just before bedtime (avoid if you have liver problems) Take with food/snack around the clock for 1-2 weeks This helps the muscle and nerve tissues become less irritable and calm down faster  Use a Heating pad or Ice/Cold Pack 4-6 times a day May use warm bath/hottub  or showers  Try Gentle Massage and/or Stretching  at the area of pain many times a day stop if you feel pain - do not overdo it  Try these steps together to help you body heal faster and avoid making things get worse.  Doing just one of these things may not be enough.    If you are not getting better after two weeks or are noticing you are getting worse, contact our office for further advice; we may need to re-evaluate you & see what other things we can do to help.    GETTING TO GOOD BOWEL HEALTH.  ######################################################################  EAT Gradually transition to a high fiber diet with a fiber supplement over the next few weeks after discharge.   Start with a pureed / full liquid diet (see below)  WALK Walk an hour a day.  Control your pain to do that.    HAVE A BOWEL MOVEMENT DAILY Keep your bowels regular to avoid problems.  OK to try a laxative to override constipation.  OK to use an antidairrheal to slow down diarrhea.  Call if not better after 2 tries  CALL IF YOU HAVE PROBLEMS/CONCERNS Call if you are still struggling despite following these instructions. Call if you have concerns not answered by these instructions  ######################################################################   Irregular bowel habits such as constipation and diarrhea can lead to many problems over time.  Having one soft bowel movement a day is the most important way to prevent further problems.  The anorectal canal is designed to handle stretching and feces to safely manage our ability to get rid of solid waste (feces, poop, stool) out of our body.  BUT, hard constipated stools can act like ripping concrete bricks and diarrhea can be a burning fire to this very sensitive area of our body, causing inflamed hemorrhoids, anal fissures, increasing risk is perirectal abscesses, abdominal pain/bloating, an making irritable bowel worse.      The goal: ONE SOFT BOWEL MOVEMENT A DAY!  To have soft, regular bowel movements:  Drink plenty of fluids, consider 4-6 tall glasses of water a day.   Take plenty of fiber.  Fiber is the undigested part of plant food that passes into the colon, acting s "natures broom" to encourage bowel motility and movement.  Fiber can absorb and hold large amounts of water. This results in a larger, bulkier stool, which is soft and easier to pass. Work gradually over several weeks up to 6 servings a day of fiber (25g a day even more if needed) in the form of: Vegetables -- Root (potatoes, carrots, turnips), leafy green (lettuce, salad greens, celery, spinach), or cooked high residue (cabbage, broccoli, etc) Fruit -- Fresh (unpeeled skin &  pulp), Dried (prunes, apricots, cherries, etc ),  or stewed ( applesauce)  Whole grain breads, pasta, etc (whole wheat)  Bran cereals  Bulking Agents -- This type of water-retaining fiber generally  is easily obtained each day by one of the following:  Psyllium bran -- The psyllium plant is remarkable because its ground seeds can retain so much water. This product is available as Metamucil, Konsyl, Effersyllium, Per Diem Fiber, or the less expensive generic preparation in drug and health food stores. Although labeled a laxative, it really is not a laxative.  Methylcellulose -- This is another fiber derived from wood which also retains water. It is available as Citrucel. Polyethylene Glycol - and "artificial" fiber commonly called Miralax or Glycolax.  It is helpful for people with gassy or bloated feelings with regular fiber Flax Seed - a less gassy fiber than psyllium No reading or other relaxing activity while on the toilet. If bowel movements take longer than 5 minutes, you are too constipated AVOID CONSTIPATION.  High fiber and water intake usually takes care of this.  Sometimes a laxative is needed to stimulate more frequent bowel movements, but  Laxatives are not a good long-term solution as it can wear the colon out.  They can help jump-start bowels if constipated, but should be relied on constantly without discussing with your doctor Osmotics (Milk of Magnesia, Fleets phosphosoda, Magnesium citrate, MiraLax, GoLytely) are safer than  Stimulants (Senokot, Castor Oil, Dulcolax, Ex Lax)    Avoid taking laxatives for more than 7 days in a row.  IF SEVERELY CONSTIPATED, try a Bowel Retraining Program: Do not use laxatives.  Eat a diet high in roughage, such as bran cereals and leafy vegetables.  Drink six (6) ounces of prune or apricot juice each morning.  Eat two (2) large servings of stewed fruit each day.  Take one (1) heaping tablespoon of a psyllium-based bulking agent twice a day. Use  sugar-free sweetener when possible to avoid excessive calories.  Eat a normal breakfast.  Set aside 15 minutes after breakfast to sit on the toilet, but do not strain to have a bowel movement.  If you do not have a bowel movement by the third day, use an enema and repeat the above steps.   CONTROLLING DIARRHEA  TAKE A FIBER SUPPLEMENT (FiberCon or Benefiner soluble fiber) twice a day - to thicken stools by absorbing excess fluid and retrain the intestines to act more normally.  Slowly increase the dose over a few weeks.  Too much fiber too soon can backfire and cause cramping & bloating.  TAKE AN IRON SUPPLEMENT twice a day to naturally constipate your bowels.  Usually ferrous sulfate 325mg  twice a day)  TAKE ANTI-DIARRHEAL MEDICINES: Loperamide (Imodium) can slow down diarrhea.  Start with two tablets (= 4mg ) first and then try one tablet every 6 hours.  Can go up to 2 pills four times day (8 pills of 2mg  max) Avoid if you are having fevers or severe pain.  If you are not better or start feeling worse, stop all medicines and call your doctor for advice LoMotil (Diphenoxylate / Atropine) is another medicine that can constipate & slow down bowel moevements Pepto Bismol (bismuth) can gently thicken bowels as well  If diarrhea is worse,: drink plenty of liquids and try simpler foods for a few days to avoid stressing your intestines further. Avoid dairy products (especially milk & ice cream) for a short time.  The intestines often can lose the ability to digest lactose when stressed. Avoid foods that cause gassiness or bloating.  Typical foods include beans and other legumes, cabbage, broccoli, and dairy foods.  Every person has some sensitivity to other foods, so listen  to our body and avoid those foods that trigger problems for you.Call your doctor if you are getting worse or not better.  Sometimes further testing (cultures, endoscopy, X-ray studies, bloodwork, etc) may be needed to help diagnose  and treat the cause of the diarrhea. Take extra anti-diarrheal medicines (maximum is 8 pills of 2mg  loperamide a day)  TROUBLESHOOTING IRREGULAR BOWELS 1) Avoid extremes of bowel movements (no bad constipation/diarrhea) 2) Miralax 17gm mixed in 8oz. water or juice-daily. May use BID as needed.  3) Gas-x,Phazyme, etc. as needed for gas & bloating.  4) Soft,bland diet. No spicy,greasy,fried foods.  5) Prilosec over-the-counter as needed  6) May hold gluten/wheat products from diet to see if symptoms improve.  7)  May try probiotics (Align, Activa, etc) to help calm the bowels down 7) If symptoms become worse call back immediately.

## 2023-04-19 NOTE — Plan of Care (Signed)

## 2023-04-19 NOTE — Progress Notes (Signed)
[facility] Progress Note  Patient: Jacqueline Mora     YNW:[id number]  DOA: 04/17/2023   PCP: Jarrett Soho, PA-C       Brief hospital course: This is a 67 year old female with morbid obesity, hypertension, recurrent ventral incisional incarcerated who underwent complicated laparoscopic repair of abdominal hernia with mesh and panniculectomy on 04/04/2023.  She presents to the hospital for a temperature of 103 degrees.   Interventional radiology placed bilateral subcutaneous abdominal drains on 8/29.  900 cc of fluid drained.  Subjective:  Having some mild pain in her abdomen but overall is relieved that her fevers have resolved.  Assessment and Plan: Principal Problem:   Fever postop -Secondary to fluid collection which may be infected -Gram stain showed moderate WBCs, predominantly PMN-no organisms -Wound culture now growing staph aureus - I favor continuing IV vancomycin and Maxipime for today and will reevaluating tomorrow to see if she can be transitioned to oral antibiotics -WBC improved from 17.2 to 9.7 and fever resolved -Lower extremity venous duplex negative for DVT  Active Problems:  Hypokalemia -Treated and resolved      Code Status: Full Code Total time on patient care: 35 min  Objective:   Vitals:   04/18/23 1753 04/18/23 2101 04/19/23 0518 04/19/23 1308  BP:  109/61 (!) 106/50 135/73  Pulse:  78 68 80  Resp:  16 16 16   Temp: 98.3 F (36.8 C) 97.8 F (36.6 C) 97.7 F (36.5 C) 98.3 F (36.8 C)  TempSrc: Oral Oral Oral Oral  SpO2:  97% 96% 100%  Weight:      Height:       Filed Weights   04/17/23 1925  Weight: 127 kg   Exam: General exam: Appears comfortable  HEENT: oral mucosa moist Respiratory system: Clear to auscultation.  Cardiovascular system: S1 & S2 heard  Gastrointestinal system: Abdomen soft, bilateral abdominal drains noted - incision across lower abdomen appears to be healing Extremities: No cyanosis, clubbing or  edema Psychiatry:  Mood & affect appropriate.      CBC: Recent Labs  Lab 04/17/23 1942 04/18/23 0343 04/19/23 0331  WBC 13.6* 17.2* 9.7  NEUTROABS 12.0*  --   --   HGB 11.2* 10.0* 9.0*  HCT 35.0* 32.7* 29.0*  MCV 90.9 93.2 94.5  PLT 330 305 226   Basic Metabolic Panel: Recent Labs  Lab 04/17/23 1942 04/18/23 0342 04/18/23 0343 04/19/23 0331  NA 132*  --  135 136  K 2.8*  --  3.1* 3.7  CL 92*  --  96* 102  CO2 26  --  25 25  GLUCOSE 119*  --  120* 118*  BUN 11  --  11 24*  CREATININE 0.98  --  1.24* 1.58*  CALCIUM 8.3*  --  7.9* 7.6*  MG  --  1.6*  --   --      Scheduled Meds:  acetaminophen  1,000 mg Oral Q6H   [START ON 04/20/2023] enoxaparin (LOVENOX) injection  40 mg Subcutaneous Q24H   ezetimibe  10 mg Oral Daily   levothyroxine  150 mcg Oral Q0600   polyethylene glycol  17 g Oral BID   potassium chloride  40 mEq Oral BID   psyllium  1 packet Oral BID   sodium chloride flush  5 mL Intracatheter Q8H   Continuous Infusions:  sodium chloride Stopped (04/19/23 1451)   ceFEPime (MAXIPIME) IV     lactated ringers     methocarbamol (ROBAXIN) IV     ondansetron (  ZOFRAN) IV     promethazine (PHENERGAN) injection (IM or IVPB) Stopped (04/18/23 1254)   vancomycin     Imaging and lab data was personally reviewed   Author: Calvert Cantor  04/19/2023 4:22 PM  To contact [facility]>   Check the care team in [facility] and look for the attending/consulting [facility] provider listed  Log into [email or web address] and use [facility] universal password   Go to> "[facility]"  and find provider  If you still have difficulty reaching the provider, please page the [facility] (Director on Call) for the Hospitalists listed on amion

## 2023-04-19 NOTE — Progress Notes (Signed)
Pharmacy Antibiotic Note  Jacqueline Mora is a 67 y.o. female admitted on 04/17/2023 with possible  wound infection .  Pharmacy has been consulted for vancomycin dosing.  Day 2 Cefepime/Vanc Rise in SCr Afebrile Improved WBC Abdominal Culture still pending  Plan: Due to rise in SCr, change cefepime to 2g IV q12 Also due to worsening renal function, change vanc from 1250mg  IV q24 to 1g IV q24 - goal AUC 400-550 See Dr. Gordy Savers note about infection, ? Discontinue antibiotics at this point  Height: 5' 5.5" (166.4 cm) Weight: 127 kg (280 lb) IBW/kg (Calculated) : 58.15  Temp (24hrs), Avg:98.6 F (37 C), Min:97.7 F (36.5 C), Max:100.8 F (38.2 C)  Recent Labs  Lab 04/17/23 1942 04/17/23 1955 04/18/23 0343 04/19/23 0331  WBC 13.6*  --  17.2* 9.7  CREATININE 0.98  --  1.24* 1.58*  LATICACIDVEN  --  1.5  --   --     Estimated Creatinine Clearance: 46.7 mL/min (A) (by C-G formula based on SCr of 1.58 mg/dL (H)).    Allergies  Allergen Reactions   Amlodipine Besylate Other (See Comments)    Dizziness   Eszopiclone     Other reaction(s): Crazy dreams   Tape     Adhesive on medical tape and glue burns skin - requires paper tape.   Zocor [Simvastatin]     myalgias   Clindamycin Hcl Rash   Meloxicam Rash   Methylprednisolone Rash   Penicillin G Rash      Thank you for allowing pharmacy to be a part of this patient's care.  Berkley Harvey 04/19/2023 8:03 AM

## 2023-04-19 NOTE — Progress Notes (Signed)
Bilateral lower extremity venous duplex has been completed. Preliminary results can be found in CV Proc through chart review.   04/19/23 10:04 AM Olen Cordial RVT

## 2023-04-19 NOTE — TOC Transition Note (Signed)
Transition of Care [facility]) - CM/SW Discharge Note   Patient Details  Name: Jacqueline Mora MRN: [id number] Date of Birth: Jul 18, 1956  [facility] CM/SW Contact:  Amada Jupiter, LCSW Phone Number: 04/19/2023, 2:24 PM   Clinical Narrative:    MD has placed order for [facility] to monitor JP drain in the home.  Pt aware and no agency preference - referral placed with [facility].  Have explained to pt that, typically, HHRN visits are 2-(maybe)3 times per week but will not be daily visits.  Encouraged her to learn care of the JP drain and RN notes teaching begun.  Pt with concerns about managing this on her own and states that she does not have anyone to assist her.  Have alerted medical team and RN following of patient concerns.  Unfortunately, I cannot secure a daily HHRN visit and ongoing education will be needed with patient.  No further TOC needs.  Please reorder if new needs arise.   Final next level of care: Home w Home Health Services Barriers to Discharge: Continued Medical Work up   Patient Goals and CMS Choice      Discharge Placement                         Discharge Plan and Services Additional resources added to the After Visit Summary for                  DME Arranged: N/A DME Agency: NA       HH Arranged: RN HH Agency: [facility] Date HH Agency Contacted: 04/19/23 Time HH Agency Contacted: 1424 Representative spoke with at [facility] Agency: Hassel Neth  Social Determinants of Health (SDOH) Interventions SDOH Screenings   Food Insecurity: No Food Insecurity (04/18/2023)  Housing: Low Risk  (04/18/2023)  Transportation Needs: No Transportation Needs (04/18/2023)  Utilities: Not At Risk (04/18/2023)  Tobacco Use: Medium Risk (04/17/2023)     Readmission Risk Interventions    04/19/2023    2:23 PM 04/05/2023    9:38 AM  Readmission Risk Prevention Plan  Transportation Screening Complete Complete  PCP or Specialist Appt within 5-7 Days   Complete  PCP or Specialist Appt within 3-5 Days Complete   Home Care Screening  Complete  Medication Review (RN CM)  Complete  HRI or Home Care Consult Complete   Social Work Consult for Recovery Care Planning/Counseling Complete   Palliative Care Screening Not Applicable   Medication Review Oceanographer) Complete

## 2023-04-19 NOTE — Progress Notes (Signed)
Referring Physician(s): Gross, S.   Supervising Physician: Gilmer Mor  Patient Status:  [facility] - In-pt  Chief Complaint:  S/p abdominal hernia with mesh and panniculectomy 04/04/23 Development of abdominal wall seroma  S/p drain placement by Dr. Lowella Dandy on 8/29   Subjective:  Patient sitting in a recliner, NAD.  Reports that she is feeling better today, she is worried that the culture/gram stain has not showed any bacteria so she does not know where the infection came from.  Informed the patient that culture result takes couple days, but she is correct that gram stain has not showed any organism.  Patient was informed that her drains are still putting out significant amount of output, she will be scheduled for f/u CT when output is less than 10 mL/day.  She verbalized understanding.   Allergies: Amlodipine besylate, Eszopiclone, Tape, Zocor [simvastatin], Clindamycin hcl, Meloxicam, Methylprednisolone, and Penicillin g  Medications: Prior to Admission medications   Medication Sig Start Date End Date Taking? Authorizing Provider  ALPRAZolam Prudy Feeler) 0.5 MG tablet Take 0.5 mg by mouth as needed for anxiety.   Yes [provider]  cholecalciferol (VITAMIN D) 1000 units tablet Take 1,000 Units by mouth 4 (four) times a week.   Yes [provider]  Cyanocobalamin (B-12 PO) Take 1 tablet by mouth 4 (four) times a week.   Yes [provider]  estradiol (ESTRACE) 1 MG tablet Take 1 mg by mouth daily.   Yes [provider]  ezetimibe (ZETIA) 10 MG tablet Take 10 mg by mouth daily. 02/25/23  Yes [provider]  FLUoxetine (PROZAC) 10 MG capsule Take 20 mg by mouth daily.   Yes [provider]  fluticasone (FLONASE) 50 MCG/ACT nasal spray Place 1 spray into both nostrils daily as needed for allergies. 12/12/16  Yes [provider]  furosemide (LASIX) 40 MG tablet TAKE 1 TABLET(40 MG TOTAL) BY MOUTH DAILY 07/09/22  Yes Chandrasekhar,  Mahesh A, MD  ketoconazole (NIZORAL) 2 % cream Apply 1 Application topically daily. 08/22/21  Yes [provider]  levothyroxine (SYNTHROID) 150 MCG tablet Take 150 mcg by mouth daily before breakfast.   Yes [provider]  losartan (COZAAR) 100 MG tablet Take 100 mg by mouth daily. 12/02/20  Yes [provider]  methocarbamol (ROBAXIN) 500 MG tablet Take 1-2 tablets (500-1,000 mg total) by mouth every 8 (eight) hours as needed for muscle spasms. 04/09/23  Yes Karie Soda, MD  ondansetron (ZOFRAN-ODT) 4 MG disintegrating tablet Take 4 mg by mouth every 8 (eight) hours as needed for nausea or vomiting. 04/15/23 04/22/23 Yes [provider]  oxyCODONE 10 MG TABS Take 1-1.5 tablets (10-15 mg total) by mouth every 6 (six) hours as needed for severe pain or breakthrough pain. 04/09/23  Yes Karie Soda, MD  RABEprazole (ACIPHEX) 20 MG tablet Take 20 mg by mouth daily.   Yes [provider]  spironolactone (ALDACTONE) 25 MG tablet TAKE ONE TABLET BY MOUTH DAILY DOSE INCREASE 10/08/22  Yes Chandrasekhar, Mahesh A, MD  rosuvastatin (CRESTOR) 10 MG tablet Take 1 tablet (10 mg total) by mouth daily. Patient not taking: Reported on 04/18/2023 02/20/23 05/21/23  Riley Lam A, MD  WEGOVY 0.25 MG/0.5ML SOAJ Inject 0.25 mg into the skin once a week. Patient not taking: Reported on 03/15/2023 02/20/23   Christell Constant, MD     Vital Signs: BP (!) 106/50 (BP Location: Right Arm)   Pulse 68   Temp 97.7 F (36.5 C) (Oral)  Resp 16   Ht 5' 5.5" (1.664 m)   Wt 280 lb (127 kg)   SpO2 96%   BMI 45.89 kg/m   Physical Exam Vitals reviewed.  Constitutional:      General: She is not in acute distress.    Appearance: She is obese. She is not ill-appearing.  HENT:     Head: Normocephalic.  Pulmonary:     Effort: Pulmonary effort is normal.  Abdominal:     Palpations: Abdomen is soft.     Comments: Positive RLQ drain to a suction bulb. Trace ml of   serosanguinous colored fluid noted in the bulb. Drain aspirates and flushes well. LLQ drain intact per RN.    Musculoskeletal:     Cervical back: Neck supple.  Skin:    General: Skin is warm and dry.     Coloration: Skin is not jaundiced or pale.  Neurological:     Mental Status: She is alert.  Psychiatric:        Mood and Affect: Mood normal.        Behavior: Behavior normal.     Imaging: [place] Abscess Drain  Result Date: 04/18/2023 INDICATION: 67 year old with large abdominal subcutaneous fluid collections following panniculectomy and repair of abdominal hernia. EXAM: 1. Placement of percutaneous drain in the right abdominal subcutaneous fluid collection 2. Placement of percutaneous drain in the left abdominal subcutaneous fluid collection MEDICATIONS: Moderate sedation ANESTHESIA/SEDATION: Moderate (conscious) sedation was employed during this procedure. A total of Versed 2mg  and fentanyl 100 mcg was administered intravenously at the order of the provider performing the procedure. Total intra-service moderate sedation time: 40 minutes. Patient's level of consciousness and vital signs were monitored continuously by radiology nurse throughout the procedure under the supervision of the provider performing the procedure. COMPLICATIONS: None immediate. PROCEDURE: Informed written consent was obtained from the patient after a thorough discussion of the procedural risks, benefits and alternatives. All questions were addressed. A timeout was performed prior to the initiation of the procedure. Both sides of the abdomen were obtained. Large subcutaneous fluid collections were identified near the surgical incisions on both sides of the abdomen. The left and right side of the abdomen were prepped with chlorhexidine and sterile field was created. Skin was anesthetized along the right side of the abdomen with 1% lidocaine and a small incision was made. Using ultrasound guidance, a Yueh catheter was directed into  the large subcutaneous fluid collection and serosanguineous fluid was aspirated. Superstiff Amplatz wire was advanced into the collection. The tract was dilated to accommodate a 12 [place] drain. Large amount of serosanguineous fluid was removed. At one point, the drain needed to be flush to continue aspirating fluid. Drain was attached to a suction bulb. Ultrasound demonstrated a large amount of residual subcutaneous fluid along the left side of the abdomen. Therefore, attention was directed to the left side of the abdominal subcutaneous fluid collection. The left side of the abdomen was anesthetized with 1% lidocaine and a small incision was made. Using ultrasound guidance, a Yueh catheter was directed into the subcutaneous fluid collection and serosanguineous fluid was aspirated. Superstiff Amplatz wire was advanced into the collection and the tract was dilated to accommodate a 12 [place] drain. Additional serosanguineous fluid was removed. Both drains were attached to suction bulbs. Sample sent for culture. Both drains were sutured to skin and sterile dressings were placed. FINDINGS: Large complex fluid collections in the anterior abdomen most prominent along the lateral aspects of the abdomen. Bilateral 12 French drains  were placed. Greater than 900 mL of serosanguineous fluid was removed from these collections. The subcutaneous fluid collections were markedly decreased by the end of the procedure. IMPRESSION: Ultrasound-guided placement of two abdominal subcutaneous drainage catheters. Greater than 900 mL of serosanguineous fluid was removed from the abdominal subcutaneous collections. Fluid was sent for culture. Electronically Signed   By: Richarda Overlie M.D.   On: 04/18/2023 16:39   [place] Abscess Drain  Result Date: 04/18/2023 INDICATION: 67 year old with large abdominal subcutaneous fluid collections following panniculectomy and repair of abdominal hernia. EXAM: 1. Placement of percutaneous drain in the right  abdominal subcutaneous fluid collection 2. Placement of percutaneous drain in the left abdominal subcutaneous fluid collection MEDICATIONS: Moderate sedation ANESTHESIA/SEDATION: Moderate (conscious) sedation was employed during this procedure. A total of Versed 2mg  and fentanyl 100 mcg was administered intravenously at the order of the provider performing the procedure. Total intra-service moderate sedation time: 40 minutes. Patient's level of consciousness and vital signs were monitored continuously by radiology nurse throughout the procedure under the supervision of the provider performing the procedure. COMPLICATIONS: None immediate. PROCEDURE: Informed written consent was obtained from the patient after a thorough discussion of the procedural risks, benefits and alternatives. All questions were addressed. A timeout was performed prior to the initiation of the procedure. Both sides of the abdomen were obtained. Large subcutaneous fluid collections were identified near the surgical incisions on both sides of the abdomen. The left and right side of the abdomen were prepped with chlorhexidine and sterile field was created. Skin was anesthetized along the right side of the abdomen with 1% lidocaine and a small incision was made. Using ultrasound guidance, a Yueh catheter was directed into the large subcutaneous fluid collection and serosanguineous fluid was aspirated. Superstiff Amplatz wire was advanced into the collection. The tract was dilated to accommodate a 12 [place] drain. Large amount of serosanguineous fluid was removed. At one point, the drain needed to be flush to continue aspirating fluid. Drain was attached to a suction bulb. Ultrasound demonstrated a large amount of residual subcutaneous fluid along the left side of the abdomen. Therefore, attention was directed to the left side of the abdominal subcutaneous fluid collection. The left side of the abdomen was anesthetized with 1% lidocaine and a small  incision was made. Using ultrasound guidance, a Yueh catheter was directed into the subcutaneous fluid collection and serosanguineous fluid was aspirated. Superstiff Amplatz wire was advanced into the collection and the tract was dilated to accommodate a 12 [place] drain. Additional serosanguineous fluid was removed. Both drains were attached to suction bulbs. Sample sent for culture. Both drains were sutured to skin and sterile dressings were placed. FINDINGS: Large complex fluid collections in the anterior abdomen most prominent along the lateral aspects of the abdomen. Bilateral 12 French drains were placed. Greater than 900 mL of serosanguineous fluid was removed from these collections. The subcutaneous fluid collections were markedly decreased by the end of the procedure. IMPRESSION: Ultrasound-guided placement of two abdominal subcutaneous drainage catheters. Greater than 900 mL of serosanguineous fluid was removed from the abdominal subcutaneous collections. Fluid was sent for culture. Electronically Signed   By: Richarda Overlie M.D.   On: 04/18/2023 16:39   CT ABDOMEN PELVIS W CONTRAST  Result Date: 04/17/2023 CLINICAL DATA:  Ventral hernia open repair 2 weeks ago with mesh replacement, lower abdominal pain EXAM: CT ABDOMEN AND PELVIS WITH CONTRAST TECHNIQUE: Multidetector CT imaging of the abdomen and pelvis was performed using the standard protocol following bolus administration  of intravenous contrast. RADIATION DOSE REDUCTION: This exam was performed according to the departmental dose-optimization program which includes automated exposure control, adjustment of the mA and/or kV according to patient size and/or use of iterative reconstruction technique. CONTRAST:  OMNIPAQUE IOHEXOL 300 MG/ML  SOLN COMPARISON:  01/04/2023 FINDINGS: Lower chest: Small type 1 hiatal hernia. Hepatobiliary: Unremarkable Pancreas: Unremarkable Spleen: Unremarkable Adrenals/Urinary Tract: Unremarkable Stomach/Bowel: Mast  Modic staple line in the right small bowel (image 56, series 2) and in the rectum (image 74 series 2.). Normal appendix. No dilated bowel were substantially abnormal thick-walled bowel. Vascular/Lymphatic: Unremarkable Reproductive: Uterus absent.  Adnexa unremarkable. Other: No ascites. Musculoskeletal: The dominant complex ventral hernia has been repaired with mesh in place. There is edema and stranding along the mesh, not necessarily unexpected in this postoperative stage, there is also greater than expected nodularity in the omentum cephalad to the mesh for example on images 27 through 38 of series 2. Some of this may be from omental edema but the possibility of omental infarct or infectious process along the omentum is not totally excluded. Do not see any gas to indicate an abscess in this region. There were two previous upper abdominal hernias containing adipose tissue, the most cephalad has mildly enlarged with herniated adipose tissue currently measuring 4.4 by 2.0 by 2.9 cm. The more caudad, which is probably covered by the mesh, now presents as small lobulations of fluid density in the subcutaneous tissues at the site of prior hernia for example on images 41 through 48 of series 4. I am uncertain if this represents residual hernia sac containing fluid versus simply some subcutaneous nodularity. There is a large tubular fluid density process along the pannus along with reduction in volume of pannus, compatible with panniculectomy. Surrounding subcutaneous stranding is present. Miniscule loculation of gas along the right margin of this panniculectomy site on image 52 series 2, much less than 1 cc, possibly incidental. I do not see abnormal enhancement along the margins of the panniculectomy site to indicate an abscess. Correlate with quality of drainage at the wound site, if any. Lower lumbar spondylosis and degenerative disc disease causing right foraminal impingement at L4-5 and left foraminal impingement  at L5-S1. Mildly transitional S1 vertebra. IMPRESSION: 1. Interval panniculectomy with a large tubular fluid density process along the pannus, but no rim enhancement to indicate abscess. Miniscule loculation of gas along the right margin of this panniculectomy site, much less than 1 cc, possibly incidental. Correlate with quality of drainage at the wound site, if any. 2. Interval repair of the dominant complex ventral hernia with mesh in place. There is edema and stranding along the mesh, not necessarily unexpected in this postoperative stage, but there is also greater than expected nodularity in the omentum cephalad to the mesh. Some of this may be from omental edema but the possibility of omental infarct or infectious process along the upper omentum is not totally excluded. 3. There were two previous upper abdominal hernias containing adipose tissue, the most cephalad has mildly enlarged and at the site of the more caudad there is lobular fluid density in the subcutaneous tissues. 4. Small type 1 hiatal hernia. 5. Lower lumbar spondylosis and degenerative disc disease causing right foraminal impingement at L4-5 and left foraminal impingement at L5-S1. Electronically Signed   By: Gaylyn Rong M.D.   On: 04/17/2023 21:26   DG Chest Port 1 View  Result Date: 04/17/2023 CLINICAL DATA:  Sepsis, recent hernia repair EXAM: PORTABLE CHEST 1 VIEW COMPARISON:  04/06/2023 FINDINGS: Single frontal view of the chest demonstrates an unremarkable cardiac silhouette. Improved aeration at the lung bases. No airspace disease, effusion, or pneumothorax. No acute bony abnormalities. IMPRESSION: 1. No acute intrathoracic process. Electronically Signed   By: Sharlet Salina M.D.   On: 04/17/2023 20:07    Labs:  CBC: Recent Labs    04/09/23 0445 04/17/23 1942 04/18/23 0343 04/19/23 0331  WBC 7.1 13.6* 17.2* 9.7  HGB 9.4* 11.2* 10.0* 9.0*  HCT 29.6* 35.0* 32.7* 29.0*  PLT 271 330 305 226    COAGS: Recent Labs     03/27/23 1323 04/17/23 1942  INR 1.0 1.0  APTT 27 31    BMP: Recent Labs    04/07/23 0448 04/09/23 0445 04/17/23 1942 04/18/23 0343 04/19/23 0331  NA 134*  --  132* 135 136  K 4.1 3.9 2.8* 3.1* 3.7  CL 105  --  92* 96* 102  CO2 20*  --  26 25 25   GLUCOSE 96  --  119* 120* 118*  BUN 22  --  11 11 24*  CALCIUM 8.2*  --  8.3* 7.9* 7.6*  CREATININE 1.63* 0.83 0.98 1.24* 1.58*  GFRNONAA 34* >60 >60 48* 36*    LIVER FUNCTION TESTS: Recent Labs    04/17/23 1942 04/18/23 0343  BILITOT 1.1 1.0  AST 22 18  ALT 17 14  ALKPHOS 88 68  PROT 6.7 5.4*  ALBUMIN 3.5 2.8*    Assessment and Plan:  67 y.o. female with numerous abdominal operations for recurrent periumbilical incisional hernias, s/p abdominal hernia with mesh and panniculectomy 04/04/23, found to have abdominal wall fluid collection along with abd pain, tachycardia and leokocytosis , S/p aspiration and drain placement by Dr. Lowella Dandy on 8/29  - removed 900 mL of serosanguinous fluid during the procedure   VSS afebrile  Leukocytosis resolved  Overnight output RLQ 793 mL, LLQ 296 mL  Cx pending gram stain neg  Output serosanguinous   Drain #1 Location: RLQ Size: Fr size: 12 Fr Date of placement: 8/29  Currently to: Drain collection device: suction bulb  Drain #1 Location: Left hip, LLQ Size: Fr size: 12 Fr Date of placement: 8/29  Currently to: Drain collection device: suction bulb 24 hour output:  Output by Drain (mL) 04/17/23 0701 - 04/17/23 1900 04/17/23 1901 - 04/18/23 0700 04/18/23 0701 - 04/18/23 1900 04/18/23 1901 - 04/19/23 0700 04/19/23 0701 - 04/19/23 0914  Closed System Drain 1 Lateral RLQ 12 Fr.   789 4   Closed System Drain   200    Closed System Drain 2 Left Hip 12 Fr.   123 173   Negative Pressure Wound Therapy Abdomen Anterior;Mid         Interval imaging/drain manipulation:  None   Current examination: Flushes/aspirates easily.  Insertion site unremarkable. Suture and stat lock in  place. Dressed appropriately.   Plan: Continue TID flushes with 5 cc NS. Record output Q shift. Dressing changes QD or PRN if soiled.  Call IR APP or on call IR MD if difficulty flushing or sudden change in drain output.  Repeat imaging/possible drain injection once output < 10 mL/QD (excluding flush material). Consideration for drain removal if output is < 10 mL/QD (excluding flush material), pending discussion with the providing surgical service.  Discharge planning: Please contact IR APP or on call IR MD prior to patient d/c to ensure appropriate follow up plans are in place. Typically patient will follow up with IR clinic 10-14 days post d/c  for repeat imaging/possible drain injection. IR scheduler will contact patient with date/time of appointment. Patient will need to flush drain QD with 5 cc NS, record output QD, dressing changes every 2-3 days or earlier if soiled.   IR will continue to follow - please call with questions or concerns.   Electronically Signed: Willette Brace, PA-C 04/19/2023, 9:10 AM   I spent a total of 15 Minutes at the the patient's bedside AND on the patient's hospital floor or unit, greater than 50% of which was counseling/coordinating care for abdominal wall fluid collection drain f/u.   This chart was dictated using voice recognition software.  Despite best efforts to proofread,  errors can occur which can change the documentation meaning.

## 2023-04-20 DIAGNOSIS — R5082 Postprocedural fever: Secondary | ICD-10-CM | POA: Diagnosis not present

## 2023-04-20 LAB — CBC
HCT: 26.7 % — ABNORMAL LOW (ref 36.0–46.0)
Hemoglobin: 8.2 g/dL — ABNORMAL LOW (ref 12.0–15.0)
MCH: 28.9 pg (ref 26.0–34.0)
MCHC: 30.7 g/dL (ref 30.0–36.0)
MCV: 94 fL (ref 80.0–100.0)
Platelets: 233 10*3/uL (ref 150–400)
RBC: 2.84 MIL/uL — ABNORMAL LOW (ref 3.87–5.11)
RDW: 16.3 % — ABNORMAL HIGH (ref 11.5–15.5)
WBC: 6.7 10*3/uL (ref 4.0–10.5)
nRBC: 0 % (ref 0.0–0.2)

## 2023-04-20 LAB — BASIC METABOLIC PANEL
Anion gap: 10 (ref 5–15)
BUN: 16 mg/dL (ref 8–23)
CO2: 21 mmol/L — ABNORMAL LOW (ref 22–32)
Calcium: 8 mg/dL — ABNORMAL LOW (ref 8.9–10.3)
Chloride: 105 mmol/L (ref 98–111)
Creatinine, Ser: 0.83 mg/dL (ref 0.44–1.00)
GFR, Estimated: >60 mL/min (ref >60)
Glucose, Bld: 89 mg/dL (ref 70–99)
Potassium: 3.9 mmol/L (ref 3.5–5.1)
Sodium: 136 mmol/L (ref 135–145)

## 2023-04-20 MED ORDER — FLUTICASONE PROPIONATE 50 MCG/ACT NA SUSP: 1.0000 | Freq: Every day | NASAL | Status: DC | PRN

## 2023-04-20 MED ORDER — METHOCARBAMOL 500 MG PO TABS
1000.0000 mg | ORAL_TABLET | Freq: Four times a day (QID) | ORAL | Status: DC
  Administered 2023-04-20: 1000 mg via ORAL
  Filled 2023-04-20 (×4): qty 2

## 2023-04-20 MED ORDER — ALPRAZOLAM 0.5 MG PO TABS
0.5000 mg | ORAL_TABLET | ORAL | Status: DC | PRN
  Filled 2023-04-20: qty 1

## 2023-04-20 MED ORDER — FLUOXETINE HCL 20 MG PO CAPS
20.0000 mg | ORAL_CAPSULE | Freq: Every day | ORAL | Status: DC
  Administered 2023-04-20 – 2023-04-23 (×4): 20 mg via ORAL
  Filled 2023-04-20 (×4): qty 1

## 2023-04-20 MED ORDER — SODIUM CHLORIDE 0.9 % IV SOLN
2.0000 g | Freq: Three times a day (TID) | INTRAVENOUS | Status: DC
  Administered 2023-04-21 (×2): 2 g via INTRAVENOUS
  Filled 2023-04-20 (×2): qty 12.5

## 2023-04-20 MED ORDER — FUROSEMIDE 20 MG PO TABS
20.0000 mg | ORAL_TABLET | Freq: Every day | ORAL | Status: DC
  Administered 2023-04-20 – 2023-04-21 (×2): 20 mg via ORAL
  Filled 2023-04-20 (×3): qty 1

## 2023-04-20 MED ORDER — SPIRONOLACTONE 25 MG PO TABS
25.0000 mg | ORAL_TABLET | Freq: Every day | ORAL | Status: DC
  Administered 2023-04-20 – 2023-04-23 (×4): 25 mg via ORAL
  Filled 2023-04-20 (×4): qty 1

## 2023-04-20 MED ORDER — VANCOMYCIN HCL 1250 MG/250ML IV SOLN
1250.0000 mg | INTRAVENOUS | Status: DC
  Administered 2023-04-20: 1250 mg via INTRAVENOUS
  Filled 2023-04-20 (×2): qty 250

## 2023-04-20 MED ORDER — SODIUM CHLORIDE 0.9 % IV SOLN: INTRAVENOUS | Status: DC | PRN

## 2023-04-20 NOTE — Progress Notes (Signed)
Pharmacy Antibiotic Note  Jacqueline Mora is a 67 y.o. female admitted on 04/17/2023 with possible  wound infection .  Pharmacy has been consulted for vancomycin dosing.  Scr has been fluctuating throughout admission: 0.98 > 1.24 > 1.58 > 0.83  Today, 04/20/23  - Scr = 0.83    Plan: - Increase Vancomycin dose to 1.25gm IV every 24 hours. (Round Scr to 1 for dosing) - Increase Cefepime to 2gm IV every 8 hours - goal AUC 400-550 - Follow up renal function, culture results, and clinical course.   Height: 5' 5.5" (166.4 cm) Weight: 127 kg (280 lb) IBW/kg (Calculated) : 58.15  Temp (24hrs), Avg:98.3 F (36.8 C), Min:97.8 F (36.6 C), Max:99.1 F (37.3 C)  Recent Labs  Lab 04/17/23 1942 04/17/23 1955 04/18/23 0343 04/19/23 0331 04/20/23 0325  WBC 13.6*  --  17.2* 9.7 6.7  CREATININE 0.98  --  1.24* 1.58* 0.83  LATICACIDVEN  --  1.5  --   --   --     Estimated Creatinine Clearance: 89 mL/min (by C-G formula based on SCr of 0.83 mg/dL).    Allergies  Allergen Reactions   Amlodipine Besylate Other (See Comments)    Dizziness   Eszopiclone     Other reaction(s): Crazy dreams   Tape     Adhesive on medical tape and glue burns skin - requires paper tape.   Zocor [Simvastatin]     myalgias   Clindamycin Hcl Rash   Meloxicam Rash   Methylprednisolone Rash   Penicillin G Rash    Antimicrobials this admission: 8/28 Cefepime >>  8/28 Vancomycin  >>    Microbiology results: 8/29 Serous Fluid abdomen wound:  >> Staph aureus (few)- susceptibilities pending 8/28 BCx: ngtd   Thank you for allowing pharmacy to be a part of this patient's care.  Si Jachim Tylene Fantasia 04/20/2023 6:25 PM

## 2023-04-20 NOTE — Progress Notes (Signed)
Subjective/Chief Complaint: Complains of severe tightness and pain starting in her upper abdomen and traveling to the lower abdomen.  Early satiety and some nausea.  Passing flatus and having bowel movements.   Objective: Vital signs in last 24 hours: Temp:  [98 F (36.7 C)-99.3 F (37.4 C)] 98.1 F (36.7 C) (08/31 0539) Pulse Rate:  [71-82] 71 (08/31 0539) Resp:  [16-18] 17 (08/31 0539) BP: (129-157)/(57-73) 129/64 (08/31 0539) SpO2:  [97 %-100 %] 97 % (08/31 0539) Last BM Date : 04/17/23  Intake/Output from previous day: 08/30 0701 - 08/31 0700 In: 4726.3 [P.O.:1380; I.V.:2045.9; IV Piggyback:1275.4] Out: 1186 [Urine:900; Drains:286] Intake/Output this shift: No intake/output data recorded.  Alert, well-appearing Unlabored respirations Abdomen is distended, slightly firm, appropriately tender.  Incisions clean, dry and intact.  Drain output is murky serosanguineous.  Lab Results:  Recent Labs    04/19/23 0331 04/20/23 0325  WBC 9.7 6.7  HGB 9.0* 8.2*  HCT 29.0* 26.7*  PLT 226 233   BMET Recent Labs    04/19/23 0331 04/20/23 0325  NA 136 136  K 3.7 3.9  CL 102 105  CO2 25 21*  GLUCOSE 118* 89  BUN 24* 16  CREATININE 1.58* 0.83  CALCIUM 7.6* 8.0*   PT/INR Recent Labs    04/17/23 1942  LABPROT 13.4  INR 1.0   ABG No results for input(s): "PHART", "HCO3" in the last 72 hours.  Invalid input(s): "PCO2", "PO2"  Studies/Results: VAS [place] LOWER EXTREMITY VENOUS (DVT)  Result Date: 04/19/2023  Lower Venous DVT Study Patient Name:  Jacqueline Mora  Date of Exam:   04/19/2023 Medical Rec #: [id number]        Accession #:    [id number] Date of Birth: 1956/05/17       Patient Gender: F Patient Age:   33 years Exam Location:  [facility] Procedure:      VAS [place] LOWER EXTREMITY VENOUS (DVT) Referring Phys: [facility] --------------------------------------------------------------------------------  Indications: Edema.  Risk Factors: None identified  Surgery. Limitations: Poor ultrasound/tissue interface. Comparison Study: No prior studies. Performing Technologist: Chanda Busing RVT  Examination Guidelines: A complete evaluation includes B-mode imaging, spectral Doppler, color Doppler, and power Doppler as needed of all accessible portions of each vessel. Bilateral testing is considered an integral part of a complete examination. Limited examinations for reoccurring indications may be performed as noted. The reflux portion of the exam is performed with the patient in reverse Trendelenburg.  +---------+---------------+---------+-----------+----------+--------------+ RIGHT    CompressibilityPhasicitySpontaneityPropertiesThrombus Aging +---------+---------------+---------+-----------+----------+--------------+ CFV      Full           Yes      Yes                                 +---------+---------------+---------+-----------+----------+--------------+ SFJ      Full                                                        +---------+---------------+---------+-----------+----------+--------------+ FV Prox  Full                                                        +---------+---------------+---------+-----------+----------+--------------+  FV Mid   Full                                                        +---------+---------------+---------+-----------+----------+--------------+ FV DistalFull                                                        +---------+---------------+---------+-----------+----------+--------------+ PFV      Full                                                        +---------+---------------+---------+-----------+----------+--------------+ POP      Full           Yes      Yes                                 +---------+---------------+---------+-----------+----------+--------------+ PTV      Full                                                         +---------+---------------+---------+-----------+----------+--------------+ PERO     Full                                                        +---------+---------------+---------+-----------+----------+--------------+   +---------+---------------+---------+-----------+----------+--------------+ LEFT     CompressibilityPhasicitySpontaneityPropertiesThrombus Aging +---------+---------------+---------+-----------+----------+--------------+ CFV      Full           Yes      Yes                                 +---------+---------------+---------+-----------+----------+--------------+ SFJ      Full                                                        +---------+---------------+---------+-----------+----------+--------------+ FV Prox  Full                                                        +---------+---------------+---------+-----------+----------+--------------+ FV Mid   Full                                                        +---------+---------------+---------+-----------+----------+--------------+  FV DistalFull                                                        +---------+---------------+---------+-----------+----------+--------------+ PFV      Full                                                        +---------+---------------+---------+-----------+----------+--------------+ POP      Full           Yes      Yes                                 +---------+---------------+---------+-----------+----------+--------------+ PTV      Full                                                        +---------+---------------+---------+-----------+----------+--------------+ PERO     Full                                                        +---------+---------------+---------+-----------+----------+--------------+     Summary: RIGHT: - There is no evidence of deep vein thrombosis in the lower extremity.  - No cystic structure found in  the popliteal fossa.  LEFT: - There is no evidence of deep vein thrombosis in the lower extremity.  - No cystic structure found in the popliteal fossa.  *See table(s) above for measurements and observations. Electronically signed by Lemar Livings MD on 04/19/2023 at 7:09:46 PM.    Final    [place] Abscess Drain  Result Date: 04/18/2023 INDICATION: 67 year old with large abdominal subcutaneous fluid collections following panniculectomy and repair of abdominal hernia. EXAM: 1. Placement of percutaneous drain in the right abdominal subcutaneous fluid collection 2. Placement of percutaneous drain in the left abdominal subcutaneous fluid collection MEDICATIONS: Moderate sedation ANESTHESIA/SEDATION: Moderate (conscious) sedation was employed during this procedure. A total of Versed 2mg  and fentanyl 100 mcg was administered intravenously at the order of the provider performing the procedure. Total intra-service moderate sedation time: 40 minutes. Patient's level of consciousness and vital signs were monitored continuously by radiology nurse throughout the procedure under the supervision of the provider performing the procedure. COMPLICATIONS: None immediate. PROCEDURE: Informed written consent was obtained from the patient after a thorough discussion of the procedural risks, benefits and alternatives. All questions were addressed. A timeout was performed prior to the initiation of the procedure. Both sides of the abdomen were obtained. Large subcutaneous fluid collections were identified near the surgical incisions on both sides of the abdomen. The left and right side of the abdomen were prepped with chlorhexidine and sterile field was created. Skin was anesthetized along the right side of the abdomen with 1% lidocaine and a small incision was made. Using ultrasound guidance, a Yueh catheter was  directed into the large subcutaneous fluid collection and serosanguineous fluid was aspirated. Superstiff Amplatz wire was advanced  into the collection. The tract was dilated to accommodate a 12 [place] drain. Large amount of serosanguineous fluid was removed. At one point, the drain needed to be flush to continue aspirating fluid. Drain was attached to a suction bulb. Ultrasound demonstrated a large amount of residual subcutaneous fluid along the left side of the abdomen. Therefore, attention was directed to the left side of the abdominal subcutaneous fluid collection. The left side of the abdomen was anesthetized with 1% lidocaine and a small incision was made. Using ultrasound guidance, a Yueh catheter was directed into the subcutaneous fluid collection and serosanguineous fluid was aspirated. Superstiff Amplatz wire was advanced into the collection and the tract was dilated to accommodate a 12 [place] drain. Additional serosanguineous fluid was removed. Both drains were attached to suction bulbs. Sample sent for culture. Both drains were sutured to skin and sterile dressings were placed. FINDINGS: Large complex fluid collections in the anterior abdomen most prominent along the lateral aspects of the abdomen. Bilateral 12 French drains were placed. Greater than 900 mL of serosanguineous fluid was removed from these collections. The subcutaneous fluid collections were markedly decreased by the end of the procedure. IMPRESSION: Ultrasound-guided placement of two abdominal subcutaneous drainage catheters. Greater than 900 mL of serosanguineous fluid was removed from the abdominal subcutaneous collections. Fluid was sent for culture. Electronically Signed   By: Richarda Overlie M.D.   On: 04/18/2023 16:39   [place] Abscess Drain  Result Date: 04/18/2023 INDICATION: 67 year old with large abdominal subcutaneous fluid collections following panniculectomy and repair of abdominal hernia. EXAM: 1. Placement of percutaneous drain in the right abdominal subcutaneous fluid collection 2. Placement of percutaneous drain in the left abdominal subcutaneous fluid  collection MEDICATIONS: Moderate sedation ANESTHESIA/SEDATION: Moderate (conscious) sedation was employed during this procedure. A total of Versed 2mg  and fentanyl 100 mcg was administered intravenously at the order of the provider performing the procedure. Total intra-service moderate sedation time: 40 minutes. Patient's level of consciousness and vital signs were monitored continuously by radiology nurse throughout the procedure under the supervision of the provider performing the procedure. COMPLICATIONS: None immediate. PROCEDURE: Informed written consent was obtained from the patient after a thorough discussion of the procedural risks, benefits and alternatives. All questions were addressed. A timeout was performed prior to the initiation of the procedure. Both sides of the abdomen were obtained. Large subcutaneous fluid collections were identified near the surgical incisions on both sides of the abdomen. The left and right side of the abdomen were prepped with chlorhexidine and sterile field was created. Skin was anesthetized along the right side of the abdomen with 1% lidocaine and a small incision was made. Using ultrasound guidance, a Yueh catheter was directed into the large subcutaneous fluid collection and serosanguineous fluid was aspirated. Superstiff Amplatz wire was advanced into the collection. The tract was dilated to accommodate a 12 [place] drain. Large amount of serosanguineous fluid was removed. At one point, the drain needed to be flush to continue aspirating fluid. Drain was attached to a suction bulb. Ultrasound demonstrated a large amount of residual subcutaneous fluid along the left side of the abdomen. Therefore, attention was directed to the left side of the abdominal subcutaneous fluid collection. The left side of the abdomen was anesthetized with 1% lidocaine and a small incision was made. Using ultrasound guidance, a Yueh catheter was directed into the subcutaneous fluid collection and  serosanguineous fluid  was aspirated. Superstiff Amplatz wire was advanced into the collection and the tract was dilated to accommodate a 12 [place] drain. Additional serosanguineous fluid was removed. Both drains were attached to suction bulbs. Sample sent for culture. Both drains were sutured to skin and sterile dressings were placed. FINDINGS: Large complex fluid collections in the anterior abdomen most prominent along the lateral aspects of the abdomen. Bilateral 12 French drains were placed. Greater than 900 mL of serosanguineous fluid was removed from these collections. The subcutaneous fluid collections were markedly decreased by the end of the procedure. IMPRESSION: Ultrasound-guided placement of two abdominal subcutaneous drainage catheters. Greater than 900 mL of serosanguineous fluid was removed from the abdominal subcutaneous collections. Fluid was sent for culture. Electronically Signed   By: Richarda Overlie M.D.   On: 04/18/2023 16:39    Anti-infectives: Anti-infectives (From admission, onward)    Start     Dose/Rate Route Frequency Ordered Stop   04/19/23 2200  vancomycin (VANCOCIN) IVPB 1000 mg/200 mL premix        1,000 mg 200 mL/hr over 60 Minutes Intravenous Every 24 hours 04/19/23 0808     04/19/23 1800  ceFEPIme (MAXIPIME) 2 g in sodium chloride 0.9 % 100 mL IVPB        2 g 200 mL/hr over 30 Minutes Intravenous Every 12 hours 04/19/23 0808     04/18/23 2200  vancomycin (VANCOREADY) IVPB 1250 mg/250 mL  Status:  Discontinued        1,250 mg 166.7 mL/hr over 90 Minutes Intravenous Every 24 hours 04/18/23 1233 04/19/23 0808   04/18/23 1500  ceFAZolin (ANCEF) IVPB 2g/100 mL premix  Status:  Discontinued        2 g 200 mL/hr over 30 Minutes Intravenous Every 8 hours 04/18/23 1127 04/18/23 1314   04/18/23 1415  ceFEPIme (MAXIPIME) 2 g in sodium chloride 0.9 % 100 mL IVPB  Status:  Discontinued        2 g 200 mL/hr over 30 Minutes Intravenous Every 8 hours 04/18/23 1318 04/19/23 0808    04/18/23 1400  ceFEPIme (MAXIPIME) 2 g in sodium chloride 0.9 % 100 mL IVPB  Status:  Discontinued        2 g 200 mL/hr over 30 Minutes Intravenous Every 8 hours 04/18/23 1027 04/18/23 1127   04/18/23 1045  piperacillin-tazobactam (ZOSYN) IVPB 3.375 g  Status:  Discontinued        3.375 g 12.5 mL/hr over 240 Minutes Intravenous Every 8 hours 04/18/23 0958 04/18/23 0959   04/18/23 1045  ceFEPIme (MAXIPIME) 2 g in sodium chloride 0.9 % 100 mL IVPB  Status:  Discontinued        2 g 200 mL/hr over 30 Minutes Intravenous Every 8 hours 04/18/23 0959 04/18/23 1027   04/18/23 1045  metroNIDAZOLE (FLAGYL) IVPB 500 mg  Status:  Discontinued        500 mg 100 mL/hr over 60 Minutes Intravenous Every 12 hours 04/18/23 0959 04/18/23 1127   04/18/23 0600  ceFEPIme (MAXIPIME) 2 g in sodium chloride 0.9 % 100 mL IVPB        2 g 200 mL/hr over 30 Minutes Intravenous  Once 04/18/23 0513 04/18/23 0729   04/17/23 2045  vancomycin (VANCOREADY) IVPB 2000 mg/400 mL        2,000 mg 200 mL/hr over 120 Minutes Intravenous  Once 04/17/23 2030 04/17/23 2317   04/17/23 2045  ceFEPIme (MAXIPIME) 2 g in sodium chloride 0.9 % 100 mL IVPB  2 g 200 mL/hr over 30 Minutes Intravenous  Once 04/17/23 2036 04/17/23 2143   04/17/23 2045  metroNIDAZOLE (FLAGYL) IVPB 500 mg        500 mg 100 mL/hr over 60 Minutes Intravenous  Once 04/17/23 2036 04/17/23 2250       Assessment/Plan: 67 year old woman who is status post laparoscopic recurrent ventral hernia repair with panniculectomy on 8/15 by Dr. Michaell Cowing Now status post seroma drainage, still having a good bit of pain.  Will modify pain regimen, add home Xanax.  Continue mobilizing as tolerated.   LOS: 2 days    Jacqueline Mora 04/20/2023

## 2023-04-20 NOTE — Progress Notes (Signed)
[facility] Progress Note  Patient: Jacqueline Mora     NWG:[id number]  DOA: 04/17/2023   PCP: Jarrett Soho, PA-C       Brief hospital course: This is a 67 year old female with morbid obesity, hypertension, recurrent ventral incisional incarcerated who underwent complicated laparoscopic repair of abdominal hernia with mesh and panniculectomy on 04/04/2023.  She presents to the hospital with temperature of 103 degrees.  Interventional radiology placed bilateral subcutaneous abdominal drains on 04/18/2023.  900 cc of fluid drained.  04/20/2023: No fever documented.  Temperature of 98.1.  Blood pressure stable.  Pertinent results from lab work done today revealed hemoglobin of 8.2, hematocrit of 26.7.  Doppler ultrasound of lower extremities is negative for DVT.  Cultures are negative to date.  Will continue to follow culture results.  Patient remains on IV cefepime and vancomycin.  Subjective:  No fever or chills endorsed.    Assessment and Plan: Principal Problem: Fever, postop: -Etiology unclear. -No fever over the last 24 hours. -Cultures are pending. -Patient is currently on IV vancomycin and cefepime. -Incentive spirometry. -Leukocytosis has resolved.  Secondary to -Fluid collection is noted.  -Gram stain showed moderate WBCs, predominantly PMN-no organisms -Wound culture now growing few staph aureus, will follow final culture results. -WBC today 6.7 (down from 17.2).   -Lower extremity venous duplex negative for DVT  Active Problems: Hypokalemia: -Treated and resolved -Potassium is 3.9 today.      Code Status: Full Code Total time on patient care: 35 min  Objective:   Vitals:   04/19/23 1820 04/19/23 2118 04/20/23 0250 04/20/23 0539  BP:  (!) 141/57 (!) 157/73 129/64  Pulse:  82 72 71  Resp:  17 18 17   Temp: 99.3 F (37.4 C) 99.1 F (37.3 C) 98 F (36.7 C) 98.1 F (36.7 C)  TempSrc: Oral Oral Oral Oral  SpO2:  97% 98% 97%  Weight:      Height:        Filed Weights   04/17/23 1925  Weight: 127 kg   Exam: General exam: Appears comfortable.  Patient is morbidly obese. HEENT: Mild pallor.  No jaundice.   Respiratory system: Clear to auscultation.  Cardiovascular system: S1 & S2 heard  Gastrointestinal system: Abdomen is obese, soft, bilateral abdominal drains noted - incision across lower abdomen appears to be healing Extremities: No edema   CBC: Recent Labs  Lab 04/17/23 1942 04/18/23 0343 04/19/23 0331 04/20/23 0325  WBC 13.6* 17.2* 9.7 6.7  NEUTROABS 12.0*  --   --   --   HGB 11.2* 10.0* 9.0* 8.2*  HCT 35.0* 32.7* 29.0* 26.7*  MCV 90.9 93.2 94.5 94.0  PLT 330 305 226 233   Basic Metabolic Panel: Recent Labs  Lab 04/17/23 1942 04/18/23 0342 04/18/23 0343 04/19/23 0331 04/20/23 0325  NA 132*  --  135 136 136  K 2.8*  --  3.1* 3.7 3.9  CL 92*  --  96* 102 105  CO2 26  --  25 25 21*  GLUCOSE 119*  --  120* 118* 89  BUN 11  --  11 24* 16  CREATININE 0.98  --  1.24* 1.58* 0.83  CALCIUM 8.3*  --  7.9* 7.6* 8.0*  MG  --  1.6*  --   --   --      Scheduled Meds:  acetaminophen  1,000 mg Oral Q6H   enoxaparin (LOVENOX) injection  40 mg Subcutaneous Q24H   ezetimibe  10 mg Oral Daily   FLUoxetine  20 mg Oral Daily   furosemide  20 mg Oral Daily   levothyroxine  150 mcg Oral Q0600   methocarbamol  1,000 mg Oral QID   polyethylene glycol  17 g Oral BID   potassium chloride  40 mEq Oral BID   psyllium  1 packet Oral BID   sodium chloride flush  5 mL Intracatheter Q8H   spironolactone  25 mg Oral Daily   Continuous Infusions:  ceFEPime (MAXIPIME) IV Stopped (04/20/23 0629)   ondansetron (ZOFRAN) IV     promethazine (PHENERGAN) injection (IM or IVPB) Stopped (04/18/23 1254)   vancomycin 1,000 mg (04/19/23 2124)   Imaging and lab data was personally reviewed   Author: Barnetta Chapel  04/20/2023 12:05 PM  To contact [facility]>   Check the care team in [facility] and look for the attending/consulting  [facility] provider listed  Log into [email or web address] and use [place]'s universal password   Go to> "[facility]"  and find provider  If you still have difficulty reaching the provider, please page the [facility] (Director on Call) for the Hospitalists listed on amion

## 2023-04-20 NOTE — Plan of Care (Signed)

## 2023-04-21 DIAGNOSIS — R5082 Postprocedural fever: Secondary | ICD-10-CM | POA: Diagnosis not present

## 2023-04-21 LAB — BASIC METABOLIC PANEL
Anion gap: 8 (ref 5–15)
BUN: 14 mg/dL (ref 8–23)
CO2: 20 mmol/L — ABNORMAL LOW (ref 22–32)
Calcium: 8.3 mg/dL — ABNORMAL LOW (ref 8.9–10.3)
Chloride: 108 mmol/L (ref 98–111)
Creatinine, Ser: 0.73 mg/dL (ref 0.44–1.00)
GFR, Estimated: >60 mL/min (ref >60)
Glucose, Bld: 81 mg/dL (ref 70–99)
Potassium: 4.9 mmol/L (ref 3.5–5.1)
Sodium: 136 mmol/L (ref 135–145)

## 2023-04-21 LAB — CBC
HCT: 27.6 % — ABNORMAL LOW (ref 36.0–46.0)
Hemoglobin: 8.4 g/dL — ABNORMAL LOW (ref 12.0–15.0)
MCH: 28.9 pg (ref 26.0–34.0)
MCHC: 30.4 g/dL (ref 30.0–36.0)
MCV: 94.8 fL (ref 80.0–100.0)
Platelets: 287 10*3/uL (ref 150–400)
RBC: 2.91 MIL/uL — ABNORMAL LOW (ref 3.87–5.11)
RDW: 16.2 % — ABNORMAL HIGH (ref 11.5–15.5)
WBC: 6.2 10*3/uL (ref 4.0–10.5)
nRBC: 0 % (ref 0.0–0.2)

## 2023-04-21 LAB — MAGNESIUM: Magnesium: 2.1 mg/dL (ref 1.7–2.4)

## 2023-04-21 MED ORDER — PANTOPRAZOLE SODIUM 40 MG PO TBEC
40.0000 mg | DELAYED_RELEASE_TABLET | Freq: Every day | ORAL | Status: DC
  Administered 2023-04-21 – 2023-04-22 (×2): 40 mg via ORAL
  Filled 2023-04-21 (×2): qty 1

## 2023-04-21 MED ORDER — LOSARTAN POTASSIUM 50 MG PO TABS
100.0000 mg | ORAL_TABLET | Freq: Every day | ORAL | Status: DC
  Administered 2023-04-21 – 2023-04-23 (×3): 100 mg via ORAL
  Filled 2023-04-21 (×3): qty 2

## 2023-04-21 MED ORDER — CEFAZOLIN SODIUM-DEXTROSE 2-4 GM/100ML-% IV SOLN: 2.0000 g | Freq: Three times a day (TID) | INTRAVENOUS | Status: DC

## 2023-04-21 MED ORDER — OXYCODONE HCL 5 MG PO TABS
5.0000 mg | ORAL_TABLET | Freq: Four times a day (QID) | ORAL | Status: DC | PRN
  Administered 2023-04-21 – 2023-04-22 (×4): 5 mg via ORAL
  Filled 2023-04-21 (×5): qty 1

## 2023-04-21 MED ORDER — CEFAZOLIN SODIUM-DEXTROSE 2-4 GM/100ML-% IV SOLN
2.0000 g | Freq: Three times a day (TID) | INTRAVENOUS | Status: DC
  Administered 2023-04-21 – 2023-04-23 (×6): 2 g via INTRAVENOUS
  Filled 2023-04-21 (×6): qty 100

## 2023-04-21 MED ORDER — HYDROMORPHONE HCL 1 MG/ML IJ SOLN
0.5000 mg | INTRAMUSCULAR | Status: DC | PRN
  Administered 2023-04-22: 0.5 mg via INTRAVENOUS
  Filled 2023-04-21: qty 0.5

## 2023-04-21 MED ORDER — LOSARTAN POTASSIUM 50 MG PO TABS: 100.0000 mg | ORAL_TABLET | Freq: Every day | ORAL | Status: DC

## 2023-04-21 NOTE — Progress Notes (Addendum)
[facility] Progress Note  Patient: Jacqueline Mora     WUJ:[id number]  DOA: 04/17/2023   PCP: Jarrett Soho, PA-C       Brief hospital course: This is a 67 year old female with morbid obesity, hypertension, recurrent ventral incisional incarcerated who underwent complicated laparoscopic repair of abdominal hernia with mesh and panniculectomy on 04/04/2023.  Patient was admitted to the hospital with temperature of 103 degrees.  Interventional radiology placed bilateral subcutaneous abdominal drains on 04/18/2023.  900 cc of fluid drained.  Wound culture grew MSSA.  04/21/2023: No fever documented.  Temperature of 98.  Blood pressure is stable.  Pertinent results from lab work done today revealed hemoglobin of 8.4 g/dl, hematocrit of 95.6.  Doppler ultrasound of lower extremities is negative for DVT.  Wound cultures has grown MSSA.  Patient is currently on IV cefepime and vancomycin.  Subjective:  No fever or chills endorsed.    Assessment and Plan: Principal Problem: Fever, postop: -Wound culture grew MSSA. -No fever over the last 48 hours. -Patient is currently on IV vancomycin and cefepime.  Will change antibiotic to IV cefazolin. -Incentive spirometry. -Leukocytosis has resolved.    -WBC today 6.2 (down from 17.2).   -Lower extremity venous duplex negative for DVT  Active Problems: Hypokalemia: -Treated and resolved -Potassium is 4.9 today.      Code Status: Full Code Total time on patient care: 35 min  Objective:   Vitals:   04/20/23 0539 04/20/23 1351 04/20/23 2121 04/21/23 0437  BP: 129/64 (!) 155/82 (!) 159/86 (!) 163/98  Pulse: 71 64 77 69  Resp: 17 18 20 19   Temp: 98.1 F (36.7 C) 97.8 F (36.6 C) 98.5 F (36.9 C) 98 F (36.7 C)  TempSrc: Oral Oral    SpO2: 97% 100% 100% 98%  Weight:      Height:       Filed Weights   04/17/23 1925  Weight: 127 kg   Exam: General exam: Appears comfortable.  Patient is morbidly obese. HEENT: Mild pallor.  No  jaundice.   Respiratory system: Clear to auscultation.  Cardiovascular system: S1 & S2 heard  Gastrointestinal system: Abdomen is obese, soft, bilateral abdominal drains noted - incision across lower abdomen appears to be healing Extremities: No edema   CBC: Recent Labs  Lab 04/17/23 1942 04/18/23 0343 04/19/23 0331 04/20/23 0325 04/21/23 0250  WBC 13.6* 17.2* 9.7 6.7 6.2  NEUTROABS 12.0*  --   --   --   --   HGB 11.2* 10.0* 9.0* 8.2* 8.4*  HCT 35.0* 32.7* 29.0* 26.7* 27.6*  MCV 90.9 93.2 94.5 94.0 94.8  PLT 330 305 226 233 287   Basic Metabolic Panel: Recent Labs  Lab 04/17/23 1942 04/18/23 0342 04/18/23 0343 04/19/23 0331 04/20/23 0325 04/21/23 0250  NA 132*  --  135 136 136 136  K 2.8*  --  3.1* 3.7 3.9 4.9  CL 92*  --  96* 102 105 108  CO2 26  --  25 25 21* 20*  GLUCOSE 119*  --  120* 118* 89 81  BUN 11  --  11 24* 16 14  CREATININE 0.98  --  1.24* 1.58* 0.83 0.73  CALCIUM 8.3*  --  7.9* 7.6* 8.0* 8.3*  MG  --  1.6*  --   --   --  2.1     Scheduled Meds:  acetaminophen  1,000 mg Oral Q6H   enoxaparin (LOVENOX) injection  40 mg Subcutaneous Q24H   ezetimibe  10 mg  Oral Daily   FLUoxetine  20 mg Oral Daily   furosemide  20 mg Oral Daily   levothyroxine  150 mcg Oral Q0600   losartan  100 mg Oral Daily   pantoprazole  40 mg Oral Daily   polyethylene glycol  17 g Oral BID   psyllium  1 packet Oral BID   sodium chloride flush  5 mL Intracatheter Q8H   spironolactone  25 mg Oral Daily   Continuous Infusions:  sodium chloride 10 mL/hr at 04/20/23 2113   ceFEPime (MAXIPIME) IV 2 g (04/21/23 1032)   vancomycin 1,250 mg (04/20/23 2124)   Imaging and lab data was personally reviewed   Author: Barnetta Chapel  04/21/2023 10:56 AM  To contact [facility]>   Check the care team in [facility] and look for the attending/consulting [facility] provider listed  Log into [email or web address] and use [place]'s universal password   Go to> "[facility]"  and find  provider  If you still have difficulty reaching the provider, please page the [facility] (Director on Call) for the Hospitalists listed on amion

## 2023-04-21 NOTE — Progress Notes (Signed)
Subjective/Chief Complaint: Did well throughout the day yesterday but did have recurrence of that tightness and pain in the late afternoon.  Still endorsing some early satiety but continues to have bowel movements.  She reports that she moved around quite a bit yesterday.  Worried about her blood pressure.   Objective: Vital signs in last 24 hours: Temp:  [97.8 F (36.6 C)-98.5 F (36.9 C)] 98 F (36.7 C) (09/01 0437) Pulse Rate:  [64-77] 69 (09/01 0437) Resp:  [18-20] 19 (09/01 0437) BP: (155-163)/(82-98) 163/98 (09/01 0437) SpO2:  [98 %-100 %] 98 % (09/01 0437) Last BM Date : 04/20/23  Intake/Output from previous day: 08/31 0701 - 09/01 0700 In: 2341.7 [P.O.:1530; I.V.:329.4; IV Piggyback:447.3] Out: 1372 [Urine:900; Drains:472] Intake/Output this shift: No intake/output data recorded.  Alert, well-appearing Unlabored respirations Abdomen is distended, slightly firm unchanged from yesterday, appropriately tender.  Incisions clean, dry and intact.  Drain output is murky serosanguineous-volume total recorded 472.  Lab Results:  Recent Labs    04/20/23 0325 04/21/23 0250  WBC 6.7 6.2  HGB 8.2* 8.4*  HCT 26.7* 27.6*  PLT 233 287   BMET Recent Labs    04/20/23 0325 04/21/23 0250  NA 136 136  K 3.9 4.9  CL 105 108  CO2 21* 20*  GLUCOSE 89 81  BUN 16 14  CREATININE 0.83 0.73  CALCIUM 8.0* 8.3*   PT/INR No results for input(s): "LABPROT", "INR" in the last 72 hours.  ABG No results for input(s): "PHART", "HCO3" in the last 72 hours.  Invalid input(s): "PCO2", "PO2"  Studies/Results: VAS [place] LOWER EXTREMITY VENOUS (DVT)  Result Date: 04/19/2023  Lower Venous DVT Study Patient Name:  Jacqueline Mora  Date of Exam:   04/19/2023 Medical Rec #: [id number]        Accession #:    [id number] Date of Birth: June 17, 1956       Patient Gender: F Patient Age:   67 years Exam Location:  [facility] Procedure:      VAS [place] LOWER EXTREMITY VENOUS (DVT) Referring  Phys: [facility] --------------------------------------------------------------------------------  Indications: Edema.  Risk Factors: None identified Surgery. Limitations: Poor ultrasound/tissue interface. Comparison Study: No prior studies. Performing Technologist: Chanda Busing RVT  Examination Guidelines: A complete evaluation includes B-mode imaging, spectral Doppler, color Doppler, and power Doppler as needed of all accessible portions of each vessel. Bilateral testing is considered an integral part of a complete examination. Limited examinations for reoccurring indications may be performed as noted. The reflux portion of the exam is performed with the patient in reverse Trendelenburg.  +---------+---------------+---------+-----------+----------+--------------+ RIGHT    CompressibilityPhasicitySpontaneityPropertiesThrombus Aging +---------+---------------+---------+-----------+----------+--------------+ CFV      Full           Yes      Yes                                 +---------+---------------+---------+-----------+----------+--------------+ SFJ      Full                                                        +---------+---------------+---------+-----------+----------+--------------+ FV Prox  Full                                                        +---------+---------------+---------+-----------+----------+--------------+  FV Mid   Full                                                        +---------+---------------+---------+-----------+----------+--------------+ FV DistalFull                                                        +---------+---------------+---------+-----------+----------+--------------+ PFV      Full                                                        +---------+---------------+---------+-----------+----------+--------------+ POP      Full           Yes      Yes                                  +---------+---------------+---------+-----------+----------+--------------+ PTV      Full                                                        +---------+---------------+---------+-----------+----------+--------------+ PERO     Full                                                        +---------+---------------+---------+-----------+----------+--------------+   +---------+---------------+---------+-----------+----------+--------------+ LEFT     CompressibilityPhasicitySpontaneityPropertiesThrombus Aging +---------+---------------+---------+-----------+----------+--------------+ CFV      Full           Yes      Yes                                 +---------+---------------+---------+-----------+----------+--------------+ SFJ      Full                                                        +---------+---------------+---------+-----------+----------+--------------+ FV Prox  Full                                                        +---------+---------------+---------+-----------+----------+--------------+ FV Mid   Full                                                        +---------+---------------+---------+-----------+----------+--------------+  FV DistalFull                                                        +---------+---------------+---------+-----------+----------+--------------+ PFV      Full                                                        +---------+---------------+---------+-----------+----------+--------------+ POP      Full           Yes      Yes                                 +---------+---------------+---------+-----------+----------+--------------+ PTV      Full                                                        +---------+---------------+---------+-----------+----------+--------------+ PERO     Full                                                         +---------+---------------+---------+-----------+----------+--------------+     Summary: RIGHT: - There is no evidence of deep vein thrombosis in the lower extremity.  - No cystic structure found in the popliteal fossa.  LEFT: - There is no evidence of deep vein thrombosis in the lower extremity.  - No cystic structure found in the popliteal fossa.  *See table(s) above for measurements and observations. Electronically signed by Lemar Livings MD on 04/19/2023 at 7:09:46 PM.    Final     Anti-infectives: Anti-infectives (From admission, onward)    Start     Dose/Rate Route Frequency Ordered Stop   04/21/23 0200  ceFEPIme (MAXIPIME) 2 g in sodium chloride 0.9 % 100 mL IVPB        2 g 200 mL/hr over 30 Minutes Intravenous Every 8 hours 04/20/23 1911     04/20/23 2200  vancomycin (VANCOREADY) IVPB 1250 mg/250 mL        1,250 mg 166.7 mL/hr over 90 Minutes Intravenous Every 24 hours 04/20/23 1825 04/28/29 2159   04/19/23 2200  vancomycin (VANCOCIN) IVPB 1000 mg/200 mL premix  Status:  Discontinued        1,000 mg 200 mL/hr over 60 Minutes Intravenous Every 24 hours 04/19/23 0808 04/20/23 1824   04/19/23 1800  ceFEPIme (MAXIPIME) 2 g in sodium chloride 0.9 % 100 mL IVPB  Status:  Discontinued        2 g 200 mL/hr over 30 Minutes Intravenous Every 12 hours 04/19/23 0808 04/20/23 1911   04/18/23 2200  vancomycin (VANCOREADY) IVPB 1250 mg/250 mL  Status:  Discontinued        1,250 mg 166.7 mL/hr over 90 Minutes Intravenous Every 24 hours 04/18/23 1233 04/19/23 0808   04/18/23 1500  ceFAZolin (ANCEF)  IVPB 2g/100 mL premix  Status:  Discontinued        2 g 200 mL/hr over 30 Minutes Intravenous Every 8 hours 04/18/23 1127 04/18/23 1314   04/18/23 1415  ceFEPIme (MAXIPIME) 2 g in sodium chloride 0.9 % 100 mL IVPB  Status:  Discontinued        2 g 200 mL/hr over 30 Minutes Intravenous Every 8 hours 04/18/23 1318 04/19/23 0808   04/18/23 1400  ceFEPIme (MAXIPIME) 2 g in sodium chloride 0.9 % 100 mL IVPB   Status:  Discontinued        2 g 200 mL/hr over 30 Minutes Intravenous Every 8 hours 04/18/23 1027 04/18/23 1127   04/18/23 1045  piperacillin-tazobactam (ZOSYN) IVPB 3.375 g  Status:  Discontinued        3.375 g 12.5 mL/hr over 240 Minutes Intravenous Every 8 hours 04/18/23 0958 04/18/23 0959   04/18/23 1045  ceFEPIme (MAXIPIME) 2 g in sodium chloride 0.9 % 100 mL IVPB  Status:  Discontinued        2 g 200 mL/hr over 30 Minutes Intravenous Every 8 hours 04/18/23 0959 04/18/23 1027   04/18/23 1045  metroNIDAZOLE (FLAGYL) IVPB 500 mg  Status:  Discontinued        500 mg 100 mL/hr over 60 Minutes Intravenous Every 12 hours 04/18/23 0959 04/18/23 1127   04/18/23 0600  ceFEPIme (MAXIPIME) 2 g in sodium chloride 0.9 % 100 mL IVPB        2 g 200 mL/hr over 30 Minutes Intravenous  Once 04/18/23 0513 04/18/23 0729   04/17/23 2045  vancomycin (VANCOREADY) IVPB 2000 mg/400 mL        2,000 mg 200 mL/hr over 120 Minutes Intravenous  Once 04/17/23 2030 04/17/23 2317   04/17/23 2045  ceFEPIme (MAXIPIME) 2 g in sodium chloride 0.9 % 100 mL IVPB        2 g 200 mL/hr over 30 Minutes Intravenous  Once 04/17/23 2036 04/17/23 2143   04/17/23 2045  metroNIDAZOLE (FLAGYL) IVPB 500 mg        500 mg 100 mL/hr over 60 Minutes Intravenous  Once 04/17/23 2036 04/17/23 2250       Assessment/Plan: 67 year old woman who is status post laparoscopic recurrent ventral hernia repair with panniculectomy on 8/15 by Dr. Michaell Cowing Now status post seroma drainage, still having a good bit of pain but does seem to be improving.  Did not like the Robaxin so we will discontinue that.  Continue mobilizing as tolerated.   LOS: 3 days    Berna Bue 04/21/2023

## 2023-04-22 DIAGNOSIS — R5082 Postprocedural fever: Secondary | ICD-10-CM | POA: Diagnosis not present

## 2023-04-22 LAB — CULTURE, BLOOD (ROUTINE X 2)
Culture: NO GROWTH
Culture: NO GROWTH
Special Requests: ADEQUATE
Special Requests: ADEQUATE

## 2023-04-22 MED ORDER — FUROSEMIDE 10 MG/ML IJ SOLN
40.0000 mg | Freq: Once | INTRAMUSCULAR | Status: AC
  Administered 2023-04-22: 40 mg via INTRAVENOUS
  Filled 2023-04-22: qty 4

## 2023-04-22 NOTE — Progress Notes (Signed)
Subjective/Chief Complaint: No major changes.  She is comfortable emptying and recharging the drains, but cannot reach or manipulate the attachments to flush the drains and had a bit of a anxiety attack about not being able to do this when she goes home.   Objective: Vital signs in last 24 hours: Temp:  [97.9 F (36.6 C)-98.1 F (36.7 C)] 97.9 F (36.6 C) (09/02 0534) Pulse Rate:  [60-80] 60 (09/02 0534) Resp:  [17] 17 (09/02 0534) BP: (143-161)/(65-75) 143/75 (09/02 0534) SpO2:  [93 %-99 %] 97 % (09/02 0534) Last BM Date : 04/21/23  Intake/Output from previous day: 09/01 0701 - 09/02 0700 In: 1030.1 [P.O.:660; I.V.:40.2; IV Piggyback:299.9] Out: 866 [Urine:500; Drains:366] Intake/Output this shift: No intake/output data recorded.  Alert, well-appearing Unlabored respirations Abdomen is obese, softer than previous days, appropriately tender.  Incisions clean, dry and intact.  Drain output is serosanguineous-volume total recorded 366.  Lab Results:  Recent Labs    04/20/23 0325 04/21/23 0250  WBC 6.7 6.2  HGB 8.2* 8.4*  HCT 26.7* 27.6*  PLT 233 287   BMET Recent Labs    04/20/23 0325 04/21/23 0250  NA 136 136  K 3.9 4.9  CL 105 108  CO2 21* 20*  GLUCOSE 89 81  BUN 16 14  CREATININE 0.83 0.73  CALCIUM 8.0* 8.3*   PT/INR No results for input(s): "LABPROT", "INR" in the last 72 hours.  ABG No results for input(s): "PHART", "HCO3" in the last 72 hours.  Invalid input(s): "PCO2", "PO2"  Studies/Results: No results found.  Anti-infectives: Anti-infectives (From admission, onward)    Start     Dose/Rate Route Frequency Ordered Stop   04/21/23 1800  ceFAZolin (ANCEF) IVPB 2g/100 mL premix  Status:  Discontinued        2 g 200 mL/hr over 30 Minutes Intravenous Every 8 hours 04/21/23 1645 04/21/23 1645   04/21/23 1800  ceFAZolin (ANCEF) IVPB 2g/100 mL premix        2 g 200 mL/hr over 30 Minutes Intravenous Every 8 hours 04/21/23 1646     04/21/23  0200  ceFEPIme (MAXIPIME) 2 g in sodium chloride 0.9 % 100 mL IVPB  Status:  Discontinued        2 g 200 mL/hr over 30 Minutes Intravenous Every 8 hours 04/20/23 1911 04/21/23 1643   04/20/23 2200  vancomycin (VANCOREADY) IVPB 1250 mg/250 mL  Status:  Discontinued        1,250 mg 166.7 mL/hr over 90 Minutes Intravenous Every 24 hours 04/20/23 1825 04/21/23 1643   04/19/23 2200  vancomycin (VANCOCIN) IVPB 1000 mg/200 mL premix  Status:  Discontinued        1,000 mg 200 mL/hr over 60 Minutes Intravenous Every 24 hours 04/19/23 0808 04/20/23 1824   04/19/23 1800  ceFEPIme (MAXIPIME) 2 g in sodium chloride 0.9 % 100 mL IVPB  Status:  Discontinued        2 g 200 mL/hr over 30 Minutes Intravenous Every 12 hours 04/19/23 0808 04/20/23 1911   04/18/23 2200  vancomycin (VANCOREADY) IVPB 1250 mg/250 mL  Status:  Discontinued        1,250 mg 166.7 mL/hr over 90 Minutes Intravenous Every 24 hours 04/18/23 1233 04/19/23 0808   04/18/23 1500  ceFAZolin (ANCEF) IVPB 2g/100 mL premix  Status:  Discontinued        2 g 200 mL/hr over 30 Minutes Intravenous Every 8 hours 04/18/23 1127 04/18/23 1314   04/18/23 1415  ceFEPIme (MAXIPIME) 2 g  in sodium chloride 0.9 % 100 mL IVPB  Status:  Discontinued        2 g 200 mL/hr over 30 Minutes Intravenous Every 8 hours 04/18/23 1318 04/19/23 0808   04/18/23 1400  ceFEPIme (MAXIPIME) 2 g in sodium chloride 0.9 % 100 mL IVPB  Status:  Discontinued        2 g 200 mL/hr over 30 Minutes Intravenous Every 8 hours 04/18/23 1027 04/18/23 1127   04/18/23 1045  piperacillin-tazobactam (ZOSYN) IVPB 3.375 g  Status:  Discontinued        3.375 g 12.5 mL/hr over 240 Minutes Intravenous Every 8 hours 04/18/23 0958 04/18/23 0959   04/18/23 1045  ceFEPIme (MAXIPIME) 2 g in sodium chloride 0.9 % 100 mL IVPB  Status:  Discontinued        2 g 200 mL/hr over 30 Minutes Intravenous Every 8 hours 04/18/23 0959 04/18/23 1027   04/18/23 1045  metroNIDAZOLE (FLAGYL) IVPB 500 mg  Status:   Discontinued        500 mg 100 mL/hr over 60 Minutes Intravenous Every 12 hours 04/18/23 0959 04/18/23 1127   04/18/23 0600  ceFEPIme (MAXIPIME) 2 g in sodium chloride 0.9 % 100 mL IVPB        2 g 200 mL/hr over 30 Minutes Intravenous  Once 04/18/23 0513 04/18/23 0729   04/17/23 2045  vancomycin (VANCOREADY) IVPB 2000 mg/400 mL        2,000 mg 200 mL/hr over 120 Minutes Intravenous  Once 04/17/23 2030 04/17/23 2317   04/17/23 2045  ceFEPIme (MAXIPIME) 2 g in sodium chloride 0.9 % 100 mL IVPB        2 g 200 mL/hr over 30 Minutes Intravenous  Once 04/17/23 2036 04/17/23 2143   04/17/23 2045  metroNIDAZOLE (FLAGYL) IVPB 500 mg        500 mg 100 mL/hr over 60 Minutes Intravenous  Once 04/17/23 2036 04/17/23 2250       Assessment/Plan: 67 year old woman who is status post laparoscopic recurrent ventral hernia repair with panniculectomy on 8/15 by Dr. Michaell Cowing Now status post seroma drainage, still having a good bit of pain but does seem to be improving. Continue mobilizing as tolerated.  She is amenable to discharge home tomorrow.  I think it would be reasonable for her to empty and recharge the drains on her own and record output, and then just have them flushed whenever the home health nurse comes to the house.  Will defer to Dr. Michaell Cowing about repeating a CT scan before she leaves, drain output has still been somewhat high so I do not think these are ready to be removed at this point.   LOS: 4 days    Berna Bue 04/22/2023

## 2023-04-22 NOTE — Progress Notes (Signed)
PROGRESS NOTE  HANADI Mora  ZOX:[id number] DOB: Jul 13, 1956 DOA: 04/17/2023 PCP: Jarrett Soho, PA-C   Brief Narrative: Patient is a 67 year old female with history of morbid obesity, hypertension, recurrent ventral incisional incarcerated hernia who initially presented with fever, nausea, vomiting.  She recently had repair of the hernia by Dr. Michaell Cowing on 8/15.  IR was consulted and she underwent bilateral subcutaneous abdomen drains on 04/18/2023.  Wound culture grew MSSA.  Currently she is afebrile, hemodynamically stable.  Currently on cefazolin.   Assessment & Plan:  Principal Problem:   Fever postop Active Problems:   Morbid obesity (HCC)   Essential hypertension   Hyperlipidemia   Recurrent incisional hernia   Prediabetes   IBS (irritable bowel syndrome)   GERD (gastroesophageal reflux disease)   Anxiety   Hypokalemia   Nausea & vomiting   Abscess of abdominal wall   CKD (chronic kidney disease) stage 2, GFR 60-89 ml/min  Hernia  wound infection with MSSA /fever: Presented with fever, nausea/vomiting.  She is  status post laparoscopic recurrent ventral hernia repair with panniculectomy on 8/15 by Dr. Elgie Collard.  IR was consulted,she underwent bilateral subcutaneous abdomen drains on 04/18/2023.  Wound culture grew MSSA.  Currently she is afebrile, hemodynamically stable.  Currently on cefazolin.  Blood cultures have not shown any growth.  No leukocytosis. Likely we can change antibiotics to cefadroxil on discharge which could be continued for 14 days.  Normocytic  anemia: No evidence of acute blood loss.  Currently hemoglobin stable in the range of 8.  Hypertension: Currently blood pressure stable.  Continue losartan  Hypothyroidism: Continue Synthyroid  Hyperlipidemia: On Zetia, Crestor  History of anxiety: On Xanax at home  Diastolic CHF:  Takes spironolactone, Lasix at home.  Has significant bilateral lower extremity edema.  Will give a dose of Lasix IV 40 mg once  today  Morbid obesity: BMI of  45       DVT prophylaxis:enoxaparin (LOVENOX) injection 40 mg Start: 04/20/23 0800 SCDs Start: 04/18/23 0027     Code Status: Full Code  Procedures: Abdominal drain placement  Antimicrobials:  Anti-infectives (From admission, onward)    Start     Dose/Rate Route Frequency Ordered Stop   04/21/23 1800  ceFAZolin (ANCEF) IVPB 2g/100 mL premix  Status:  Discontinued        2 g 200 mL/hr over 30 Minutes Intravenous Every 8 hours 04/21/23 1645 04/21/23 1645   04/21/23 1800  ceFAZolin (ANCEF) IVPB 2g/100 mL premix        2 g 200 mL/hr over 30 Minutes Intravenous Every 8 hours 04/21/23 1646     04/21/23 0200  ceFEPIme (MAXIPIME) 2 g in sodium chloride 0.9 % 100 mL IVPB  Status:  Discontinued        2 g 200 mL/hr over 30 Minutes Intravenous Every 8 hours 04/20/23 1911 04/21/23 1643   04/20/23 2200  vancomycin (VANCOREADY) IVPB 1250 mg/250 mL  Status:  Discontinued        1,250 mg 166.7 mL/hr over 90 Minutes Intravenous Every 24 hours 04/20/23 1825 04/21/23 1643   04/19/23 2200  vancomycin (VANCOCIN) IVPB 1000 mg/200 mL premix  Status:  Discontinued        1,000 mg 200 mL/hr over 60 Minutes Intravenous Every 24 hours 04/19/23 0808 04/20/23 1824   04/19/23 1800  ceFEPIme (MAXIPIME) 2 g in sodium chloride 0.9 % 100 mL IVPB  Status:  Discontinued        2 g 200 mL/hr over 30 Minutes Intravenous  Every 12 hours 04/19/23 0808 04/20/23 1911   04/18/23 2200  vancomycin (VANCOREADY) IVPB 1250 mg/250 mL  Status:  Discontinued        1,250 mg 166.7 mL/hr over 90 Minutes Intravenous Every 24 hours 04/18/23 1233 04/19/23 0808   04/18/23 1500  ceFAZolin (ANCEF) IVPB 2g/100 mL premix  Status:  Discontinued        2 g 200 mL/hr over 30 Minutes Intravenous Every 8 hours 04/18/23 1127 04/18/23 1314   04/18/23 1415  ceFEPIme (MAXIPIME) 2 g in sodium chloride 0.9 % 100 mL IVPB  Status:  Discontinued        2 g 200 mL/hr over 30 Minutes Intravenous Every 8 hours 04/18/23  1318 04/19/23 0808   04/18/23 1400  ceFEPIme (MAXIPIME) 2 g in sodium chloride 0.9 % 100 mL IVPB  Status:  Discontinued        2 g 200 mL/hr over 30 Minutes Intravenous Every 8 hours 04/18/23 1027 04/18/23 1127   04/18/23 1045  piperacillin-tazobactam (ZOSYN) IVPB 3.375 g  Status:  Discontinued        3.375 g 12.5 mL/hr over 240 Minutes Intravenous Every 8 hours 04/18/23 0958 04/18/23 0959   04/18/23 1045  ceFEPIme (MAXIPIME) 2 g in sodium chloride 0.9 % 100 mL IVPB  Status:  Discontinued        2 g 200 mL/hr over 30 Minutes Intravenous Every 8 hours 04/18/23 0959 04/18/23 1027   04/18/23 1045  metroNIDAZOLE (FLAGYL) IVPB 500 mg  Status:  Discontinued        500 mg 100 mL/hr over 60 Minutes Intravenous Every 12 hours 04/18/23 0959 04/18/23 1127   04/18/23 0600  ceFEPIme (MAXIPIME) 2 g in sodium chloride 0.9 % 100 mL IVPB        2 g 200 mL/hr over 30 Minutes Intravenous  Once 04/18/23 0513 04/18/23 0729   04/17/23 2045  vancomycin (VANCOREADY) IVPB 2000 mg/400 mL        2,000 mg 200 mL/hr over 120 Minutes Intravenous  Once 04/17/23 2030 04/17/23 2317   04/17/23 2045  ceFEPIme (MAXIPIME) 2 g in sodium chloride 0.9 % 100 mL IVPB        2 g 200 mL/hr over 30 Minutes Intravenous  Once 04/17/23 2036 04/17/23 2143   04/17/23 2045  metroNIDAZOLE (FLAGYL) IVPB 500 mg        500 mg 100 mL/hr over 60 Minutes Intravenous  Once 04/17/23 2036 04/17/23 2250       Subjective:  Patient seen and examined at bedside today.  Hemodynamically stable.  Comfortably lying in bed denies any abdomen pain, nausea or vomiting.  Having regular bowel movements.  There were 2 drains on either side of the abdomen.  She lives alone and feels ready to go home tomorrow.  Objective: Vitals:   04/21/23 1302 04/21/23 1324 04/21/23 2137 04/22/23 0534  BP:  (!) 149/65 (!) 161/73 (!) 143/75  Pulse:  80 71 60  Resp:  17 17 17   Temp: 98 F (36.7 C) 98 F (36.7 C) 98.1 F (36.7 C) 97.9 F (36.6 C)  TempSrc: Oral Oral  Oral Oral  SpO2:  93% 99% 97%  Weight:      Height:        Intake/Output Summary (Last 24 hours) at 04/22/2023 0908 Last data filed at 04/22/2023 0600 Gross per 24 hour  Intake 1030.09 ml  Output 866 ml  Net 164.09 ml   Filed Weights   04/17/23 1925  Weight: 127  kg    Examination:  General exam: Overall comfortable, not in distress,obese HEENT: PERRL Respiratory system:  no wheezes or crackles  Cardiovascular system: S1 & S2 heard, RRR.  Gastrointestinal system: Abdomen is nondistended, soft and nontender.  Linear horizontal surgical scar, 1 abdominal drain on each side  Central nervous system: Alert and oriented Extremities: 2-3+ lower extremity pitting edema, no clubbing ,no cyanosis Skin: No rashes, no ulcers,no icterus     Data Reviewed: I have personally reviewed following labs and imaging studies  CBC: Recent Labs  Lab 04/17/23 1942 04/18/23 0343 04/19/23 0331 04/20/23 0325 04/21/23 0250  WBC 13.6* 17.2* 9.7 6.7 6.2  NEUTROABS 12.0*  --   --   --   --   HGB 11.2* 10.0* 9.0* 8.2* 8.4*  HCT 35.0* 32.7* 29.0* 26.7* 27.6*  MCV 90.9 93.2 94.5 94.0 94.8  PLT 330 305 226 233 287   Basic Metabolic Panel: Recent Labs  Lab 04/17/23 1942 04/18/23 0342 04/18/23 0343 04/19/23 0331 04/20/23 0325 04/21/23 0250  NA 132*  --  135 136 136 136  K 2.8*  --  3.1* 3.7 3.9 4.9  CL 92*  --  96* 102 105 108  CO2 26  --  25 25 21* 20*  GLUCOSE 119*  --  120* 118* 89 81  BUN 11  --  11 24* 16 14  CREATININE 0.98  --  1.24* 1.58* 0.83 0.73  CALCIUM 8.3*  --  7.9* 7.6* 8.0* 8.3*  MG  --  1.6*  --   --   --  2.1     Recent Results (from the past 240 hour(s))  Blood Culture (routine x 2)     Status: None   Collection Time: 04/17/23  7:42 PM   Specimen: BLOOD  Result Value Ref Range Status   Specimen Description   Final    BLOOD LEFT ANTECUBITAL Performed at [facility], [address], [place], [place] [address]    Special Requests   Final     BOTTLES DRAWN AEROBIC AND ANAEROBIC Blood Culture adequate volume Performed at [facility], [address] [place], [place] [address]    Culture   Final    NO GROWTH 5 DAYS Performed at [facility], [address], [place], [place] [address]    Report Status 04/22/2023 FINAL  Final  Blood Culture (routine x 2)     Status: None   Collection Time: 04/17/23  8:06 PM   Specimen: BLOOD  Result Value Ref Range Status   Specimen Description   Final    BLOOD LEFT ANTECUBITAL Performed at [facility], [address], [place], [place] [address]    Special Requests   Final    BOTTLES DRAWN AEROBIC AND ANAEROBIC Blood Culture adequate volume Performed at [facility], [address] [place], [place] [address]    Culture   Final    NO GROWTH 5 DAYS Performed at [facility], [address], [place], [place] [address]    Report Status 04/22/2023 FINAL  Final  SARS Coronavirus 2 by RT PCR (hospital order, performed in [facility] hospital lab) *cepheid single result test* Anterior Nasal Swab     Status: None   Collection Time: 04/17/23  8:25 PM   Specimen: Anterior Nasal Swab  Result Value Ref Range Status   SARS Coronavirus 2 by RT PCR NEGATIVE NEGATIVE Final    Comment: (NOTE) SARS-CoV-2 target nucleic  acids are NOT DETECTED.  The SARS-CoV-2 RNA is generally detectable in upper and lower respiratory specimens during the acute phase of infection. The lowest concentration of SARS-CoV-2 viral copies this assay can detect is 250 copies / mL. A negative result does not preclude SARS-CoV-2 infection and should not be used as the sole basis for treatment or other patient management decisions.  A negative result may occur with improper specimen collection / handling, submission of specimen other than nasopharyngeal swab, presence of viral mutation(s) within the areas targeted by this assay, and inadequate number of viral  copies (<250 copies / mL). A negative result must be combined with clinical observations, patient history, and epidemiological information.  Fact Sheet for Patients:   RoadLapTop.co.za  Fact Sheet for Healthcare Providers: http://kim-miller.com/  This test is not yet approved or  cleared by the [place] FDA and has been authorized for detection and/or diagnosis of SARS-CoV-2 by FDA under an Emergency Use Authorization (EUA).  This EUA will remain in effect (meaning this test can be used) for the duration of the COVID-19 declaration under Section 564(b)(1) of the Act, 21 U.S.C. section 360bbb-3(b)(1), unless the authorization is terminated or revoked sooner.  Performed at [facility], [address], [place], [place] [address]   Respiratory (~20 pathogens) panel by PCR     Status: None   Collection Time: 04/18/23 12:06 PM   Specimen: Nasopharyngeal Swab; Respiratory  Result Value Ref Range Status   Adenovirus NOT DETECTED NOT DETECTED Final   Coronavirus 229E NOT DETECTED NOT DETECTED Final    Comment: (NOTE) The Coronavirus on the Respiratory Panel, DOES NOT test for the novel  Coronavirus (2019 nCoV)    Coronavirus HKU1 NOT DETECTED NOT DETECTED Final   Coronavirus NL63 NOT DETECTED NOT DETECTED Final   Coronavirus OC43 NOT DETECTED NOT DETECTED Final   Metapneumovirus NOT DETECTED NOT DETECTED Final   Rhinovirus / Enterovirus NOT DETECTED NOT DETECTED Final   Influenza A NOT DETECTED NOT DETECTED Final   Influenza B NOT DETECTED NOT DETECTED Final   Parainfluenza Virus 1 NOT DETECTED NOT DETECTED Final   Parainfluenza Virus 2 NOT DETECTED NOT DETECTED Final   Parainfluenza Virus 3 NOT DETECTED NOT DETECTED Final   Parainfluenza Virus 4 NOT DETECTED NOT DETECTED Final   Respiratory Syncytial Virus NOT DETECTED NOT DETECTED Final   Bordetella pertussis NOT DETECTED NOT DETECTED Final   Bordetella  Parapertussis NOT DETECTED NOT DETECTED Final   Chlamydophila pneumoniae NOT DETECTED NOT DETECTED Final   Mycoplasma pneumoniae NOT DETECTED NOT DETECTED Final    Comment: Performed at [facility], [address], [place], [place] [address]  Aerobic/Anaerobic Culture w Gram Stain (surgical/deep wound)     Status: None (Preliminary result)   Collection Time: 04/18/23  3:14 PM   Specimen: Wound; Serous Fluid  Result Value Ref Range Status   Specimen Description   Final    WOUND ABDOMEN Performed at [facility], [address], [place], [place] [address]    Special Requests   Final    NONE Performed at [facility], [address] [place], [place] [address]    Gram Stain   Final    MODERATE WBC PRESENT, PREDOMINANTLY PMN NO ORGANISMS SEEN Performed at [facility], [address], [place], [place] [address]    Culture   Final    FEW STAPHYLOCOCCUS AUREUS NO ANAEROBES ISOLATED; CULTURE IN PROGRESS FOR 5 DAYS  Report Status PENDING  Incomplete   Organism ID, Bacteria STAPHYLOCOCCUS AUREUS  Final      Susceptibility   Staphylococcus aureus - MIC*    CIPROFLOXACIN <=0.5 SENSITIVE Sensitive     ERYTHROMYCIN <=0.25 SENSITIVE Sensitive     GENTAMICIN <=0.5 SENSITIVE Sensitive     OXACILLIN 0.5 SENSITIVE Sensitive     TETRACYCLINE >=16 RESISTANT Resistant     VANCOMYCIN 1 SENSITIVE Sensitive     TRIMETH/SULFA <=10 SENSITIVE Sensitive     CLINDAMYCIN <=0.25 SENSITIVE Sensitive     RIFAMPIN <=0.5 SENSITIVE Sensitive     Inducible Clindamycin NEGATIVE Sensitive     LINEZOLID 2 SENSITIVE Sensitive     * FEW STAPHYLOCOCCUS AUREUS     Radiology Studies: No results found.  Scheduled Meds:  acetaminophen  1,000 mg Oral Q6H   enoxaparin (LOVENOX) injection  40 mg Subcutaneous Q24H   ezetimibe  10 mg Oral Daily   FLUoxetine  20 mg Oral Daily   furosemide  20 mg Oral Daily   levothyroxine  150 mcg Oral Q0600   losartan  100 mg Oral Daily    pantoprazole  40 mg Oral Daily   polyethylene glycol  17 g Oral BID   psyllium  1 packet Oral BID   sodium chloride flush  5 mL Intracatheter Q8H   spironolactone  25 mg Oral Daily   Continuous Infusions:  sodium chloride 10 mL/hr at 04/20/23 2113    ceFAZolin (ANCEF) IV 2 g (04/22/23 0130)     LOS: 4 days   Burnadette Pop, MD [facility] P9/09/2022, 9:08 AM

## 2023-04-23 DIAGNOSIS — R5082 Postprocedural fever: Secondary | ICD-10-CM | POA: Diagnosis not present

## 2023-04-23 LAB — AEROBIC/ANAEROBIC CULTURE W GRAM STAIN (SURGICAL/DEEP WOUND)

## 2023-04-23 LAB — BASIC METABOLIC PANEL
Anion gap: 10 (ref 5–15)
BUN: 10 mg/dL (ref 8–23)
CO2: 24 mmol/L (ref 22–32)
Calcium: 8.4 mg/dL — ABNORMAL LOW (ref 8.9–10.3)
Chloride: 101 mmol/L (ref 98–111)
Creatinine, Ser: 0.75 mg/dL (ref 0.44–1.00)
GFR, Estimated: >60 mL/min (ref >60)
Glucose, Bld: 94 mg/dL (ref 70–99)
Potassium: 3.4 mmol/L — ABNORMAL LOW (ref 3.5–5.1)
Sodium: 135 mmol/L (ref 135–145)

## 2023-04-23 MED ORDER — ONDANSETRON 4 MG PO TBDP: 4.0000 mg | ORAL_TABLET | Freq: Four times a day (QID) | ORAL | 2 refills | Status: AC | PRN

## 2023-04-23 MED ORDER — POTASSIUM CHLORIDE CRYS ER 20 MEQ PO TBCR
40.0000 meq | EXTENDED_RELEASE_TABLET | Freq: Two times a day (BID) | ORAL | Status: DC
  Administered 2023-04-23: 40 meq via ORAL
  Filled 2023-04-23: qty 2

## 2023-04-23 MED ORDER — ONDANSETRON HCL 4 MG PO TABS
4.0000 mg | ORAL_TABLET | Freq: Three times a day (TID) | ORAL | Status: DC
Start: 2023-04-23 — End: 2023-04-23

## 2023-04-23 MED ORDER — FUROSEMIDE 10 MG/ML IJ SOLN
40.0000 mg | Freq: Once | INTRAMUSCULAR | Status: AC
  Administered 2023-04-23: 40 mg via INTRAVENOUS
  Filled 2023-04-23: qty 4

## 2023-04-23 MED ORDER — FUROSEMIDE 20 MG PO TABS: 20.0000 mg | ORAL_TABLET | Freq: Every day | ORAL | Status: DC

## 2023-04-23 MED ORDER — POTASSIUM CHLORIDE CRYS ER 20 MEQ PO TBCR: 40.0000 meq | EXTENDED_RELEASE_TABLET | Freq: Every day | ORAL | 0 refills | Status: DC

## 2023-04-23 MED ORDER — ONDANSETRON HCL 4 MG PO TABS
4.0000 mg | ORAL_TABLET | Freq: Three times a day (TID) | ORAL | Status: DC
  Administered 2023-04-23: 4 mg via ORAL
  Filled 2023-04-23: qty 1

## 2023-04-23 MED ORDER — POTASSIUM CHLORIDE CRYS ER 20 MEQ PO TBCR: 40.0000 meq | EXTENDED_RELEASE_TABLET | Freq: Once | ORAL | Status: DC

## 2023-04-23 MED ORDER — PANTOPRAZOLE SODIUM 40 MG PO TBEC
40.0000 mg | DELAYED_RELEASE_TABLET | Freq: Two times a day (BID) | ORAL | Status: DC
  Administered 2023-04-23: 40 mg via ORAL
  Filled 2023-04-23: qty 1

## 2023-04-23 MED ORDER — CEFADROXIL 500 MG/5ML PO SUSR: 1000.0000 mg | Freq: Two times a day (BID) | ORAL | 0 refills | Status: AC

## 2023-04-23 NOTE — Progress Notes (Signed)
Patient was given discharge instructions, and all questions were answered. Patient was stable for discharge and was taken to the main exit by wheelchair. Education was completed on JP drains.

## 2023-04-23 NOTE — Progress Notes (Signed)
04/23/2023  Jacqueline Mora [id number] 30-Apr-1956  CARE TEAM: PCP: Jarrett Soho, PA-C  Outpatient Care Team: Patient Care Team: Jarrett Soho, PA-C as PCP - General (Family Medicine) Christell Constant, MD as PCP - Cardiology (Cardiology) Karie Soda, MD as Consulting Physician (General Surgery)  Inpatient Treatment Team: Treatment Team:  Montez Morita, Md, MD Montez Morita, Md, MD Karie Soda, MD We[facility] Croon [facility]iology, MD Bess Kinds, RN Alanson Puls, MD Burnadette Pop, MD Earney Mallet, RN Fenton Malling, NT Kusnitz, Eli Phillips, RN [place], Deirdre Evener, [facility] Delfina Redwood, RN   Problem List:   Principal Problem:   Fever postop Active Problems:   Morbid obesity (HCC)   Essential hypertension   Hyperlipidemia   Recurrent incisional hernia   Prediabetes   IBS (irritable bowel syndrome)   GERD (gastroesophageal reflux disease)   Anxiety   Hypokalemia   Nausea & vomiting   Abscess of abdominal wall   CKD (chronic kidney disease) stage 2, GFR 60-89 ml/min   04/04/2023  POST-OPERATIVE DIAGNOSIS:   VENTRAL INCISIONAL REOCRRUING AND INCARCERATED HERNIA  Dimensions of hernia post-op:  18cm x 11cm PANNICULUS WITH HISTORY OF MESH INFECTION & GIANT HERNIA SAC   PROCEDURE:   LAPAROSCOPIC REPAIR OF ABDOMINAL HERNIA WITH MESH  (See OR Findings below) PANNICULECTOMY TAP BLOCK - BILATERAL   SURGEON:  Ardeth Sportsman, MD  OR FINDINGS:  Swiss cheese incisional hernia primarily super and periumbilical incarcerated with most of the greater omentum as well as mid transverse colon.  18 x 11 cm region total.  Old spiral metal tacks and sutures noted from prior mesh repair.  Those were excised.  No remaining mesh.   Hernia location: Periumbilical   Hernia type: Recurrent incisional - Incarcerated   Hernia size - largest dimension 18cm x 11cm   Type of repair: Laparoscopic underlay repair.  Primary repair of largest hernia              Placement of mesh:  Centrally intraperitoneal with edges tucked into RECTRORECTUS & preperitoneal space   Name of mesh: Bard Ventralight dual sided (polypropylene / Seprafilm)   Size of mesh: 33x27cm   Orientation: Transverse   Mesh overlap:  5-7cm  Assessment [facility] Stay = 5 days)      Gradually improving   Plan:  Nausea and spitting up this morning.  She is not obstructed.  When I noted it was fine for discharge, she sat up in bed and started retching on the sink feeling nauseated.  I discussed with her RN outside the room who is getting her some nausea meds.   Continue PPI.  See if she feels better.  No emesis episodes since she has been in the hospital.  If she can tolerate lunch, hopefully she can go home.    Solid diet  Bowel regimen.  MiraLAX twice daily for chronic constipation.  Having bowel movements at least every other day  Elevated creatinine resolved.  Most likely site of some dehydration perhaps NSAID use.  I wonder if she has some baseline chronic kidney disease since she bumped her creatinine rather easily.  I do not think she is oliguric.  Follow and treat  Hypokalemia recurrent with furosemide.  Continue correction as needed.  Continue some supplementation  Some dyspnea and questionable hypoxia is resolved.  Defer to medicine on further interventions.  No evidence of a upper respiratory infection at this time.  Seromas grew out MSSA after third day.  Mostly pansensitive.  She has defervesced  and no leukocytosis on cefazolin.  Unfortunately with her prior history of mesh infection many years ago in the setting of morbid obesity, she was at risk.  Will switch to cephalexin at d/c and keep her on oral antibiotics till drains can be removed.  From my standpoint, the main limiting step is her ability to handle drains at home.  Apparently her family was not able to come up so that delayed discharge.  Wrote for home health but they cannot promise visiting every day.  She thought she had  to flush the drains.  I do not think she needs to flush them since they are serous  She declares that she can empty them at home.  Still see if we can get home health set up to check on her at least every other day.  I again strongly encouraged her to ask friends and family for support even if it is intermittent.  Once the output from the drains is less than 30 cc a day, hopefully can remove the drains.  Hopefully in the next 7-14 days.  -Multimodal pain control.  Scheduled Tylenol.  No NSAIDs with her elevated creatinine.  Methocarbamol and oral and IV narcotics as backup.  Chronic anemia stabilizing/improving.  She is definitely mobilizing better and denies much pain.  That is a hopeful sign.  Anxiolysis  Hypertension -angiotensin inhibitor.  Medicine help appreciated  Hypothyroidism back on home levothyroxine  Hyperlipidemia controlled and follow   -Disposition:  Disposition:  The patient is from: Home Anticipate discharge to:  Home Anticipated Date of Discharge is:  August 31,2024   Barriers to discharge:  Pending Clinical improvement (more likely than not)  Patient currently is NOT MEDICALLY STABLE for discharge from the hospital from a surgery standpoint.      I reviewed nursing notes, last 24 h vitals and pain scores, last 48 h intake and output, last 24 h labs and trends, and last 24 h imaging results.  I have reviewed this patient's available data, including medical history, events of note, test results, etc as part of my evaluation.   A significant portion of that time was spent in counseling. Care during the described time interval was provided by me.  This care required moderate level of medical decision making.  04/23/2023    Subjective: (Chief complaint)  Patient denies much pain.  Felt a little nauseated this morning.  No vomiting.  Has been passing gas and moving her bowels  Worried about flushing the drains at home.  Apparently family could not come up  and help her out over Labor Day weekend after all   Objective:  Vital signs:  Vitals:   04/22/23 0948 04/22/23 1349 04/22/23 2121 04/23/23 0608  BP: (!) 150/64 136/65 (!) 151/68 (!) 156/78  Pulse: 69 79 72 68  Resp:  15 17 16   Temp:  98 F (36.7 C) 98.9 F (37.2 C) 98 F (36.7 C)  TempSrc:  Oral Oral Oral  SpO2:  97% 98% 98%  Weight:      Height:        Last BM Date : 04/21/23  Intake/Output   Yesterday:  09/02 0701 - 09/03 0700 In: 1353.1 [P.O.:1030; I.V.:3.1; IV Piggyback:300] Out: 737 [Urine:500; Drains:237] This shift:  No intake/output data recorded.  Bowel function:  Flatus: YES  BM:  No - last 9/1  Drain: Serous   Physical Exam:  General: Pt awake/alert in no acute distress.  Calm and talkative.  Little anxious.  Got out  of bed by herself pretty easily.   Eyes: PERRL, normal EOM.  Sclera clear.  No icterus Neuro: CN II-XII intact w/o focal sensory/motor deficits. Lymph: No head/neck/groin lymphadenopathy Psych:  No delerium/psychosis/paranoia.  Oriented x 4 HENT: Normocephalic, Mucus membranes moist.  No thrush Neck: Supple, No tracheal deviation.  No obvious thyromegaly Chest: No pain to chest wall compression.  Good respiratory excursion.  No audible wheezing CV:  Pulses intact.  Regular rhythm.  No major extremity edema MS: Normal AROM mjr joints.  No obvious deformity  Abdomen: Soft.  Nondistended.  Nontender.  Minimal ecchymosis nearly resolved.  No cellulitis.  No drainage.  Panniculectomy incision clean dry intact.  I had removed all her Steri-Strips   Ext:   No deformity.  No mjr edema.  No cyanosis Skin: No petechiae / purpurea.  No major sores.  Warm and dry    Results:   Cultures: Recent Results (from the past 720 hour(s))  Blood Culture (routine x 2)     Status: None   Collection Time: 04/17/23  7:42 PM   Specimen: BLOOD  Result Value Ref Range Status   Specimen Description   Final    BLOOD LEFT ANTECUBITAL Performed at [facility], [address] [place], [place] [address]    Special Requests   Final    BOTTLES DRAWN AEROBIC AND ANAEROBIC Blood Culture adequate volume Performed at [facility], [address] [place], [place] [address]    Culture   Final    NO GROWTH 5 DAYS Performed at [facility], [address] [place], [place] [address]    Report Status 04/22/2023 FINAL  Final  Blood Culture (routine x 2)     Status: None   Collection Time: 04/17/23  8:06 PM   Specimen: BLOOD  Result Value Ref Range Status   Specimen Description   Final    BLOOD LEFT ANTECUBITAL Performed at [facility], [address], [place], [place] [address]    Special Requests   Final    BOTTLES DRAWN AEROBIC AND ANAEROBIC Blood Culture adequate volume Performed at [facility], [address] [place], [place] [address]    Culture   Final    NO GROWTH 5 DAYS Performed at [facility], [address], [place], [place] [address]    Report Status 04/22/2023 FINAL  Final  SARS Coronavirus 2 by RT PCR (hospital order, performed in [facility] hospital lab) *cepheid single result test* Anterior Nasal Swab     Status: None   Collection Time: 04/17/23  8:25 PM   Specimen: Anterior Nasal Swab  Result Value Ref Range Status   SARS Coronavirus 2 by RT PCR NEGATIVE NEGATIVE Final    Comment: (NOTE) SARS-CoV-2 target nucleic acids are NOT DETECTED.  The SARS-CoV-2 RNA is generally detectable in upper and lower respiratory specimens during the acute phase of infection. The lowest concentration of SARS-CoV-2 viral copies this assay can detect is 250 copies / mL. A negative result does not preclude SARS-CoV-2 infection and should not be used as the sole basis for treatment or other patient management decisions.  A negative result may occur with improper specimen collection / handling, submission of specimen other than nasopharyngeal swab,  presence of viral mutation(s) within the areas targeted by this assay, and inadequate number of viral copies (<250 copies / mL). A negative result must be combined with clinical observations, patient history, and epidemiological information.  Fact Sheet for Patients:   RoadLapTop.co.za  Fact Sheet for Healthcare Providers: http://kim-miller.com/  This test is not yet approved or  cleared by the [place] FDA and has been authorized for detection and/or diagnosis of SARS-CoV-2 by FDA under an Emergency Use Authorization (EUA).  This EUA will remain in effect (meaning this test can be used) for the duration of the COVID-19 declaration under Section 564(b)(1) of the Act, 21 U.S.C. section 360bbb-3(b)(1), unless the authorization is terminated or revoked sooner.  Performed at [facility], [address], [place], [place] [address]   Respiratory (~20 pathogens) panel by PCR     Status: None   Collection Time: 04/18/23 12:06 PM   Specimen: Nasopharyngeal Swab; Respiratory  Result Value Ref Range Status   Adenovirus NOT DETECTED NOT DETECTED Final   Coronavirus 229E NOT DETECTED NOT DETECTED Final    Comment: (NOTE) The Coronavirus on the Respiratory Panel, DOES NOT test for the novel  Coronavirus (2019 nCoV)    Coronavirus HKU1 NOT DETECTED NOT DETECTED Final   Coronavirus NL63 NOT DETECTED NOT DETECTED Final   Coronavirus OC43 NOT DETECTED NOT DETECTED Final   Metapneumovirus NOT DETECTED NOT DETECTED Final   Rhinovirus / Enterovirus NOT DETECTED NOT DETECTED Final   Influenza A NOT DETECTED NOT DETECTED Final   Influenza B NOT DETECTED NOT DETECTED Final   Parainfluenza Virus 1 NOT DETECTED NOT DETECTED Final   Parainfluenza Virus 2 NOT DETECTED NOT DETECTED Final   Parainfluenza Virus 3 NOT DETECTED NOT DETECTED Final   Parainfluenza Virus 4 NOT DETECTED NOT DETECTED Final   Respiratory Syncytial Virus NOT  DETECTED NOT DETECTED Final   Bordetella pertussis NOT DETECTED NOT DETECTED Final   Bordetella Parapertussis NOT DETECTED NOT DETECTED Final   Chlamydophila pneumoniae NOT DETECTED NOT DETECTED Final   Mycoplasma pneumoniae NOT DETECTED NOT DETECTED Final    Comment: Performed at [facility], [address] [place], [place] [address]  Aerobic/Anaerobic Culture w Gram Stain (surgical/deep wound)     Status: None (Preliminary result)   Collection Time: 04/18/23  3:14 PM   Specimen: Wound; Serous Fluid  Result Value Ref Range Status   Specimen Description   Final    WOUND ABDOMEN Performed at [facility], [address], [place], [place] [address]    Special Requests   Final    NONE Performed at [facility], [address] [place], [place] [address]    Gram Stain   Final    MODERATE WBC PRESENT, PREDOMINANTLY PMN NO ORGANISMS SEEN Performed at [facility], [address], [place], [place] [address]    Culture   Final    FEW STAPHYLOCOCCUS AUREUS NO ANAEROBES ISOLATED; CULTURE IN PROGRESS FOR 5 DAYS    Report Status PENDING  Incomplete   Organism ID, Bacteria STAPHYLOCOCCUS AUREUS  Final      Susceptibility   Staphylococcus aureus - MIC*    CIPROFLOXACIN <=0.5 SENSITIVE Sensitive     ERYTHROMYCIN <=0.25 SENSITIVE Sensitive     GENTAMICIN <=0.5 SENSITIVE Sensitive     OXACILLIN 0.5 SENSITIVE Sensitive     TETRACYCLINE >=16 RESISTANT Resistant     VANCOMYCIN 1 SENSITIVE Sensitive     TRIMETH/SULFA <=10 SENSITIVE Sensitive     CLINDAMYCIN <=0.25 SENSITIVE Sensitive     RIFAMPIN <=0.5 SENSITIVE Sensitive     Inducible Clindamycin NEGATIVE Sensitive     LINEZOLID 2 SENSITIVE Sensitive     * FEW  STAPHYLOCOCCUS AUREUS    Labs: Results for orders placed or performed during the hospital encounter of 04/17/23 (from the past 48 hour(s))  Basic metabolic panel     Status: Abnormal   Collection Time: 04/23/23  3:33 AM  Result Value Ref Range    Sodium 135 135 - 145 mmol/L   Potassium 3.4 (L) 3.5 - 5.1 mmol/L   Chloride 101 98 - 111 mmol/L   CO2 24 22 - 32 mmol/L   Glucose, Bld 94 70 - 99 mg/dL    Comment: Glucose reference range applies only to samples taken after fasting for at least 8 hours.   BUN 10 8 - 23 mg/dL   Creatinine, Ser 3.08 0.44 - 1.00 mg/dL   Calcium 8.4 (L) 8.9 - 10.3 mg/dL   GFR, Estimated >65 >78 mL/min    Comment: (NOTE) Calculated using the CKD-EPI Creatinine Equation (2021)    Anion gap 10 5 - 15    Comment: Performed at [facility], [address], [place], [place] [address]    Imaging / Studies: No results found.  Medications / Allergies: per chart  Antibiotics: Anti-infectives (From admission, onward)    Start     Dose/Rate Route Frequency Ordered Stop   04/21/23 1800  ceFAZolin (ANCEF) IVPB 2g/100 mL premix  Status:  Discontinued        2 g 200 mL/hr over 30 Minutes Intravenous Every 8 hours 04/21/23 1645 04/21/23 1645   04/21/23 1800  ceFAZolin (ANCEF) IVPB 2g/100 mL premix        2 g 200 mL/hr over 30 Minutes Intravenous Every 8 hours 04/21/23 1646     04/21/23 0200  ceFEPIme (MAXIPIME) 2 g in sodium chloride 0.9 % 100 mL IVPB  Status:  Discontinued        2 g 200 mL/hr over 30 Minutes Intravenous Every 8 hours 04/20/23 1911 04/21/23 1643   04/20/23 2200  vancomycin (VANCOREADY) IVPB 1250 mg/250 mL  Status:  Discontinued        1,250 mg 166.7 mL/hr over 90 Minutes Intravenous Every 24 hours 04/20/23 1825 04/21/23 1643   04/19/23 2200  vancomycin (VANCOCIN) IVPB 1000 mg/200 mL premix  Status:  Discontinued        1,000 mg 200 mL/hr over 60 Minutes Intravenous Every 24 hours 04/19/23 0808 04/20/23 1824   04/19/23 1800  ceFEPIme (MAXIPIME) 2 g in sodium chloride 0.9 % 100 mL IVPB  Status:  Discontinued        2 g 200 mL/hr over 30 Minutes Intravenous Every 12 hours 04/19/23 0808 04/20/23 1911   04/18/23 2200  vancomycin (VANCOREADY) IVPB 1250 mg/250 mL  Status:   Discontinued        1,250 mg 166.7 mL/hr over 90 Minutes Intravenous Every 24 hours 04/18/23 1233 04/19/23 0808   04/18/23 1500  ceFAZolin (ANCEF) IVPB 2g/100 mL premix  Status:  Discontinued        2 g 200 mL/hr over 30 Minutes Intravenous Every 8 hours 04/18/23 1127 04/18/23 1314   04/18/23 1415  ceFEPIme (MAXIPIME) 2 g in sodium chloride 0.9 % 100 mL IVPB  Status:  Discontinued        2 g 200 mL/hr over 30 Minutes Intravenous Every 8 hours 04/18/23 1318 04/19/23 0808   04/18/23 1400  ceFEPIme (MAXIPIME) 2 g in sodium chloride 0.9 % 100 mL IVPB  Status:  Discontinued        2 g 200 mL/hr over 30 Minutes Intravenous Every 8  hours 04/18/23 1027 04/18/23 1127   04/18/23 1045  piperacillin-tazobactam (ZOSYN) IVPB 3.375 g  Status:  Discontinued        3.375 g 12.5 mL/hr over 240 Minutes Intravenous Every 8 hours 04/18/23 0958 04/18/23 0959   04/18/23 1045  ceFEPIme (MAXIPIME) 2 g in sodium chloride 0.9 % 100 mL IVPB  Status:  Discontinued        2 g 200 mL/hr over 30 Minutes Intravenous Every 8 hours 04/18/23 0959 04/18/23 1027   04/18/23 1045  metroNIDAZOLE (FLAGYL) IVPB 500 mg  Status:  Discontinued        500 mg 100 mL/hr over 60 Minutes Intravenous Every 12 hours 04/18/23 0959 04/18/23 1127   04/18/23 0600  ceFEPIme (MAXIPIME) 2 g in sodium chloride 0.9 % 100 mL IVPB        2 g 200 mL/hr over 30 Minutes Intravenous  Once 04/18/23 0513 04/18/23 0729   04/17/23 2045  vancomycin (VANCOREADY) IVPB 2000 mg/400 mL        2,000 mg 200 mL/hr over 120 Minutes Intravenous  Once 04/17/23 2030 04/17/23 2317   04/17/23 2045  ceFEPIme (MAXIPIME) 2 g in sodium chloride 0.9 % 100 mL IVPB        2 g 200 mL/hr over 30 Minutes Intravenous  Once 04/17/23 2036 04/17/23 2143   04/17/23 2045  metroNIDAZOLE (FLAGYL) IVPB 500 mg        500 mg 100 mL/hr over 60 Minutes Intravenous  Once 04/17/23 2036 04/17/23 2250         Note: Portions of this report may have been transcribed using voice recognition  software. Every effort was made to ensure accuracy; however, inadvertent computerized transcription errors may be present.   Any transcriptional errors that result from this process are unintentional.    Ardeth Sportsman, MD, FACS, MASCRS Esophageal, Gastrointestinal & Colorectal Surgery Robotic and Minimally Invasive Surgery  [facility] [address], [address] [place], [place] [address] [phone] Fax [phone] Main  CONTACT INFORMATION: Weekday (9AM-5PM): Call [facility] main office at [phone] Weeknight (5PM-9AM) or Weekend/Holiday: Check EPIC "Web Links" tab & use "AMION" (password " [email or web address]") for General Surgery [facility] coverage  Please, DO NOT use SecureChat  (it is not reliable communication to reach operating surgeons & will lead to a delay in care).   Epic staff messaging available for outptient concerns needing 1-2 business day response.      04/23/2023  8:03 AM

## 2023-04-23 NOTE — Discharge Summary (Addendum)
Physician Discharge Summary    Jacqueline Mora MRN: [id number] DOB/AGE: 68-Jul-1957 = 68 y.o.  Patient Care Team: Jarrett Soho, PA-C as PCP - General (Family Medicine) Christell Constant, MD as PCP - Cardiology (Cardiology) Karie Soda, MD as Consulting Physician (General Surgery)  Admit date: 04/17/2023  Discharge date: 04/23/2023  Hospital Stay = 5 days    Discharge Diagnoses:  Principal Problem:   Fever postop Active Problems:   Morbid obesity (HCC)   Essential hypertension   Hyperlipidemia   Recurrent incisional hernia   Prediabetes   IBS (irritable bowel syndrome)   GERD (gastroesophageal reflux disease)   Anxiety   Hypokalemia   Nausea & vomiting   Abscess of abdominal wall   CKD (chronic kidney disease) stage 2, GFR 60-89 ml/min      * No surgery found *    Consults: Case Management / Social Work, Physical Therapy, Occupational Therapy, Pharmacy, Nutrition, and Internal Medicine (Hospitalist)  Hospital Course:   Patient with recurrent hernia and prior mesh infections and bowel resection in the distant past with recurrent incarcerated hernia.  On room combination lap assisted and primary repair with panniculectomy.  Initially did well but then came back with nausea and vomiting diarrhea and fevers.  Had abdominal seromas that were draining and appeared serous.  Placed on antibiotics.  No longer had any fevers nor elevated white count within 2 days.  Drain cultures eventually grew out methicillin Staph aureus on the third day.  Hypokalemia was corrected.  She was rehydrated.  Medicine was consulted to make sure were not missing anything else.  Postoperatively, the patient gradually mobilized and advanced to a solid diet.  Pain and other symptoms were treated aggressively.    By the time of discharge, the patient was walking well the hallways, eating food, having flatus.  Pain was well-controlled on an oral medications.  Back to her baseline heartburn reflux  with occasional nausea controlled medications.  Issues with her managing her drains and lack of family/friends support, delaying her discharge.  However she seemed to improve and stabilized.  Home health was able to be cobbled somewhat.  Patient noted she would have friends and families trying to come up and help as well.  Based on meeting discharge criteria and continuing to recover, I felt it was safe for the patient to be discharged from the hospital to further recover with close followup. Postoperative recommendations were discussed in detail.  They are written as well.  Discharged Condition: stable  Discharge Exam: Blood pressure (!) 156/78, pulse 68, temperature 98 F (36.7 C), temperature source Oral, resp. rate 16, height 5' 5.5" (1.664 m), weight 127 kg, SpO2 98%.  General: Pt awake/alert/oriented x4 in No acute distress Eyes: PERRL, normal EOM.  Sclera clear.  No icterus Neuro: CN II-XII intact w/o focal sensory/motor deficits. Lymph: No head/neck/groin lymphadenopathy Psych:  No delerium/psychosis/paranoia HENT: Normocephalic, Mucus membranes moist.  No thrush Neck: Supple, No tracheal deviation Chest:  No chest wall pain w good excursion CV:  Pulses intact.  Regular rhythm MS: Normal AROM mjr joints.  No obvious deformity Abdomen: Soft.  Nondistended.  Nontender.  Incision clean dry intact without any drainage.  No evidence of peritonitis.  No incarcerated hernias. Ext:  SCDs BLE.  No mjr edema.  No cyanosis Skin: No petechiae / purpura   Disposition:    Follow-up Information     Karie Soda, MD Follow up in 2 week(s).   Specialties: General Surgery, Colon and Rectal Surgery Why:  To follow up after your hospital stay Contact information: [address] [place] [address] [phone]         [facility] Follow up.   Specialty: Home Health Services Why: to provide home health nursing visits (anticipate 2-3 x/ week) Contact  information: [address] [place] [address] [phone]         [facility], [place]. Call in 3 day(s).   Specialty: General Surgery Why: To have your drain output discussed & plan removal in 7-14 days after discharge Contact information: [address] [place] [address] [phone]                Discharge disposition: 01-Home or Self Care       Discharge Instructions     Call MD for:   Complete by: As directed    FEVER > 101.5 F  (temperatures < 101.5 F are not significant)   Call MD for:  extreme fatigue   Complete by: As directed    Call MD for:  persistant dizziness or light-headedness   Complete by: As directed    Call MD for:  persistant nausea and vomiting   Complete by: As directed    Call MD for:  redness, tenderness, or signs of infection (pain, swelling, redness, odor or green/yellow discharge around incision site)   Complete by: As directed    Call MD for:  severe uncontrolled pain   Complete by: As directed    Diet - low sodium heart healthy   Complete by: As directed    Start with a bland diet such as soups, liquids, starchy foods, low fat foods, etc. the first few days at home. Gradually advance to a solid, low-fat, high fiber diet by the end of the first week at home.   Add a fiber supplement to your diet (Metamucil, etc) If you feel full, bloated, or constipated, stay on a full liquid or pureed/blenderized diet for a few days until you feel better and are no longer constipated.   Discharge instructions   Complete by: As directed    See Discharge Instructions If you are not getting better after two weeks or are noticing you are getting worse, contact our office [phone] for further advice.  We may need to adjust your medications, re-evaluate you in the office, send you to the emergency room, or see what other things we can do to help. The clinic staff is available to answer your  questions during regular business hours (8:30am-5pm).  Please don't hesitate to call and ask to speak to one of our nurses for clinical concerns.    A surgeon from [facility] is always on call at the hospitals 24 hours/day If you have a medical emergency, go to the nearest emergency room or call 911.   Discharge wound care:   Complete by: As directed    It is good for closed incisions and even open wounds to be washed every day.  Shower every day.  Short baths are fine.  Wash the incisions and wounds clean with soap & water.    You may leave closed incisions open to air if it is dry.   You may cover the incision with clean gauze & replace it after your daily shower for comfort.  DRAIN:  You have a drain in place.  Every day change the dressing in the shower, wash around the skin exit site with  soap & water and place a new dressing of gauze or band aid around the skin every day.  Keep the drain site clean & dry.  Follow up in our surgery office to discuss plans for drain removal in the near future   Driving Restrictions   Complete by: As directed    You may drive when: - you are no longer taking narcotic prescription pain medication - you can comfortably wear a seatbelt - you can safely make sudden turns/stops without pain.   Increase activity slowly   Complete by: As directed    Start light daily activities --- self-care, walking, climbing stairs- beginning the day after surgery.  Gradually increase activities as tolerated.  Control your pain to be active.  Stop when you are tired.  Ideally, walk several times a day, eventually an hour a day.   Most people are back to most day-to-day activities in a few weeks.  It takes 4-6 weeks to get back to unrestricted, intense activity. If you can walk 30 minutes without difficulty, it is safe to try more intense activity such as jogging, treadmill, bicycling, low-impact aerobics, swimming, etc. Save the most intensive and strenuous activity  for last (Usually 4-8 weeks after surgery) such as sit-ups, heavy lifting, contact sports, etc.  Refrain from any intense heavy lifting or straining until you are off narcotics for pain control.  You will have off days, but things should improve week-by-week. DO NOT PUSH THROUGH PAIN.  Let pain be your guide: If it hurts to do something, don't do it.   Lifting restrictions   Complete by: As directed    If you can walk 30 minutes without difficulty, it is safe to try more intense activity such as jogging, treadmill, bicycling, low-impact aerobics, swimming, etc. Save the most intensive and strenuous activity for last (Usually 4-8 weeks after surgery) such as sit-ups, heavy lifting, contact sports, etc.   Refrain from any intense heavy lifting or straining until you are off narcotics for pain control.  You will have off days, but things should improve week-by-week. DO NOT PUSH THROUGH PAIN.  Let pain be your guide: If it hurts to do something, don't do it.  Pain is your body warning you to avoid that activity for another week until the pain goes down.   May shower / Bathe   Complete by: As directed    May walk up steps   Complete by: As directed    Sexual Activity Restrictions   Complete by: As directed    You may have sexual intercourse when it is comfortable. If it hurts to do something, stop.       Allergies as of 04/23/2023       Reactions   Amlodipine Besylate Other (See Comments)   Dizziness   Eszopiclone    Other reaction(s): Crazy dreams   Tape    Adhesive on medical tape and glue burns skin - requires paper tape.   Zocor [simvastatin]    myalgias   Clindamycin Hcl Rash   Meloxicam Rash   Methylprednisolone Rash   Penicillin G Rash   Tolerates cefalosporins        Medication List     STOP taking these medications    losartan 100 MG tablet Commonly known as: COZAAR       TAKE these medications    ALPRAZolam 0.5 MG tablet Commonly known as: XANAX Take 0.5 mg by  mouth as needed for anxiety.   B-12 PO Take 1  tablet by mouth 4 (four) times a week.   cefadroxil 500 MG/5ML suspension Commonly known as: DURICEF Take 10 mLs (1,000 mg total) by mouth every 12 (twelve) hours for 14 days.   cholecalciferol 1000 units tablet Commonly known as: VITAMIN D Take 1,000 Units by mouth 4 (four) times a week.   estradiol 1 MG tablet Commonly known as: ESTRACE Take 1 mg by mouth daily.   ezetimibe 10 MG tablet Commonly known as: ZETIA Take 10 mg by mouth daily.   FLUoxetine 10 MG capsule Commonly known as: PROZAC Take 20 mg by mouth daily.   fluticasone 50 MCG/ACT nasal spray Commonly known as: FLONASE Place 1 spray into both nostrils daily as needed for allergies.   furosemide 40 MG tablet Commonly known as: LASIX TAKE 1 TABLET(40 MG TOTAL) BY MOUTH DAILY   ketoconazole 2 % cream Commonly known as: NIZORAL Apply 1 Application topically daily.   levothyroxine 150 MCG tablet Commonly known as: SYNTHROID Take 150 mcg by mouth daily before breakfast.   methocarbamol 500 MG tablet Commonly known as: ROBAXIN Take 1-2 tablets (500-1,000 mg total) by mouth every 8 (eight) hours as needed for muscle spasms.   ondansetron 4 MG disintegrating tablet Commonly known as: ZOFRAN-ODT Take 1 tablet (4 mg total) by mouth every 6 (six) hours as needed for nausea.   Oxycodone HCl 10 MG Tabs Take 1-1.5 tablets (10-15 mg total) by mouth every 6 (six) hours as needed for severe pain or breakthrough pain.   potassium chloride SA 20 MEQ tablet Commonly known as: KLOR-CON M Take 2 tablets (40 mEq total) by mouth daily for 5 days.   RABEprazole 20 MG tablet Commonly known as: ACIPHEX Take 20 mg by mouth daily.   rosuvastatin 10 MG tablet Commonly known as: CRESTOR Take 1 tablet (10 mg total) by mouth daily.   spironolactone 25 MG tablet Commonly known as: ALDACTONE TAKE ONE TABLET BY MOUTH DAILY DOSE INCREASE   Wegovy 0.25 MG/0.5ML Soaj Generic drug:  Semaglutide-Weight Management Inject 0.25 mg into the skin once a week.       ASK your doctor about these medications    ondansetron 4 MG disintegrating tablet Commonly known as: ZOFRAN-ODT Take 4 mg by mouth every 8 (eight) hours as needed for nausea or vomiting. Ask about: Should I take this medication?               Discharge Care Instructions  (From admission, onward)           Start     Ordered   04/19/23 0000  Discharge wound care:       Comments: It is good for closed incisions and even open wounds to be washed every day.  Shower every day.  Short baths are fine.  Wash the incisions and wounds clean with soap & water.    You may leave closed incisions open to air if it is dry.   You may cover the incision with clean gauze & replace it after your daily shower for comfort.  DRAIN:  You have a drain in place.  Every day change the dressing in the shower, wash around the skin exit site with soap & water and place a new dressing of gauze or band aid around the skin every day.  Keep the drain site clean & dry.  Follow up in our surgery office to discuss plans for drain removal in the near future   04/19/23 1308  Significant Diagnostic Studies:  Results for orders placed or performed during the hospital encounter of 04/17/23 (from the past 72 hour(s))  CBC     Status: Abnormal   Collection Time: 04/21/23  2:50 AM  Result Value Ref Range   WBC 6.2 4.0 - 10.5 K/uL   RBC 2.91 (L) 3.87 - 5.11 MIL/uL   Hemoglobin 8.4 (L) 12.0 - 15.0 g/dL   HCT 16.1 (L) 09.6 - 04.5 %   MCV 94.8 80.0 - 100.0 fL   MCH 28.9 26.0 - 34.0 pg   MCHC 30.4 30.0 - 36.0 g/dL   RDW 40.9 (H) 81.1 - 91.4 %   Platelets 287 150 - 400 K/uL   nRBC 0.0 0.0 - 0.2 %    Comment: Performed at [facility], [address] [place], [place] [address]  Basic metabolic panel     Status: Abnormal   Collection Time: 04/21/23  2:50 AM  Result Value Ref Range   Sodium 136 135 -  145 mmol/L   Potassium 4.9 3.5 - 5.1 mmol/L   Chloride 108 98 - 111 mmol/L   CO2 20 (L) 22 - 32 mmol/L   Glucose, Bld 81 70 - 99 mg/dL    Comment: Glucose reference range applies only to samples taken after fasting for at least 8 hours.   BUN 14 8 - 23 mg/dL   Creatinine, Ser 6.21 0.44 - 1.00 mg/dL   Calcium 8.3 (L) 8.9 - 10.3 mg/dL   GFR, Estimated >30 >86 mL/min    Comment: (NOTE) Calculated using the CKD-EPI Creatinine Equation (2021)    Anion gap 8 5 - 15    Comment: Performed at [facility], [address], [place], [place] [address]  Magnesium     Status: None   Collection Time: 04/21/23  2:50 AM  Result Value Ref Range   Magnesium 2.1 1.7 - 2.4 mg/dL    Comment: Performed at [facility] [place], [address], [place], [place] [address]  Basic metabolic panel     Status: Abnormal   Collection Time: 04/23/23  3:33 AM  Result Value Ref Range   Sodium 135 135 - 145 mmol/L   Potassium 3.4 (L) 3.5 - 5.1 mmol/L   Chloride 101 98 - 111 mmol/L   CO2 24 22 - 32 mmol/L   Glucose, Bld 94 70 - 99 mg/dL    Comment: Glucose reference range applies only to samples taken after fasting for at least 8 hours.   BUN 10 8 - 23 mg/dL   Creatinine, Ser 2.84 0.44 - 1.00 mg/dL   Calcium 8.4 (L) 8.9 - 10.3 mg/dL   GFR, Estimated >13 >24 mL/min    Comment: (NOTE) Calculated using the CKD-EPI Creatinine Equation (2021)    Anion gap 10 5 - 15    Comment: Performed at [facility], [address], [place], [place] [address]    [place] Abscess Drain  Result Date: 04/18/2023 INDICATION: 67 year old with large abdominal subcutaneous fluid collections following panniculectomy and repair of abdominal hernia. EXAM: 1. Placement of percutaneous drain in the right abdominal subcutaneous fluid collection 2. Placement of percutaneous drain in the left abdominal subcutaneous fluid collection MEDICATIONS: Moderate sedation ANESTHESIA/SEDATION: Moderate  (conscious) sedation was employed during this procedure. A total of Versed 2mg  and fentanyl 100 mcg was administered intravenously at the order of the provider performing the procedure. Total intra-service moderate sedation time: 40 minutes. Patient's level of consciousness and vital signs were monitored  continuously by radiology nurse throughout the procedure under the supervision of the provider performing the procedure. COMPLICATIONS: None immediate. PROCEDURE: Informed written consent was obtained from the patient after a thorough discussion of the procedural risks, benefits and alternatives. All questions were addressed. A timeout was performed prior to the initiation of the procedure. Both sides of the abdomen were obtained. Large subcutaneous fluid collections were identified near the surgical incisions on both sides of the abdomen. The left and right side of the abdomen were prepped with chlorhexidine and sterile field was created. Skin was anesthetized along the right side of the abdomen with 1% lidocaine and a small incision was made. Using ultrasound guidance, a Yueh catheter was directed into the large subcutaneous fluid collection and serosanguineous fluid was aspirated. Superstiff Amplatz wire was advanced into the collection. The tract was dilated to accommodate a 12 [place] drain. Large amount of serosanguineous fluid was removed. At one point, the drain needed to be flush to continue aspirating fluid. Drain was attached to a suction bulb. Ultrasound demonstrated a large amount of residual subcutaneous fluid along the left side of the abdomen. Therefore, attention was directed to the left side of the abdominal subcutaneous fluid collection. The left side of the abdomen was anesthetized with 1% lidocaine and a small incision was made. Using ultrasound guidance, a Yueh catheter was directed into the subcutaneous fluid collection and serosanguineous fluid was aspirated. Superstiff Amplatz wire was  advanced into the collection and the tract was dilated to accommodate a 12 [place] drain. Additional serosanguineous fluid was removed. Both drains were attached to suction bulbs. Sample sent for culture. Both drains were sutured to skin and sterile dressings were placed. FINDINGS: Large complex fluid collections in the anterior abdomen most prominent along the lateral aspects of the abdomen. Bilateral 12 French drains were placed. Greater than 900 mL of serosanguineous fluid was removed from these collections. The subcutaneous fluid collections were markedly decreased by the end of the procedure. IMPRESSION: Ultrasound-guided placement of two abdominal subcutaneous drainage catheters. Greater than 900 mL of serosanguineous fluid was removed from the abdominal subcutaneous collections. Fluid was sent for culture. Electronically Signed   By: Richarda Overlie M.D.   On: 04/18/2023 16:39   [place] Abscess Drain  Result Date: 04/18/2023 INDICATION: 67 year old with large abdominal subcutaneous fluid collections following panniculectomy and repair of abdominal hernia. EXAM: 1. Placement of percutaneous drain in the right abdominal subcutaneous fluid collection 2. Placement of percutaneous drain in the left abdominal subcutaneous fluid collection MEDICATIONS: Moderate sedation ANESTHESIA/SEDATION: Moderate (conscious) sedation was employed during this procedure. A total of Versed 2mg  and fentanyl 100 mcg was administered intravenously at the order of the provider performing the procedure. Total intra-service moderate sedation time: 40 minutes. Patient's level of consciousness and vital signs were monitored continuously by radiology nurse throughout the procedure under the supervision of the provider performing the procedure. COMPLICATIONS: None immediate. PROCEDURE: Informed written consent was obtained from the patient after a thorough discussion of the procedural risks, benefits and alternatives. All questions were addressed. A  timeout was performed prior to the initiation of the procedure. Both sides of the abdomen were obtained. Large subcutaneous fluid collections were identified near the surgical incisions on both sides of the abdomen. The left and right side of the abdomen were prepped with chlorhexidine and sterile field was created. Skin was anesthetized along the right side of the abdomen with 1% lidocaine and a small incision was made. Using ultrasound guidance, a Yueh catheter was directed  into the large subcutaneous fluid collection and serosanguineous fluid was aspirated. Superstiff Amplatz wire was advanced into the collection. The tract was dilated to accommodate a 12 [place] drain. Large amount of serosanguineous fluid was removed. At one point, the drain needed to be flush to continue aspirating fluid. Drain was attached to a suction bulb. Ultrasound demonstrated a large amount of residual subcutaneous fluid along the left side of the abdomen. Therefore, attention was directed to the left side of the abdominal subcutaneous fluid collection. The left side of the abdomen was anesthetized with 1% lidocaine and a small incision was made. Using ultrasound guidance, a Yueh catheter was directed into the subcutaneous fluid collection and serosanguineous fluid was aspirated. Superstiff Amplatz wire was advanced into the collection and the tract was dilated to accommodate a 12 [place] drain. Additional serosanguineous fluid was removed. Both drains were attached to suction bulbs. Sample sent for culture. Both drains were sutured to skin and sterile dressings were placed. FINDINGS: Large complex fluid collections in the anterior abdomen most prominent along the lateral aspects of the abdomen. Bilateral 12 French drains were placed. Greater than 900 mL of serosanguineous fluid was removed from these collections. The subcutaneous fluid collections were markedly decreased by the end of the procedure. IMPRESSION: Ultrasound-guided  placement of two abdominal subcutaneous drainage catheters. Greater than 900 mL of serosanguineous fluid was removed from the abdominal subcutaneous collections. Fluid was sent for culture. Electronically Signed   By: Richarda Overlie M.D.   On: 04/18/2023 16:39   CT ABDOMEN PELVIS W CONTRAST  Result Date: 04/17/2023 CLINICAL DATA:  Ventral hernia open repair 2 weeks ago with mesh replacement, lower abdominal pain EXAM: CT ABDOMEN AND PELVIS WITH CONTRAST TECHNIQUE: Multidetector CT imaging of the abdomen and pelvis was performed using the standard protocol following bolus administration of intravenous contrast. RADIATION DOSE REDUCTION: This exam was performed according to the departmental dose-optimization program which includes automated exposure control, adjustment of the mA and/or kV according to patient size and/or use of iterative reconstruction technique. CONTRAST:  OMNIPAQUE IOHEXOL 300 MG/ML  SOLN COMPARISON:  01/04/2023 FINDINGS: Lower chest: Small type 1 hiatal hernia. Hepatobiliary: Unremarkable Pancreas: Unremarkable Spleen: Unremarkable Adrenals/Urinary Tract: Unremarkable Stomach/Bowel: Mast Modic staple line in the right small bowel (image 56, series 2) and in the rectum (image 74 series 2.). Normal appendix. No dilated bowel were substantially abnormal thick-walled bowel. Vascular/Lymphatic: Unremarkable Reproductive: Uterus absent.  Adnexa unremarkable. Other: No ascites. Musculoskeletal: The dominant complex ventral hernia has been repaired with mesh in place. There is edema and stranding along the mesh, not necessarily unexpected in this postoperative stage, there is also greater than expected nodularity in the omentum cephalad to the mesh for example on images 27 through 38 of series 2. Some of this may be from omental edema but the possibility of omental infarct or infectious process along the omentum is not totally excluded. Do not see any gas to indicate an abscess in this region. There  were two previous upper abdominal hernias containing adipose tissue, the most cephalad has mildly enlarged with herniated adipose tissue currently measuring 4.4 by 2.0 by 2.9 cm. The more caudad, which is probably covered by the mesh, now presents as small lobulations of fluid density in the subcutaneous tissues at the site of prior hernia for example on images 41 through 48 of series 4. I am uncertain if this represents residual hernia sac containing fluid versus simply some subcutaneous nodularity. There is a large tubular fluid density process along  the pannus along with reduction in volume of pannus, compatible with panniculectomy. Surrounding subcutaneous stranding is present. Miniscule loculation of gas along the right margin of this panniculectomy site on image 52 series 2, much less than 1 cc, possibly incidental. I do not see abnormal enhancement along the margins of the panniculectomy site to indicate an abscess. Correlate with quality of drainage at the wound site, if any. Lower lumbar spondylosis and degenerative disc disease causing right foraminal impingement at L4-5 and left foraminal impingement at L5-S1. Mildly transitional S1 vertebra. IMPRESSION: 1. Interval panniculectomy with a large tubular fluid density process along the pannus, but no rim enhancement to indicate abscess. Miniscule loculation of gas along the right margin of this panniculectomy site, much less than 1 cc, possibly incidental. Correlate with quality of drainage at the wound site, if any. 2. Interval repair of the dominant complex ventral hernia with mesh in place. There is edema and stranding along the mesh, not necessarily unexpected in this postoperative stage, but there is also greater than expected nodularity in the omentum cephalad to the mesh. Some of this may be from omental edema but the possibility of omental infarct or infectious process along the upper omentum is not totally excluded. 3. There were two previous upper  abdominal hernias containing adipose tissue, the most cephalad has mildly enlarged and at the site of the more caudad there is lobular fluid density in the subcutaneous tissues. 4. Small type 1 hiatal hernia. 5. Lower lumbar spondylosis and degenerative disc disease causing right foraminal impingement at L4-5 and left foraminal impingement at L5-S1. Electronically Signed   By: Gaylyn Rong M.D.   On: 04/17/2023 21:26   DG Chest Port 1 View  Result Date: 04/17/2023 CLINICAL DATA:  Sepsis, recent hernia repair EXAM: PORTABLE CHEST 1 VIEW COMPARISON:  04/06/2023 FINDINGS: Single frontal view of the chest demonstrates an unremarkable cardiac silhouette. Improved aeration at the lung bases. No airspace disease, effusion, or pneumothorax. No acute bony abnormalities. IMPRESSION: 1. No acute intrathoracic process. Electronically Signed   By: Sharlet Salina M.D.   On: 04/17/2023 20:07    Past Medical History:  Diagnosis Date   Abdominal hernia    Anemia    Anxiety    BMI 45.0-49.9, adult (HCC)    Cancer (HCC)    Colon cancer (HCC)    COVID-19    Depression    GERD (gastroesophageal reflux disease)    History of colon polyps    Hypercholesteremia    Hypertension    occ   Hypothyroidism    IBS (irritable bowel syndrome)    Mitral valve problem    Petechiae 04/04/2023   red patches after ankle   PONV (postoperative nausea and vomiting)    Prediabetes    Squamous cell carcinoma in situ (SCCIS) of skin    Thyroid disease    Vitamin D deficiency     Past Surgical History:  Procedure Laterality Date   ABDOMINAL HYSTERECTOMY     BLADDER NECK SUSPENSION     CESAREAN SECTION     x 2   colon cancer     COLONOSCOPY     HERNIA REPAIR     Mesh placed then later removed due to infection.   laparostomy     VENTRAL HERNIA REPAIR N/A 04/04/2023   Procedure: LAPAROSCOPIC VENTRAL WALL HERNIA WITH LYSIS OF ADHESIONS AND PANNICULECTOMY;  Surgeon: Karie Soda, MD;  Location: [facility];  Service:  General;  Laterality: N/A;    Social History  Socioeconomic History   Marital status: Widowed    Spouse name: Not on file   Number of children: 2   Years of education: 4 yrs college   Highest education level: Not on file  Occupational History   Occupation: Retired  Tobacco Use   Smoking status: Former    Types: Cigarettes   Smokeless tobacco: Never   Tobacco comments:    Quit 1984  Vaping Use   Vaping status: Never Used  Substance and Sexual Activity   Alcohol use: Not Currently    Comment: occasionally   Drug use: No   Sexual activity: Not on file  Other Topics Concern   Not on file  Social History Narrative   Lives at home with husband.   Right-handed.   4-5 cups unsweet tea per week.   Social Determinants of Health   Financial Resource Strain: Not on file  Food Insecurity: No Food Insecurity (04/18/2023)   Hunger Vital Sign    Worried About Running Out of Food in the Last Year: Never true    Ran Out of Food in the Last Year: Never true  Transportation Needs: No Transportation Needs (04/18/2023)   PRAPARE - Administrator, Civil Service (Medical): No    Lack of Transportation (Non-Medical): No  Physical Activity: Not on file  Stress: Not on file  Social Connections: Not on file  Intimate Partner Violence: Not At Risk (04/18/2023)   Humiliation, Afraid, Rape, and Kick questionnaire    Fear of Current or Ex-Partner: No    Emotionally Abused: No    Physically Abused: No    Sexually Abused: No    Family History  Problem Relation Age of Onset   Kidney cancer Mother    Diabetes Father     Current Facility-Administered Medications  Medication Dose Route Frequency Provider Last Rate Last Admin   0.9 %  sodium chloride infusion   Intravenous PRN Karie Soda, MD 10 mL/hr at 04/20/23 2113 New Bag at 04/20/23 2113   acetaminophen (TYLENOL) tablet 1,000 mg  1,000 mg Oral Trecia Rogers, MD   1,000 mg at 04/23/23 0615   ALPRAZolam (XANAX) tablet 0.5  mg  0.5 mg Oral PRN Berna Bue, MD       alum & mag hydroxide-simeth (MAALOX/MYLANTA) 200-200-20 MG/5ML suspension 30 mL  30 mL Oral Q6H PRN Karie Soda, MD   30 mL at 04/21/23 2127   bisacodyl (DULCOLAX) suppository 10 mg  10 mg Rectal Q12H PRN Karie Soda, MD       bismuth subsalicylate (PEPTO BISMOL) 262 MG/15ML suspension 30 mL  30 mL Oral Q8H PRN Karie Soda, MD       ceFAZolin (ANCEF) IVPB 2g/100 mL premix  2 g Intravenous Q8H Berton Mount I, MD 200 mL/hr at 04/23/23 0209 2 g at 04/23/23 0209   diphenhydrAMINE (BENADRYL) capsule 25 mg  25 mg Oral Q6H PRN Karie Soda, MD   25 mg at 04/19/23 2338   enoxaparin (LOVENOX) injection 40 mg  40 mg Subcutaneous Q24H Earl Many M, RPH   40 mg at 04/23/23 0802   ezetimibe (ZETIA) tablet 10 mg  10 mg Oral Daily Chevis Pretty III, MD   10 mg at 04/22/23 0951   FLUoxetine (PROZAC) capsule 20 mg  20 mg Oral Daily Phylliss Blakes A, MD   20 mg at 04/22/23 0950   fluticasone (FLONASE) 50 MCG/ACT nasal spray 1 spray  1 spray Each Nare Daily PRN Berna Bue, MD  furosemide (LASIX) tablet 20 mg  20 mg Oral Daily Burnadette Pop, MD   20 mg at 04/21/23 1030   HYDROmorphone (DILAUDID) injection 0.5 mg  0.5 mg Intravenous Q4H PRN Phylliss Blakes A, MD   0.5 mg at 04/22/23 0127   ipratropium-albuterol (DUONEB) 0.5-2.5 (3) MG/3ML nebulizer solution 3 mL  3 mL Nebulization Q6H PRN Karie Soda, MD       levothyroxine (SYNTHROID) tablet 150 mcg  150 mcg Oral Q0600 Chevis Pretty III, MD   150 mcg at 04/23/23 0615   losartan (COZAAR) tablet 100 mg  100 mg Oral Daily Phylliss Blakes A, MD   100 mg at 04/22/23 5784   magic mouthwash  15 mL Oral QID PRN Karie Soda, MD       menthol-cetylpyridinium (CEPACOL) lozenge 3 mg  1 lozenge Oral PRN Karie Soda, MD       naphazoline-glycerin (CLEAR EYES REDNESS) ophth solution 1-2 drop  1-2 drop Both Eyes QID PRN Karie Soda, MD       ondansetron [facility]) injection 4 mg  4 mg Intravenous Q6H PRN  Karie Soda, MD   4 mg at 04/23/23 0802   ondansetron (ZOFRAN) tablet 4 mg  4 mg Oral TID AC & HS Karie Soda, MD       ondansetron (ZOFRAN-ODT) disintegrating tablet 4 mg  4 mg Oral Q6H PRN Chevis Pretty III, MD       oxyCODONE (Oxy IR/ROXICODONE) immediate release tablet 5 mg  5 mg Oral Q6H PRN Berna Bue, MD   5 mg at 04/22/23 2128   pantoprazole (PROTONIX) EC tablet 40 mg  40 mg Oral BID Karie Soda, MD       phenol (CHLORASEPTIC) mouth spray 2 spray  2 spray Mouth/Throat PRN Karie Soda, MD       polyethylene glycol (MIRALAX / GLYCOLAX) packet 17 g  17 g Oral BID Karie Soda, MD   17 g at 04/20/23 2107   potassium chloride SA (KLOR-CON M) CR tablet 40 mEq  40 mEq Oral BID Karie Soda, MD       psyllium (HYDROCIL/METAMUCIL) 1 packet  1 packet Oral BID Karie Soda, MD   1 packet at 04/19/23 1014   simethicone (MYLICON) 40 MG/0.6ML suspension 80 mg  80 mg Oral QID PRN Karie Soda, MD       sodium chloride (OCEAN) 0.65 % nasal spray 1-2 spray  1-2 spray Each Nare Q6H PRN Karie Soda, MD       sodium chloride flush (NS) 0.9 % injection 5 mL  5 mL Intracatheter Q8H Richarda Overlie, MD   5 mL at 04/23/23 0615   spironolactone (ALDACTONE) tablet 25 mg  25 mg Oral Daily Phylliss Blakes A, MD   25 mg at 04/22/23 6962   traMADol (ULTRAM) tablet 50-100 mg  50-100 mg Oral Q6H PRN Karie Soda, MD         Allergies  Allergen Reactions   Amlodipine Besylate Other (See Comments)    Dizziness   Eszopiclone     Other reaction(s): Crazy dreams   Tape     Adhesive on medical tape and glue burns skin - requires paper tape.   Zocor [Simvastatin]     myalgias   Clindamycin Hcl Rash   Meloxicam Rash   Methylprednisolone Rash   Penicillin G Rash    Tolerates cefalosporins    Signed:   Ardeth Sportsman, MD, FACS, MASCRS Esophageal, Gastrointestinal & Colorectal Surgery Robotic and Minimally Invasive Surgery  [facility] [address], [address] [place], [place] [address] [phone] Fax [phone] Main  CONTACT INFORMATION: Weekday (9AM-5PM): Call [facility] main office at [phone] Weeknight (5PM-9AM) or Weekend/Holiday: Check EPIC "Web Links" tab & use "AMION" (password " [email or web address]") for General Surgery [facility] coverage  Please, DO NOT use SecureChat  (it is not reliable communication to reach operating surgeons & will lead to a delay in care).   Epic staff messaging available for outptient concerns needing 1-2 business day response.      04/23/2023, 8:28 AM

## 2023-04-23 NOTE — Progress Notes (Signed)
PROGRESS NOTE  Jacqueline Mora  RUE:[id number] DOB: July 14, 1956 DOA: 04/17/2023 PCP: Jarrett Soho, PA-C   Brief Narrative: Jacqueline Mora is a 67 year old female with history of morbid obesity, hypertension, recurrent ventral incisional incarcerated hernia who initially presented with fever, nausea, vomiting.  She recently had repair of the hernia by Dr. Michaell Cowing on 8/15.  IR was consulted and she underwent bilateral subcutaneous abdomen drains on 04/18/2023.  Wound culture grew MSSA.  Currently she is afebrile, hemodynamically stable.  She was on cefazolin, plan for discharge to home with oral cefadroxil today  Assessment & Plan:  Principal Problem:   Fever postop Active Problems:   Morbid obesity (HCC)   Essential hypertension   Hyperlipidemia   Recurrent incisional hernia   Prediabetes   IBS (irritable bowel syndrome)   GERD (gastroesophageal reflux disease)   Anxiety   Hypokalemia   Nausea & vomiting   Abscess of abdominal wall   CKD (chronic kidney disease) stage 2, GFR 60-89 ml/min  Hernia  wound infection with MSSA /fever: Presented with fever, nausea/vomiting.  She is  status post laparoscopic recurrent ventral hernia repair with panniculectomy on 8/15 by Dr. Elgie Collard.  IR was consulted,she underwent bilateral subcutaneous abdomen drains on 04/18/2023.  Wound culture grew MSSA.  Currently she is afebrile, hemodynamically stable.  Currently on cefazolin.  Blood cultures have not shown any growth.  No leukocytosis. We recommend cefadroxil on discharge which could be continued for 14 days.  Normocytic  anemia: No evidence of acute blood loss.  Currently hemoglobin stable in the range of 8.  Hypertension: Currently blood pressure stable.  Continue losartan  Hypothyroidism: Continue Synthyroid  Hyperlipidemia: On Zetia, Crestor  History of anxiety: On Xanax at home  Diastolic CHF:  Takes spironolactone, Lasix at home.  Had  significant bilateral lower extremity edema. Given  few doses  of iv lasix  Hypokalemia: Potassium supplemented  Morbid obesity: BMI of  45       DVT prophylaxis:enoxaparin (LOVENOX) injection 40 mg Start: 04/20/23 0800 SCDs Start: 04/18/23 0027     Code Status: Full Code  Procedures: Abdominal drain placement  Antimicrobials:  Anti-infectives (From admission, onward)    Start     Dose/Rate Route Frequency Ordered Stop   04/23/23 0000  cefadroxil (DURICEF) 500 MG/5ML suspension        1,000 mg Oral Every 12 hours 04/23/23 0822 05/07/23 2359   04/21/23 1800  ceFAZolin (ANCEF) IVPB 2g/100 mL premix  Status:  Discontinued        2 g 200 mL/hr over 30 Minutes Intravenous Every 8 hours 04/21/23 1645 04/21/23 1645   04/21/23 1800  ceFAZolin (ANCEF) IVPB 2g/100 mL premix        2 g 200 mL/hr over 30 Minutes Intravenous Every 8 hours 04/21/23 1646     04/21/23 0200  ceFEPIme (MAXIPIME) 2 g in sodium chloride 0.9 % 100 mL IVPB  Status:  Discontinued        2 g 200 mL/hr over 30 Minutes Intravenous Every 8 hours 04/20/23 1911 04/21/23 1643   04/20/23 2200  vancomycin (VANCOREADY) IVPB 1250 mg/250 mL  Status:  Discontinued        1,250 mg 166.7 mL/hr over 90 Minutes Intravenous Every 24 hours 04/20/23 1825 04/21/23 1643   04/19/23 2200  vancomycin (VANCOCIN) IVPB 1000 mg/200 mL premix  Status:  Discontinued        1,000 mg 200 mL/hr over 60 Minutes Intravenous Every 24 hours 04/19/23 0808 04/20/23 1824   04/19/23 1800  ceFEPIme (MAXIPIME) 2 g in sodium chloride 0.9 % 100 mL IVPB  Status:  Discontinued        2 g 200 mL/hr over 30 Minutes Intravenous Every 12 hours 04/19/23 0808 04/20/23 1911   04/18/23 2200  vancomycin (VANCOREADY) IVPB 1250 mg/250 mL  Status:  Discontinued        1,250 mg 166.7 mL/hr over 90 Minutes Intravenous Every 24 hours 04/18/23 1233 04/19/23 0808   04/18/23 1500  ceFAZolin (ANCEF) IVPB 2g/100 mL premix  Status:  Discontinued        2 g 200 mL/hr over 30 Minutes Intravenous Every 8 hours 04/18/23 1127 04/18/23 1314    04/18/23 1415  ceFEPIme (MAXIPIME) 2 g in sodium chloride 0.9 % 100 mL IVPB  Status:  Discontinued        2 g 200 mL/hr over 30 Minutes Intravenous Every 8 hours 04/18/23 1318 04/19/23 0808   04/18/23 1400  ceFEPIme (MAXIPIME) 2 g in sodium chloride 0.9 % 100 mL IVPB  Status:  Discontinued        2 g 200 mL/hr over 30 Minutes Intravenous Every 8 hours 04/18/23 1027 04/18/23 1127   04/18/23 1045  piperacillin-tazobactam (ZOSYN) IVPB 3.375 g  Status:  Discontinued        3.375 g 12.5 mL/hr over 240 Minutes Intravenous Every 8 hours 04/18/23 0958 04/18/23 0959   04/18/23 1045  ceFEPIme (MAXIPIME) 2 g in sodium chloride 0.9 % 100 mL IVPB  Status:  Discontinued        2 g 200 mL/hr over 30 Minutes Intravenous Every 8 hours 04/18/23 0959 04/18/23 1027   04/18/23 1045  metroNIDAZOLE (FLAGYL) IVPB 500 mg  Status:  Discontinued        500 mg 100 mL/hr over 60 Minutes Intravenous Every 12 hours 04/18/23 0959 04/18/23 1127   04/18/23 0600  ceFEPIme (MAXIPIME) 2 g in sodium chloride 0.9 % 100 mL IVPB        2 g 200 mL/hr over 30 Minutes Intravenous  Once 04/18/23 0513 04/18/23 0729   04/17/23 2045  vancomycin (VANCOREADY) IVPB 2000 mg/400 mL        2,000 mg 200 mL/hr over 120 Minutes Intravenous  Once 04/17/23 2030 04/17/23 2317   04/17/23 2045  ceFEPIme (MAXIPIME) 2 g in sodium chloride 0.9 % 100 mL IVPB        2 g 200 mL/hr over 30 Minutes Intravenous  Once 04/17/23 2036 04/17/23 2143   04/17/23 2045  metroNIDAZOLE (FLAGYL) IVPB 500 mg        500 mg 100 mL/hr over 60 Minutes Intravenous  Once 04/17/23 2036 04/17/23 2250       Subjective:  Seen and examined at bedside today.  Hemodynamically stable.  She has some nausea today.  Denies any abdomen pain.  Improvement in the lower extremity edema today after given after Lasix.  Objective: Vitals:   04/22/23 0948 04/22/23 1349 04/22/23 2121 04/23/23 0608  BP: (!) 150/64 136/65 (!) 151/68 (!) 156/78  Pulse: 69 79 72 68  Resp:  15 17 16    Temp:  98 F (36.7 C) 98.9 F (37.2 C) 98 F (36.7 C)  TempSrc:  Oral Oral Oral  SpO2:  97% 98% 98%  Weight:      Height:        Intake/Output Summary (Last 24 hours) at 04/23/2023 1044 Last data filed at 04/23/2023 0620 Gross per 24 hour  Intake 1013.13 ml  Output 737 ml  Net 276.13 ml  Filed Weights   04/17/23 1925  Weight: 127 kg    Examination:  General exam: Overall comfortable, not in distress,obese HEENT: PERRL Respiratory system:  no wheezes or crackles  Cardiovascular system: S1 & S2 heard, RRR.  Gastrointestinal system: Abdomen is nondistended, soft and nontender.  Linear horizontal surgical scar, 1 abdominal drain on each side  Central nervous system: Alert and oriented Extremities: improved lower extremity edema, no clubbing ,no cyanosis Skin: No rashes, no ulcers,no icterus     Data Reviewed: I have personally reviewed following labs and imaging studies  CBC: Recent Labs  Lab 04/17/23 1942 04/18/23 0343 04/19/23 0331 04/20/23 0325 04/21/23 0250  WBC 13.6* 17.2* 9.7 6.7 6.2  NEUTROABS 12.0*  --   --   --   --   HGB 11.2* 10.0* 9.0* 8.2* 8.4*  HCT 35.0* 32.7* 29.0* 26.7* 27.6*  MCV 90.9 93.2 94.5 94.0 94.8  PLT 330 305 226 233 287   Basic Metabolic Panel: Recent Labs  Lab 04/18/23 0342 04/18/23 0343 04/19/23 0331 04/20/23 0325 04/21/23 0250 04/23/23 0333  NA  --  135 136 136 136 135  K  --  3.1* 3.7 3.9 4.9 3.4*  CL  --  96* 102 105 108 101  CO2  --  25 25 21* 20* 24  GLUCOSE  --  120* 118* 89 81 94  BUN  --  11 24* 16 14 10   CREATININE  --  1.24* 1.58* 0.83 0.73 0.75  CALCIUM  --  7.9* 7.6* 8.0* 8.3* 8.4*  MG 1.6*  --   --   --  2.1  --      Recent Results (from the past 240 hour(s))  Blood Culture (routine x 2)     Status: None   Collection Time: 04/17/23  7:42 PM   Specimen: BLOOD  Result Value Ref Range Status   Specimen Description   Final    BLOOD LEFT ANTECUBITAL Performed at [facility], [address] [place], [place] [address]    Special Requests   Final    BOTTLES DRAWN AEROBIC AND ANAEROBIC Blood Culture adequate volume Performed at [facility], [address], [place], [place] [address]    Culture   Final    NO GROWTH 5 DAYS Performed at [facility], [address], [place], [place] [address]    Report Status 04/22/2023 FINAL  Final  Blood Culture (routine x 2)     Status: None   Collection Time: 04/17/23  8:06 PM   Specimen: BLOOD  Result Value Ref Range Status   Specimen Description   Final    BLOOD LEFT ANTECUBITAL Performed at [facility], [address], [place], [place] [address]    Special Requests   Final    BOTTLES DRAWN AEROBIC AND ANAEROBIC Blood Culture adequate volume Performed at [facility], [address], [place], [place] [address]    Culture   Final    NO GROWTH 5 DAYS Performed at [facility], [address] [place], [place] [address]    Report Status 04/22/2023 FINAL  Final  SARS Coronavirus 2 by RT PCR (hospital order, performed in [facility] hospital lab) *cepheid single result test* Anterior Nasal Swab     Status: None   Collection Time: 04/17/23  8:25 PM   Specimen: Anterior Nasal Swab  Result Value Ref Range Status   SARS Coronavirus 2 by RT PCR NEGATIVE NEGATIVE Final  Comment: (NOTE) SARS-CoV-2 target nucleic acids are NOT DETECTED.  The SARS-CoV-2 RNA is generally detectable in upper and lower respiratory specimens during the acute phase of infection. The lowest concentration of SARS-CoV-2 viral copies this assay can detect is 250 copies / mL. A negative result does not preclude SARS-CoV-2 infection and should not be used as the sole basis for treatment or other Jacqueline Mora management decisions.  A negative result may occur with improper specimen collection / handling, submission of specimen other than nasopharyngeal swab, presence of viral mutation(s)  within the areas targeted by this assay, and inadequate number of viral copies (<250 copies / mL). A negative result must be combined with clinical observations, Jacqueline Mora history, and epidemiological information.  Fact Sheet for Patients:   RoadLapTop.co.za  Fact Sheet for Healthcare Providers: http://kim-miller.com/  This test is not yet approved or  cleared by the [place] FDA and has been authorized for detection and/or diagnosis of SARS-CoV-2 by FDA under an Emergency Use Authorization (EUA).  This EUA will remain in effect (meaning this test can be used) for the duration of the COVID-19 declaration under Section 564(b)(1) of the Act, 21 U.S.C. section 360bbb-3(b)(1), unless the authorization is terminated or revoked sooner.  Performed at [id number], [address], [place], [place] [address]   Respiratory (~20 pathogens) panel by PCR     Status: None   Collection Time: 04/18/23 12:06 PM   Specimen: Nasopharyngeal Swab; Respiratory  Result Value Ref Range Status   Adenovirus NOT DETECTED NOT DETECTED Final   Coronavirus 229E NOT DETECTED NOT DETECTED Final    Comment: (NOTE) The Coronavirus on the Respiratory Panel, DOES NOT test for the novel  Coronavirus (2019 nCoV)    Coronavirus HKU1 NOT DETECTED NOT DETECTED Final   Coronavirus NL63 NOT DETECTED NOT DETECTED Final   Coronavirus OC43 NOT DETECTED NOT DETECTED Final   Metapneumovirus NOT DETECTED NOT DETECTED Final   Rhinovirus / Enterovirus NOT DETECTED NOT DETECTED Final   Influenza A NOT DETECTED NOT DETECTED Final   Influenza B NOT DETECTED NOT DETECTED Final   Parainfluenza Virus 1 NOT DETECTED NOT DETECTED Final   Parainfluenza Virus 2 NOT DETECTED NOT DETECTED Final   Parainfluenza Virus 3 NOT DETECTED NOT DETECTED Final   Parainfluenza Virus 4 NOT DETECTED NOT DETECTED Final   Respiratory Syncytial Virus NOT DETECTED NOT DETECTED Final    Bordetella pertussis NOT DETECTED NOT DETECTED Final   Bordetella Parapertussis NOT DETECTED NOT DETECTED Final   Chlamydophila pneumoniae NOT DETECTED NOT DETECTED Final   Mycoplasma pneumoniae NOT DETECTED NOT DETECTED Final    Comment: Performed at [facility], [address] [place], [place] [address]  Aerobic/Anaerobic Culture w Gram Stain (surgical/deep wound)     Status: None (Preliminary result)   Collection Time: 04/18/23  3:14 PM   Specimen: Wound; Serous Fluid  Result Value Ref Range Status   Specimen Description   Final    WOUND ABDOMEN Performed at [facility], [address], [place], [place] [address]    Special Requests   Final    NONE Performed at [facility], [address] [place], [place] [address]    Gram Stain   Final    MODERATE WBC PRESENT, PREDOMINANTLY PMN NO ORGANISMS SEEN Performed at [facility], [address], [place], [place] [address]    Culture   Final    FEW STAPHYLOCOCCUS AUREUS NO ANAEROBES ISOLATED; CULTURE IN PROGRESS FOR  5 DAYS    Report Status PENDING  Incomplete   Organism ID, Bacteria STAPHYLOCOCCUS AUREUS  Final      Susceptibility   Staphylococcus aureus - MIC*    CIPROFLOXACIN <=0.5 SENSITIVE Sensitive     ERYTHROMYCIN <=0.25 SENSITIVE Sensitive     GENTAMICIN <=0.5 SENSITIVE Sensitive     OXACILLIN 0.5 SENSITIVE Sensitive     TETRACYCLINE >=16 RESISTANT Resistant     VANCOMYCIN 1 SENSITIVE Sensitive     TRIMETH/SULFA <=10 SENSITIVE Sensitive     CLINDAMYCIN <=0.25 SENSITIVE Sensitive     RIFAMPIN <=0.5 SENSITIVE Sensitive     Inducible Clindamycin NEGATIVE Sensitive     LINEZOLID 2 SENSITIVE Sensitive     * FEW STAPHYLOCOCCUS AUREUS     Radiology Studies: No results found.  Scheduled Meds:  acetaminophen  1,000 mg Oral Q6H   enoxaparin (LOVENOX) injection  40 mg Subcutaneous Q24H   ezetimibe  10 mg Oral Daily   FLUoxetine  20 mg Oral Daily   [START ON 04/24/2023] furosemide  20  mg Oral Daily   levothyroxine  150 mcg Oral Q0600   losartan  100 mg Oral Daily   ondansetron  4 mg Oral TID AC & HS   pantoprazole  40 mg Oral BID   polyethylene glycol  17 g Oral BID   potassium chloride  40 mEq Oral BID   psyllium  1 packet Oral BID   sodium chloride flush  5 mL Intracatheter Q8H   spironolactone  25 mg Oral Daily   Continuous Infusions:  sodium chloride 10 mL/hr at 04/20/23 2113    ceFAZolin (ANCEF) IV 2 g (04/23/23 1023)     LOS: 5 days   Burnadette Pop, MD [facility] P9/10/2022, 10:44 AM

## 2023-04-25 DIAGNOSIS — F32A Depression, unspecified: Secondary | ICD-10-CM | POA: Diagnosis not present

## 2023-04-25 DIAGNOSIS — K219 Gastro-esophageal reflux disease without esophagitis: Secondary | ICD-10-CM | POA: Diagnosis not present

## 2023-04-25 DIAGNOSIS — Z79899 Other long term (current) drug therapy: Secondary | ICD-10-CM | POA: Diagnosis not present

## 2023-04-25 DIAGNOSIS — R7303 Prediabetes: Secondary | ICD-10-CM | POA: Diagnosis not present

## 2023-04-25 DIAGNOSIS — K432 Incisional hernia without obstruction or gangrene: Secondary | ICD-10-CM | POA: Diagnosis not present

## 2023-04-25 DIAGNOSIS — I069 Rheumatic aortic valve disease, unspecified: Secondary | ICD-10-CM | POA: Diagnosis not present

## 2023-04-25 DIAGNOSIS — Z8616 Personal history of COVID-19: Secondary | ICD-10-CM | POA: Diagnosis not present

## 2023-04-25 DIAGNOSIS — Z85038 Personal history of other malignant neoplasm of large intestine: Secondary | ICD-10-CM | POA: Diagnosis not present

## 2023-04-25 DIAGNOSIS — Z85828 Personal history of other malignant neoplasm of skin: Secondary | ICD-10-CM | POA: Diagnosis not present

## 2023-04-25 DIAGNOSIS — E7800 Pure hypercholesterolemia, unspecified: Secondary | ICD-10-CM | POA: Diagnosis not present

## 2023-04-25 DIAGNOSIS — E559 Vitamin D deficiency, unspecified: Secondary | ICD-10-CM | POA: Diagnosis not present

## 2023-04-25 DIAGNOSIS — K589 Irritable bowel syndrome without diarrhea: Secondary | ICD-10-CM | POA: Diagnosis not present

## 2023-04-25 DIAGNOSIS — I129 Hypertensive chronic kidney disease with stage 1 through stage 4 chronic kidney disease, or unspecified chronic kidney disease: Secondary | ICD-10-CM | POA: Diagnosis not present

## 2023-04-25 DIAGNOSIS — F419 Anxiety disorder, unspecified: Secondary | ICD-10-CM | POA: Diagnosis not present

## 2023-04-25 DIAGNOSIS — Z87891 Personal history of nicotine dependence: Secondary | ICD-10-CM | POA: Diagnosis not present

## 2023-04-25 DIAGNOSIS — E876 Hypokalemia: Secondary | ICD-10-CM | POA: Diagnosis not present

## 2023-04-25 DIAGNOSIS — N182 Chronic kidney disease, stage 2 (mild): Secondary | ICD-10-CM | POA: Diagnosis not present

## 2023-04-25 DIAGNOSIS — Z6841 Body Mass Index (BMI) 40.0 and over, adult: Secondary | ICD-10-CM | POA: Diagnosis not present

## 2023-04-25 DIAGNOSIS — L02211 Cutaneous abscess of abdominal wall: Secondary | ICD-10-CM | POA: Diagnosis not present

## 2023-04-25 DIAGNOSIS — T8149XA Infection following a procedure, other surgical site, initial encounter: Secondary | ICD-10-CM | POA: Diagnosis not present

## 2023-04-25 DIAGNOSIS — E039 Hypothyroidism, unspecified: Secondary | ICD-10-CM | POA: Diagnosis not present

## 2023-04-27 DIAGNOSIS — E876 Hypokalemia: Secondary | ICD-10-CM | POA: Diagnosis not present

## 2023-04-27 DIAGNOSIS — T8149XA Infection following a procedure, other surgical site, initial encounter: Secondary | ICD-10-CM | POA: Diagnosis not present

## 2023-04-27 DIAGNOSIS — E039 Hypothyroidism, unspecified: Secondary | ICD-10-CM | POA: Diagnosis not present

## 2023-04-27 DIAGNOSIS — R7303 Prediabetes: Secondary | ICD-10-CM | POA: Diagnosis not present

## 2023-04-27 DIAGNOSIS — N182 Chronic kidney disease, stage 2 (mild): Secondary | ICD-10-CM | POA: Diagnosis not present

## 2023-04-27 DIAGNOSIS — E559 Vitamin D deficiency, unspecified: Secondary | ICD-10-CM | POA: Diagnosis not present

## 2023-04-27 DIAGNOSIS — K589 Irritable bowel syndrome without diarrhea: Secondary | ICD-10-CM | POA: Diagnosis not present

## 2023-04-27 DIAGNOSIS — I129 Hypertensive chronic kidney disease with stage 1 through stage 4 chronic kidney disease, or unspecified chronic kidney disease: Secondary | ICD-10-CM | POA: Diagnosis not present

## 2023-04-27 DIAGNOSIS — L02211 Cutaneous abscess of abdominal wall: Secondary | ICD-10-CM | POA: Diagnosis not present

## 2023-04-27 DIAGNOSIS — I069 Rheumatic aortic valve disease, unspecified: Secondary | ICD-10-CM | POA: Diagnosis not present

## 2023-04-27 DIAGNOSIS — K432 Incisional hernia without obstruction or gangrene: Secondary | ICD-10-CM | POA: Diagnosis not present

## 2023-04-27 DIAGNOSIS — K219 Gastro-esophageal reflux disease without esophagitis: Secondary | ICD-10-CM | POA: Diagnosis not present

## 2023-04-27 DIAGNOSIS — F32A Depression, unspecified: Secondary | ICD-10-CM | POA: Diagnosis not present

## 2023-04-27 DIAGNOSIS — F419 Anxiety disorder, unspecified: Secondary | ICD-10-CM | POA: Diagnosis not present

## 2023-04-27 DIAGNOSIS — E7800 Pure hypercholesterolemia, unspecified: Secondary | ICD-10-CM | POA: Diagnosis not present

## 2023-04-29 ENCOUNTER — Other Ambulatory Visit: Payer: Self-pay | Admitting: Surgery

## 2023-04-29 DIAGNOSIS — L02211 Cutaneous abscess of abdominal wall: Secondary | ICD-10-CM

## 2023-04-30 DIAGNOSIS — Z4889 Encounter for other specified surgical aftercare: Secondary | ICD-10-CM | POA: Insufficient documentation

## 2023-05-02 DIAGNOSIS — F32A Depression, unspecified: Secondary | ICD-10-CM | POA: Diagnosis not present

## 2023-05-02 DIAGNOSIS — K589 Irritable bowel syndrome without diarrhea: Secondary | ICD-10-CM | POA: Diagnosis not present

## 2023-05-02 DIAGNOSIS — T8149XA Infection following a procedure, other surgical site, initial encounter: Secondary | ICD-10-CM | POA: Diagnosis not present

## 2023-05-02 DIAGNOSIS — F419 Anxiety disorder, unspecified: Secondary | ICD-10-CM | POA: Diagnosis not present

## 2023-05-02 DIAGNOSIS — E039 Hypothyroidism, unspecified: Secondary | ICD-10-CM | POA: Diagnosis not present

## 2023-05-02 DIAGNOSIS — E876 Hypokalemia: Secondary | ICD-10-CM | POA: Diagnosis not present

## 2023-05-02 DIAGNOSIS — K219 Gastro-esophageal reflux disease without esophagitis: Secondary | ICD-10-CM | POA: Diagnosis not present

## 2023-05-02 DIAGNOSIS — L02211 Cutaneous abscess of abdominal wall: Secondary | ICD-10-CM | POA: Diagnosis not present

## 2023-05-02 DIAGNOSIS — E559 Vitamin D deficiency, unspecified: Secondary | ICD-10-CM | POA: Diagnosis not present

## 2023-05-02 DIAGNOSIS — E7800 Pure hypercholesterolemia, unspecified: Secondary | ICD-10-CM | POA: Diagnosis not present

## 2023-05-02 DIAGNOSIS — I069 Rheumatic aortic valve disease, unspecified: Secondary | ICD-10-CM | POA: Diagnosis not present

## 2023-05-02 DIAGNOSIS — K432 Incisional hernia without obstruction or gangrene: Secondary | ICD-10-CM | POA: Diagnosis not present

## 2023-05-02 DIAGNOSIS — R7303 Prediabetes: Secondary | ICD-10-CM | POA: Diagnosis not present

## 2023-05-02 DIAGNOSIS — N182 Chronic kidney disease, stage 2 (mild): Secondary | ICD-10-CM | POA: Diagnosis not present

## 2023-05-02 DIAGNOSIS — I129 Hypertensive chronic kidney disease with stage 1 through stage 4 chronic kidney disease, or unspecified chronic kidney disease: Secondary | ICD-10-CM | POA: Diagnosis not present

## 2023-05-07 ENCOUNTER — Other Ambulatory Visit: Payer: Self-pay | Admitting: Student

## 2023-05-07 DIAGNOSIS — Z8719 Personal history of other diseases of the digestive system: Secondary | ICD-10-CM

## 2023-05-08 ENCOUNTER — Other Ambulatory Visit: Payer: Medicare Other

## 2023-05-08 ENCOUNTER — Inpatient Hospital Stay: Admission: RE | Admit: 2023-05-08 | Payer: Medicare Other | Source: Ambulatory Visit

## 2023-05-09 ENCOUNTER — Ambulatory Visit
Admission: RE | Admit: 2023-05-09 | Discharge: 2023-05-09 | Disposition: A | Discharge status: Home / Self Care | Payer: Medicare Other | Source: Ambulatory Visit | Attending: Student | Admitting: Student

## 2023-05-09 DIAGNOSIS — Z9889 Other specified postprocedural states: Secondary | ICD-10-CM | POA: Diagnosis not present

## 2023-05-09 DIAGNOSIS — Z8719 Personal history of other diseases of the digestive system: Secondary | ICD-10-CM | POA: Diagnosis not present

## 2023-05-09 MED ORDER — IOPAMIDOL (ISOVUE-370) INJECTION 76%
500.0000 mL | Freq: Once | INTRAVENOUS | Status: AC | PRN
  Administered 2023-05-09: 100 mL via INTRAVENOUS

## 2023-05-10 ENCOUNTER — Other Ambulatory Visit: Payer: Self-pay

## 2023-05-10 ENCOUNTER — Encounter (HOSPITAL_COMMUNITY): Payer: Self-pay

## 2023-05-10 ENCOUNTER — Other Ambulatory Visit: Payer: Self-pay | Admitting: Surgery

## 2023-05-10 ENCOUNTER — Inpatient Hospital Stay (HOSPITAL_COMMUNITY)
Admission: AD | Admit: 2023-05-10 | Discharge: 2023-05-13 | DRG: 920 | Disposition: A | Discharge status: Home Health | Payer: Medicare Other | Source: Ambulatory Visit | Attending: Surgery | Admitting: Surgery

## 2023-05-10 DIAGNOSIS — L7634 Postprocedural seroma of skin and subcutaneous tissue following other procedure: Principal | ICD-10-CM | POA: Diagnosis present

## 2023-05-10 DIAGNOSIS — E7800 Pure hypercholesterolemia, unspecified: Secondary | ICD-10-CM | POA: Diagnosis present

## 2023-05-10 DIAGNOSIS — Z6841 Body Mass Index (BMI) 40.0 and over, adult: Secondary | ICD-10-CM | POA: Diagnosis not present

## 2023-05-10 DIAGNOSIS — T8141XA Infection following a procedure, superficial incisional surgical site, initial encounter: Secondary | ICD-10-CM | POA: Diagnosis not present

## 2023-05-10 DIAGNOSIS — Z9049 Acquired absence of other specified parts of digestive tract: Secondary | ICD-10-CM

## 2023-05-10 DIAGNOSIS — I129 Hypertensive chronic kidney disease with stage 1 through stage 4 chronic kidney disease, or unspecified chronic kidney disease: Secondary | ICD-10-CM | POA: Diagnosis not present

## 2023-05-10 DIAGNOSIS — S3011XA Contusion of abdominal wall, initial encounter: Secondary | ICD-10-CM | POA: Diagnosis present

## 2023-05-10 DIAGNOSIS — Z79899 Other long term (current) drug therapy: Secondary | ICD-10-CM

## 2023-05-10 DIAGNOSIS — Z79890 Hormone replacement therapy (postmenopausal): Secondary | ICD-10-CM | POA: Diagnosis not present

## 2023-05-10 DIAGNOSIS — K651 Peritoneal abscess: Secondary | ICD-10-CM | POA: Diagnosis not present

## 2023-05-10 DIAGNOSIS — K432 Incisional hernia without obstruction or gangrene: Secondary | ICD-10-CM

## 2023-05-10 DIAGNOSIS — Z87891 Personal history of nicotine dependence: Secondary | ICD-10-CM

## 2023-05-10 DIAGNOSIS — K219 Gastro-esophageal reflux disease without esophagitis: Secondary | ICD-10-CM | POA: Diagnosis present

## 2023-05-10 DIAGNOSIS — L02211 Cutaneous abscess of abdominal wall: Secondary | ICD-10-CM

## 2023-05-10 DIAGNOSIS — N182 Chronic kidney disease, stage 2 (mild): Secondary | ICD-10-CM | POA: Diagnosis not present

## 2023-05-10 DIAGNOSIS — Z90710 Acquired absence of both cervix and uterus: Secondary | ICD-10-CM

## 2023-05-10 DIAGNOSIS — Z8616 Personal history of COVID-19: Secondary | ICD-10-CM

## 2023-05-10 DIAGNOSIS — E039 Hypothyroidism, unspecified: Secondary | ICD-10-CM | POA: Diagnosis not present

## 2023-05-10 DIAGNOSIS — Z86008 Personal history of in-situ neoplasm of other site: Secondary | ICD-10-CM | POA: Diagnosis not present

## 2023-05-10 DIAGNOSIS — F32A Depression, unspecified: Secondary | ICD-10-CM | POA: Diagnosis present

## 2023-05-10 DIAGNOSIS — S301XXA Contusion of abdominal wall, initial encounter: Principal | ICD-10-CM | POA: Diagnosis present

## 2023-05-10 DIAGNOSIS — F419 Anxiety disorder, unspecified: Secondary | ICD-10-CM | POA: Diagnosis present

## 2023-05-10 DIAGNOSIS — Z85038 Personal history of other malignant neoplasm of large intestine: Secondary | ICD-10-CM | POA: Diagnosis not present

## 2023-05-10 LAB — CBC
HCT: 35.7 % — ABNORMAL LOW (ref 36.0–46.0)
Hemoglobin: 11.2 g/dL — ABNORMAL LOW (ref 12.0–15.0)
MCH: 29.2 pg (ref 26.0–34.0)
MCHC: 31.4 g/dL (ref 30.0–36.0)
MCV: 93.2 fL (ref 80.0–100.0)
Platelets: 279 10*3/uL (ref 150–400)
RBC: 3.83 MIL/uL — ABNORMAL LOW (ref 3.87–5.11)
RDW: 15 % (ref 11.5–15.5)
WBC: 7.2 10*3/uL (ref 4.0–10.5)
nRBC: 0 % (ref 0.0–0.2)

## 2023-05-10 LAB — CREATININE, SERUM
Creatinine, Ser: 0.87 mg/dL (ref 0.44–1.00)
GFR, Estimated: >60 mL/min (ref >60)

## 2023-05-10 LAB — PROTIME-INR
INR: 1 (ref 0.8–1.2)
Prothrombin Time: 13.6 seconds (ref 11.4–15.2)

## 2023-05-10 MED ORDER — SODIUM CHLORIDE 0.9 % IV SOLN
2.0000 g | Freq: Three times a day (TID) | INTRAVENOUS | Status: DC
  Administered 2023-05-10 – 2023-05-13 (×9): 2 g via INTRAVENOUS
  Filled 2023-05-10 (×10): qty 12.5

## 2023-05-10 MED ORDER — DIPHENHYDRAMINE HCL 12.5 MG/5ML PO ELIX: 12.5000 mg | ORAL_SOLUTION | Freq: Four times a day (QID) | ORAL | Status: DC | PRN

## 2023-05-10 MED ORDER — ONDANSETRON 4 MG PO TBDP: 4.0000 mg | ORAL_TABLET | Freq: Four times a day (QID) | ORAL | Status: DC | PRN

## 2023-05-10 MED ORDER — METOPROLOL TARTRATE 5 MG/5ML IV SOLN: 5.0000 mg | Freq: Four times a day (QID) | INTRAVENOUS | Status: DC | PRN

## 2023-05-10 MED ORDER — LACTATED RINGERS IV SOLN: INTRAVENOUS | Status: AC

## 2023-05-10 MED ORDER — MAGIC MOUTHWASH: 15.0000 mL | Freq: Four times a day (QID) | ORAL | Status: DC | PRN

## 2023-05-10 MED ORDER — VANCOMYCIN HCL 2000 MG/400ML IV SOLN
2000.0000 mg | Freq: Once | INTRAVENOUS | Status: AC
  Administered 2023-05-10: 2000 mg via INTRAVENOUS
  Filled 2023-05-10: qty 400

## 2023-05-10 MED ORDER — BISACODYL 10 MG RE SUPP: 10.0000 mg | Freq: Two times a day (BID) | RECTAL | Status: DC | PRN

## 2023-05-10 MED ORDER — HYDROMORPHONE HCL 1 MG/ML IJ SOLN
0.5000 mg | INTRAMUSCULAR | Status: DC | PRN
  Administered 2023-05-10 – 2023-05-13 (×7): 1 mg via INTRAVENOUS
  Filled 2023-05-10 (×7): qty 1

## 2023-05-10 MED ORDER — VANCOMYCIN HCL 1750 MG/350ML IV SOLN: 1750.0000 mg | INTRAVENOUS | Status: DC

## 2023-05-10 MED ORDER — PROCHLORPERAZINE MALEATE 10 MG PO TABS: 10.0000 mg | ORAL_TABLET | Freq: Four times a day (QID) | ORAL | Status: DC | PRN

## 2023-05-10 MED ORDER — POLYETHYLENE GLYCOL 3350 17 G PO PACK
17.0000 g | PACK | Freq: Two times a day (BID) | ORAL | Status: DC
  Administered 2023-05-10 – 2023-05-11 (×2): 17 g via ORAL
  Filled 2023-05-10 (×5): qty 1

## 2023-05-10 MED ORDER — SIMETHICONE 40 MG/0.6ML PO SUSP: 80.0000 mg | Freq: Four times a day (QID) | ORAL | Status: DC | PRN

## 2023-05-10 MED ORDER — ONDANSETRON HCL 4 MG/2ML IJ SOLN
4.0000 mg | Freq: Four times a day (QID) | INTRAMUSCULAR | Status: DC | PRN
  Administered 2023-05-11 – 2023-05-12 (×4): 4 mg via INTRAVENOUS
  Filled 2023-05-10 (×4): qty 2

## 2023-05-10 MED ORDER — ACETAMINOPHEN 650 MG RE SUPP: 650.0000 mg | Freq: Four times a day (QID) | RECTAL | Status: DC | PRN

## 2023-05-10 MED ORDER — SIMETHICONE 80 MG PO CHEW: 40.0000 mg | CHEWABLE_TABLET | Freq: Four times a day (QID) | ORAL | Status: DC | PRN

## 2023-05-10 MED ORDER — METHOCARBAMOL 500 MG PO TABS
1000.0000 mg | ORAL_TABLET | Freq: Four times a day (QID) | ORAL | Status: DC | PRN
  Filled 2023-05-10: qty 2

## 2023-05-10 MED ORDER — PROCHLORPERAZINE EDISYLATE 10 MG/2ML IJ SOLN: 5.0000 mg | Freq: Four times a day (QID) | INTRAMUSCULAR | Status: DC | PRN

## 2023-05-10 MED ORDER — ALUM & MAG HYDROXIDE-SIMETH 200-200-20 MG/5ML PO SUSP: 30.0000 mL | Freq: Four times a day (QID) | ORAL | Status: DC | PRN

## 2023-05-10 MED ORDER — PHENOL 1.4 % MT LIQD: 2.0000 | OROMUCOSAL | Status: DC | PRN

## 2023-05-10 MED ORDER — SALINE SPRAY 0.65 % NA SOLN: 1.0000 | Freq: Four times a day (QID) | NASAL | Status: DC | PRN

## 2023-05-10 MED ORDER — MENTHOL 3 MG MT LOZG: 1.0000 | LOZENGE | OROMUCOSAL | Status: DC | PRN

## 2023-05-10 MED ORDER — ACETAMINOPHEN 325 MG PO TABS
650.0000 mg | ORAL_TABLET | Freq: Four times a day (QID) | ORAL | Status: DC | PRN
  Administered 2023-05-10: 650 mg via ORAL
  Filled 2023-05-10 (×2): qty 2

## 2023-05-10 MED ORDER — NAPHAZOLINE-GLYCERIN 0.012-0.25 % OP SOLN: 1.0000 [drp] | Freq: Four times a day (QID) | OPHTHALMIC | Status: DC | PRN

## 2023-05-10 MED ORDER — LACTATED RINGERS IV BOLUS: 1000.0000 mL | Freq: Three times a day (TID) | INTRAVENOUS | Status: DC | PRN

## 2023-05-10 MED ORDER — DIPHENHYDRAMINE HCL 50 MG/ML IJ SOLN
12.5000 mg | Freq: Four times a day (QID) | INTRAMUSCULAR | Status: DC | PRN
  Administered 2023-05-10: 12.5 mg via INTRAVENOUS
  Filled 2023-05-10: qty 1

## 2023-05-10 MED ORDER — METHOCARBAMOL 1000 MG/10ML IJ SOLN: 1000.0000 mg | Freq: Four times a day (QID) | INTRAVENOUS | Status: DC | PRN

## 2023-05-10 NOTE — Progress Notes (Signed)
05/10/2023     PROVIDER: Reine Just, PA  MRN: [id number] DOB: [birth year] DATE OF ENCOUNTER: 05/08/2023 Interval History:  Jacqueline Mora is a 67 y.o. female who underwent laparoscopic lysis of adhesions and repair of recurrent incisional hernia with panniculectomy on 04/04/2023 by Dr. Michaell Cowing. Previously, she was found to have seromas on CT scan. Drained under ultrasound 04/18/2023. Culture grew MSSA. She was switched to oral Duricef. She was seen by Dr. Michaell Cowing on 04/30/2023 at which time she was doing well so drains were removed. She took her last dose of Duricef today. She called the office today, 05/07/2023, with concerns of pain, swelling, and firmness to the right abdomen with purulent drainage from the incision x 2 days. She states her abdomen feels bloated. She complains of decreased appetite but is trying to eat since she is taking antibiotics. She states she is drinking plenty of fluids. She denies constipation/diarrhea. She continues to hold losartan due to low blood pressure. She started taking oxycodone again due to pain.  Review of Systems:  ROS All other systems reviewed and are negative.  Medications:  Current Outpatient Medications on File Prior to Visit Medication Sig Dispense Refill ALPRAZolam (XANAX) 0.5 MG tablet TAKE ONE-HALF TO ONE TABLET BY MOUTH ONCE DAILY. GENERIC EQUIVALENT FOR XANAX. cholecalciferol (VITAMIN D3) 1000 unit tablet Take 2,000 Units by mouth estradioL (ESTRACE) 2 MG tablet Take 2 mg by mouth fluticasone propionate (FLONASE) 50 mcg/actuation nasal spray USE ONE SPRAY IN EACH NOSTRIL ONCE A DAY FUROsemide (LASIX) 40 MG tablet Take 40 mg by mouth once daily hydroCHLOROthiazide (HYDRODIURIL) 25 MG tablet Take 12.5 mg by mouth ibuprofen (MOTRIN) 800 MG tablet Take 800 mg by mouth every 6 (six) hours as needed ketoconazole (NIZORAL) 2 % cream APPLY TOPICALLY DAILY AS NEEDED FOR FLARES levothyroxine (SYNTHROID) 200 MCG tablet Take by mouth losartan (COZAAR)  100 MG tablet Take 100 mg by mouth once daily meloxicam (MOBIC) 15 MG tablet Take 1 tablet by mouth once daily oxyCODONE (ROXICODONE) 5 MG immediate release tablet Take 1 tablet (5 mg total) by mouth every 6 (six) hours as needed for Pain 20 tablet 0  No current facility-administered medications on file prior to visit.  Physical Examination:  BP (!) 136/93  Pulse 96  Temp 36.2 C (97.1 F)  SpO2 99% General: Well-developed, well-nourished, in no acute distress. Abdomen: Panniculectomy incision has a 3 cm superficial opening in the central portion of the incision on the mid lower abdomen. There is no deeper opening or active drainage but there is some yellow drainage on the gauze that was removed. There is no overlying erythema but she is quite tender to palpation on the right lower abdomen along the incision. There is a 2 cm x 1 cm area of faint erythema on the left lateral incision. No fluctuance or drainage  Assessment and Plan:  Diagnoses and all orders for this visit:  S/P hernia repair - CT abdomen pelvis with contrast; Future  Other orders - oxyCODONE (ROXICODONE) 5 MG immediate release tablet; Take 1 tablet (5 mg total) by mouth every 4 (four) hours as needed for Pain - cefadroxil (DURICEF) 500 mg/5 mL suspension; Take 10 mLs (1,000 mg total) by mouth 2 (two) times daily for 10 days   Jacqueline Mora is status post laparoscopic lysis of adhesions and repair of recurrent incisional hernia with panniculectomy on 04/04/2023 by Dr. Michaell Cowing. She developed postop seromas and drains were removed last week. On exam today, she is quite tender on the right  lower abdomen along the panniculectomy incision. There is no obvious external signs of infection. Given her level of discomfort, we ordered CT abdomen pelvis with IV and oral contrast. If CT shows fluid collections, she may need drains placed again. She was advised to go to the ER if she develops any worsening issues before outpatient workup can  be completed. She provided understanding.  No follow-ups on file.  Saunders Glance, [facility]   ADDENDUM:   CT scan notes recurrent fluid collections.  Given her worsening discomfort we will admit and placed on IV antibiotics and repeat drains to rule out recurrent infected seromas or infected mesh.  My instinct is she will need a PICC line and 6 weeks of IV antibiotics depending on culture findings.  Hopefully try and salvage and prevent the need for mesh removal.  We will see.

## 2023-05-10 NOTE — Progress Notes (Signed)
Patient on list for PICC placement, clarification needed prior to placement regarding cultures. Per MD note PICC may be needed "pending culture findings," but no cultures ordered at this time. Have been unable to connect with MD about this, PICC team will reach out again tomorrow.

## 2023-05-10 NOTE — Progress Notes (Signed)
On my assessment patient was complaining of her scalp being itchy, she states it started after the second antibiotic was given (Vancomycin) and her face was flushed. She states she's had a similar reaction before with an antibiotic but she is unsure which one. Vancomycin was stopped only were remaining and PRN IV Benadryl was given, IV tubing and maintenance fluids replaced.

## 2023-05-10 NOTE — Progress Notes (Signed)
Pharmacy Antibiotic Note  Jacqueline Mora is a 67 y.o. female admitted on 05/10/2023 with  Infected seroma, cellulitis .  Pharmacy has been consulted for Vanco, Cefepime dosing.   ID: Infected seromas. Tender on the right lower abdomen along the panniculectomy incision. Faint erythema.  8/29: Wound cx: MSSA  Vanco 9/20>> Cefepime 9/20>>  Plan: Cefepime 2g IV q8hr Vanco 2g IV x 1 load Vancomycin 1750 mg IV Q 24 hrs. Goal AUC 400-550. Expected AUC: 519 SCr used: 0.8    Height: 5\' 5"  (165.1 cm) Weight: 121.8 kg (268 lb 8.3 oz) IBW/kg (Calculated) : 57  Temp (24hrs), Avg:98.2 F (36.8 C), Min:98.2 F (36.8 C), Max:98.2 F (36.8 C)  Recent Labs  Lab 05/10/23 1450  WBC 7.2  CREATININE 0.87    Estimated Creatinine Clearance: 82.1 mL/min (by C-G formula based on SCr of 0.87 mg/dL).    Allergies  Allergen Reactions   Amlodipine Besylate Other (See Comments)    Dizziness   Eszopiclone     Other reaction(s): Crazy dreams   Tape     Adhesive on medical tape and glue burns skin - requires paper tape.   Zocor [Simvastatin]     myalgias   Clindamycin Hcl Rash   Meloxicam Rash   Methylprednisolone Rash   Penicillin G Rash    Tolerates cefalosporins   Artie Mcintyre S. Merilynn Finland, PharmD, BCPS Clinical Staff Pharmacist [email or web address]  Pasty Spillers 05/10/2023 3:55 PM

## 2023-05-11 ENCOUNTER — Encounter (HOSPITAL_COMMUNITY): Payer: Self-pay

## 2023-05-11 MED ORDER — VANCOMYCIN HCL 1500 MG/300ML IV SOLN
1500.0000 mg | INTRAVENOUS | Status: DC
  Administered 2023-05-12: 1500 mg via INTRAVENOUS
  Filled 2023-05-11 (×3): qty 300

## 2023-05-11 NOTE — Progress Notes (Signed)
PHARMACY NOTE:  ANTIMICROBIAL RENAL DOSAGE ADJUSTMENT  Current antimicrobial regimen includes a mismatch between antimicrobial dosage and estimated renal function.  As per policy approved by the Pharmacy & Therapeutics and Medical Executive Committees, the antimicrobial dosage will be adjusted accordingly.  Current antimicrobial dosage: vancomycin 1750 mg IV q24h  Indication: cellulitis  Renal Function:  Estimated Creatinine Clearance: 82.1 mL/min (by C-G formula based on SCr of 0.87 mg/dL).     Antimicrobial dosage has been changed to:  vancomycin 1500 mg IV q24h   Thank you for allowing pharmacy to be a part of this patient's care.   Pricilla Riffle, PharmD, BCPS Clinical Pharmacist 05/11/2023 9:04 AM

## 2023-05-11 NOTE — Progress Notes (Signed)
Subjective/Chief Complaint: Patient readmitted by Dr. Michaell Cowing yesterday.  She has persistent seromas on CT scanning.  She feels well today.  She was having some drainage from her panniculectomy incision.  CT scan reviewed which shows subcutaneous fluid collections in the subcu space from her panniculectomy.  Her white count is normal.  She has no fever or chills.  She states no one is discussed with her the plans.  I discussed the plans with her today.   Objective: Vital signs in last 24 hours: Temp:  [98 F (36.7 C)-98.6 F (37 C)] 98.4 F (36.9 C) (09/21 0956) Pulse Rate:  [65-93] 68 (09/21 0956) Resp:  [17-18] 18 (09/21 0956) BP: (132-148)/(72-82) 137/74 (09/21 0956) SpO2:  [96 %-99 %] 97 % (09/21 0956) Weight:  [121.8 kg] 121.8 kg (09/20 1353) Last BM Date : 05/11/23  Intake/Output from previous day: 09/20 0701 - 09/21 0700 In: 884.5 [P.O.:240; I.V.:401.2; IV Piggyback:243.3] Out: 1350 [Urine:1350] Intake/Output this shift: Total I/O In: 240 [P.O.:240] Out: 2 [Urine:1; Stool:1]  Resp: clear to auscultation bilaterally Cardio: nsr Incision/Wound: Abdomen soft nontender.  Lower panniculectomy incisions intact with small open area.  Minimal signs of cellulitis.  No fluctuance.  More tender on the left than the right.  Lab Results:  Recent Labs    05/10/23 1450  WBC 7.2  HGB 11.2*  HCT 35.7*  PLT 279   BMET Recent Labs    05/10/23 1450  CREATININE 0.87   PT/INR Recent Labs    05/10/23 1450  LABPROT 13.6  INR 1.0   ABG No results for input(s): "PHART", "HCO3" in the last 72 hours.  Invalid input(s): "PCO2", "PO2"  Studies/Results: [place] EKG SITE RITE  Result Date: 05/10/2023 If Site Rite image not attached, placement could not be confirmed due to current cardiac rhythm.  CT ABDOMEN PELVIS W CONTRAST  Result Date: 05/09/2023 CLINICAL DATA:  Abdominal pain, Nausea, and bloating. Tenderness to palpation 1 month postop from ventral hernia repair. EXAM: CT  ABDOMEN AND PELVIS WITH CONTRAST TECHNIQUE: Multidetector CT imaging of the abdomen and pelvis was performed using the standard protocol following bolus administration of intravenous contrast. RADIATION DOSE REDUCTION: This exam was performed according to the departmental dose-optimization program which includes automated exposure control, adjustment of the mA and/or kV according to patient size and/or use of iterative reconstruction technique. CONTRAST:  ISOVUE-370 IOPAMIDOL (ISOVUE-370) INJECTION 76% COMPARISON:  04/17/2023 FINDINGS: Lower Chest: No acute findings. Hepatobiliary: No suspicious hepatic masses identified. Gallbladder is unremarkable. No evidence of biliary ductal dilatation. Pancreas:  No mass or inflammatory changes. Spleen: Within normal limits in size and appearance. Adrenals/Urinary Tract: No suspicious masses identified. No evidence of ureteral calculi or hydronephrosis. Stomach/Bowel: No evidence of obstruction, inflammatory process or abnormal fluid collections. Normal appendix visualized. Vascular/Lymphatic: No pathologically enlarged lymph nodes. No acute vascular findings. Reproductive: Prior hysterectomy noted. Adnexal regions are unremarkable in appearance. Other: Prior ventral abdominal wall hernia repair and panniculectomy. A small midline epigastric ventral hernia is again seen, which contains only fat. Large rim enhancing fluid collection is seen throughout the anterior abdominal wall subcutaneous tissues. This currently measures 36.3 x 4.7 cm, mildly decreased in size since previous study. Musculoskeletal:  No suspicious bone lesions identified. IMPRESSION: Large rim enhancing fluid collection throughout the anterior abdominal wall subcutaneous tissues bilaterally. This has mildly decreased in size since previous study. Differential diagnosis includes postoperative seroma, lymphocele, and abscess. Stable small epigastric ventral hernia, which contains only fat. Electronically  Signed   By: Mayra Neer  Eppie Gibson M.D.   On: 05/09/2023 16:42    Anti-infectives: Anti-infectives (From admission, onward)    Start     Dose/Rate Route Frequency Ordered Stop   05/11/23 1600  vancomycin (VANCOREADY) IVPB 1750 mg/350 mL  Status:  Discontinued        1,750 mg 175 mL/hr over 120 Minutes Intravenous Every 24 hours 05/10/23 1533 05/11/23 0835   05/11/23 1600  vancomycin (VANCOREADY) IVPB 1500 mg/300 mL        1,500 mg 150 mL/hr over 120 Minutes Intravenous Every 24 hours 05/11/23 0835 05/18/23 1559   05/10/23 1700  ceFEPIme (MAXIPIME) 2 g in sodium chloride 0.9 % 100 mL IVPB        2 g 200 mL/hr over 30 Minutes Intravenous Every 8 hours 05/10/23 1554     05/10/23 1530  vancomycin (VANCOREADY) IVPB 2000 mg/400 mL        2,000 mg 200 mL/hr over 120 Minutes Intravenous  Once 05/10/23 1441 05/10/23 1947       Assessment/Plan: Patient Active Problem List   Diagnosis Date Noted   Abdominal wall seroma 05/10/2023   CKD (chronic kidney disease) stage 2, GFR 60-89 ml/min 04/19/2023   Hypokalemia 04/18/2023   Nausea & vomiting 04/18/2023   Abscess of abdominal wall 04/18/2023   Fever postop 04/17/2023   Acute blood loss anemia (ABLA) 04/09/2023   Hypothyroidism    Prediabetes    IBS (irritable bowel syndrome)    GERD (gastroesophageal reflux disease)    Anxiety    Recurrent incisional hernia 04/04/2023   Elevated coronary artery calcium score 02/20/2023   Personal history of colon cancer 01/28/2023   DOE (dyspnea on exertion) 12/08/2020   Morbid obesity (HCC) 12/08/2020   Essential hypertension 12/08/2020   Hyperlipidemia 12/08/2020   Chest pain of uncertain etiology 12/08/2020   Muscle weakness (generalized) 03/20/2020   Malignant melanoma (HCC) 12/07/2019   Migraine 12/04/2019   Genital herpes simplex 12/04/2019   Constipation 09/02/2018   Dizziness 01/29/2018   Post-concussion syndrome 01/29/2018   Intractable post-traumatic headache 06/19/2017   Adjustment insomnia  06/19/2017   Sensitive skin 08/20/1998      Recurrent seroma   Request IR consider for percutaneous drainage  Dr. Michaell Cowing is ordered PICC line.  She may need home antibiotics.  Allow clears for now until timing of drainage complete.  If no plan for drainage today, I have discussed this with the nurse and her diet can be advanced  White count normal no signs of sepsis  Plan discussed with patient today.   LOS: 1 day    Dortha Schwalbe MD  05/11/2023

## 2023-05-11 NOTE — Consult Note (Signed)
Chief Complaint: Patient was seen in consultation today for abdominal wall fluid collection at the request of Cornett, Thomas   Referring Physician(s): Cornett, Maisie Fus   Supervising Physician: Marliss Coots  Patient Status: [facility] - In-pt  History of Present Illness: Jacqueline Mora is a 67 y.o. 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 Lowella Dandy on 04/18/23, s/p drain removal by general surgery on 9/10, presents with abdominal pain and abdominal wall fluid collection, IR was consulted for drain placement.   Patient is known to IR for previous RLQ/LLQ drain placement on 04/18/23, performed by Dr. Lowella Dandy. Culture showed S aureus. Patient was discharged in stable condition on 04/23/23 and had a follow up visit with Dr. Michaell Cowing on 9/10 when drains were removed. Patient called general surgery office on 9/17 due to development of pain, swelling, and firmness to the right abdomen with purulent drainage from the incision x 2 days, CT A/P with on 9/19 showed:   Large rim enhancing fluid collection throughout the anterior abdominal wall subcutaneous tissues bilaterally. This has mildly decreased in size since previous study. Differential diagnosis includes postoperative seroma, lymphocele, and abscess.   Stable small epigastric ventral hernia, which contains only fat.  Patient was admitted to the hospital under the care of general surgery, IR was requested for image guided drain placement.  Case reviewed and approved by Dr. Elby Showers.   Patient laying in bed, not in acute distress. Family members at bedside.  Reports generalized lower abdominal pain.  Denise headache, fever, chills, shortness of breath,  cough, chest pain, nausea ,vomiting, and bleeding.    Past Medical History:  Diagnosis Date   Abdominal hernia    Anemia    Anxiety    BMI 45.0-49.9, adult (HCC)    Cancer (HCC)    Colon cancer (HCC)    COVID-19    Depression    GERD (gastroesophageal reflux disease)    History of colon polyps    Hypercholesteremia    Hypertension    occ   Hypothyroidism    IBS (irritable bowel syndrome)    Mitral valve problem    Petechiae 04/04/2023   red patches after ankle   PONV (postoperative nausea and vomiting)    Prediabetes    Squamous cell carcinoma in situ (SCCIS) of skin    Thyroid disease    Vitamin D deficiency     Past Surgical History:  Procedure Laterality Date   ABDOMINAL HYSTERECTOMY     BLADDER NECK SUSPENSION     CESAREAN SECTION     x 2   colon cancer     COLONOSCOPY     HERNIA REPAIR     Mesh placed then later removed due to infection.   laparostomy     VENTRAL HERNIA REPAIR N/A 04/04/2023   Procedure: LAPAROSCOPIC VENTRAL WALL HERNIA WITH LYSIS OF ADHESIONS AND PANNICULECTOMY;  Surgeon: Karie Soda, MD;  Location: [facility];  Service: General;  Laterality: N/A;    Allergies: Amlodipine besylate, Eszopiclone, Tape, Zocor [simvastatin], Clindamycin hcl, Meloxicam, Methylprednisolone, and Penicillin g  Medications: Prior to Admission medications   Medication Sig Start Date End Date Taking? Authorizing Provider  ALPRAZolam Prudy Feeler) 0.5 MG tablet Take 0.5 mg by mouth as needed for anxiety.   Yes [provider]  cefadroxil (DURICEF) 500 MG/5ML suspension Take 10 mLs by mouth every 12 (twelve) hours.   Yes [provider]  cholecalciferol (VITAMIN D) 1000 units tablet Take 1,000 Units by mouth 4 (four) times a week.   Yes [provider]  Cyanocobalamin (B-12 PO) Take 1 tablet by mouth 4 (four) times a week.   Yes [provider]  estradiol (ESTRACE) 1 MG tablet Take 1 mg by mouth daily.   Yes [provider]   ezetimibe (ZETIA) 10 MG tablet Take 10 mg by mouth daily. 02/25/23  Yes [provider]  FLUoxetine (PROZAC) 10 MG capsule Take 20 mg by mouth daily.   Yes [provider]  fluticasone (FLONASE) 50 MCG/ACT nasal spray Place 1 spray into both nostrils daily as needed for allergies. 12/12/16  Yes [provider]  furosemide (LASIX) 40 MG tablet TAKE 1 TABLET(40 MG TOTAL) BY MOUTH DAILY Patient taking differently: Take 40 mg by mouth daily. 07/09/22  Yes Chandrasekhar, Mahesh A, MD  ketoconazole (NIZORAL) 2 % cream Apply 1 Application topically daily. 08/22/21  Yes [provider]  levothyroxine (SYNTHROID) 150 MCG tablet Take 150 mcg by mouth daily before breakfast.   Yes [provider]  losartan (COZAAR) 100 MG tablet Take 100 mg by mouth daily. Holding per Dr. 05/10/2023   Yes [provider]  ondansetron (ZOFRAN-ODT) 4 MG disintegrating tablet Take 1 tablet (4 mg total) by mouth every 6 (six) hours as needed for nausea. 04/23/23  Yes Karie Soda, MD  oxyCODONE 10 MG TABS Take 1-1.5 tablets (10-15 mg total) by mouth every 6 (six) hours as needed for severe pain or breakthrough pain. 04/09/23  Yes Karie Soda, MD  RABEprazole (ACIPHEX) 20 MG tablet Take 20 mg by mouth daily.   Yes [provider]  spironolactone (ALDACTONE) 25 MG tablet TAKE ONE TABLET BY MOUTH DAILY DOSE INCREASE Patient taking differently: Take 25 mg by mouth once. 10/08/22  Yes Chandrasekhar, Mahesh A, MD  losartan (COZAAR) 100 MG tablet Take 100 mg by mouth daily.    [provider]     Family History  Problem Relation Age of Onset   Kidney cancer Mother    Diabetes Father     Social History   Socioeconomic History   Marital status: Widowed    Spouse name: Not on file   Number of children: 2   Years of education: 4 yrs college   Highest education level: Not on file  Occupational History   Occupation: Retired  Tobacco Use   Smoking status: Former     Types: Cigarettes   Smokeless tobacco: Never   Tobacco comments:    Quit 1984  Vaping Use   Vaping status: Never Used  Substance and Sexual Activity   Alcohol use: Not Currently    Comment: occasionally   Drug use: No   Sexual activity: Not on file  Other Topics Concern   Not on file  Social History Narrative   Lives at home with husband.   Right-handed.   4-5 cups unsweet tea per week.   Social Determinants of Health   Financial Resource Strain: Not on file  Food Insecurity: Patient Declined (05/10/2023)   Hunger Vital Sign    Worried About Running Out of Food in the Last Year: Patient declined    Ran Out of Food in the Last Year: Patient declined  Transportation Needs: Patient Declined (05/10/2023)   PRAPARE - Administrator, Civil Service (Medical):  Patient declined    Lack of Transportation (Non-Medical): Patient declined  Physical Activity: Not on file  Stress: Not on file  Social Connections: Not on file     Review of Systems: A 12 point ROS discussed and pertinent positives are indicated in the HPI above.  All other systems are negative.  Vital Signs: BP (!) 151/75 (BP Location: Left Arm)   Pulse 65   Temp 98.5 F (36.9 C) (Oral)   Resp 18   Ht 5\' 5"  (1.651 m)   Wt 268 lb 8.3 oz (121.8 kg)   SpO2 98%   BMI 44.68 kg/m    Physical Exam Vitals and nursing note reviewed.  Constitutional:      General: Patient is not in acute distress.    Appearance: Normal appearance. Patient is not ill-appearing.  HENT:     Head: Normocephalic and atraumatic.     Mouth/Throat:     Mouth: Mucous membranes are moist.     Pharynx: Oropharynx is clear.  Cardiovascular:     Rate and Rhythm: Normal rate and regular rhythm.     Pulses: Normal pulses.     Heart sounds: Normal heart sounds.  Pulmonary:     Effort: Pulmonary effort is normal.     Breath sounds: Normal breath sounds.  Abdominal:     General: Abdomen is flat. Bowel sounds are normal.      Palpations: Abdomen is soft.  Musculoskeletal:     Cervical back: Neck supple.  Skin:    General: Skin is warm and dry.     Coloration: Skin is not jaundiced or pale.  Neurological:     Mental Status: Patient is alert and oriented to person, place, and time.  Psychiatric:        Mood and Affect: Mood normal.        Behavior: Behavior normal.        Judgment: Judgment normal.    MD Evaluation Airway: WNL Heart: WNL Abdomen: WNL Chest/ Lungs: WNL ASA  Classification: 2 Mallampati/Airway Score: Two  Imaging: [place] EKG SITE RITE  Result Date: 05/10/2023 If Site Rite image not attached, placement could not be confirmed due to current cardiac rhythm.  CT ABDOMEN PELVIS W CONTRAST  Result Date: 05/09/2023 CLINICAL DATA:  Abdominal pain, Nausea, and bloating. Tenderness to palpation 1 month postop from ventral hernia repair. EXAM: CT ABDOMEN AND PELVIS WITH CONTRAST TECHNIQUE: Multidetector CT imaging of the abdomen and pelvis was performed using the standard protocol following bolus administration of intravenous contrast. RADIATION DOSE REDUCTION: This exam was performed according to the departmental dose-optimization program which includes automated exposure control, adjustment of the mA and/or kV according to patient size and/or use of iterative reconstruction technique. CONTRAST:  ISOVUE-370 IOPAMIDOL (ISOVUE-370) INJECTION 76% COMPARISON:  04/17/2023 FINDINGS: Lower Chest: No acute findings. Hepatobiliary: No suspicious hepatic masses identified. Gallbladder is unremarkable. No evidence of biliary ductal dilatation. Pancreas:  No mass or inflammatory changes. Spleen: Within normal limits in size and appearance. Adrenals/Urinary Tract: No suspicious masses identified. No evidence of ureteral calculi or hydronephrosis. Stomach/Bowel: No evidence of obstruction, inflammatory process or abnormal fluid collections. Normal appendix visualized. Vascular/Lymphatic: No pathologically enlarged  lymph nodes. No acute vascular findings. Reproductive: Prior hysterectomy noted. Adnexal regions are unremarkable in appearance. Other: Prior ventral abdominal wall hernia repair and panniculectomy. A small midline epigastric ventral hernia is again seen, which contains only fat. Large rim enhancing fluid collection is seen throughout the anterior abdominal wall subcutaneous tissues. This currently measures  36.3 x 4.7 cm, mildly decreased in size since previous study. Musculoskeletal:  No suspicious bone lesions identified. IMPRESSION: Large rim enhancing fluid collection throughout the anterior abdominal wall subcutaneous tissues bilaterally. This has mildly decreased in size since previous study. Differential diagnosis includes postoperative seroma, lymphocele, and abscess. Stable small epigastric ventral hernia, which contains only fat. Electronically Signed   By: Danae Orleans M.D.   On: 05/09/2023 16:42   VAS [place] LOWER EXTREMITY VENOUS (DVT)  Result Date: 04/19/2023  Lower Venous DVT Study Patient Name:  ROSILAND YOUNGSTROM  Date of Exam:   04/19/2023 Medical Rec #: [id number]        Accession #:    [id number] Date of Birth: [birth year]       Patient Gender: F Patient Age:   45 years Exam Location:  [facility] Procedure:      VAS [place] LOWER EXTREMITY VENOUS (DVT) Referring Phys: M[facility] --------------------------------------------------------------------------------  Indications: Edema.  Risk Factors: None identified Surgery. Limitations: Poor ultrasound/tissue interface. Comparison Study: No prior studies. Performing Technologist: Chanda Busing RVT  Examination Guidelines: A complete evaluation includes B-mode imaging, spectral Doppler, color Doppler, and power Doppler as needed of all accessible portions of each vessel. Bilateral testing is considered an integral part of a complete examination. Limited examinations for reoccurring indications may be performed as noted. The reflux portion of the  exam is performed with the patient in reverse Trendelenburg.  +---------+---------------+---------+-----------+----------+--------------+ RIGHT    CompressibilityPhasicitySpontaneityPropertiesThrombus Aging +---------+---------------+---------+-----------+----------+--------------+ CFV      Full           Yes      Yes                                 +---------+---------------+---------+-----------+----------+--------------+ SFJ      Full                                                        +---------+---------------+---------+-----------+----------+--------------+ FV Prox  Full                                                        +---------+---------------+---------+-----------+----------+--------------+ FV Mid   Full                                                        +---------+---------------+---------+-----------+----------+--------------+ FV DistalFull                                                        +---------+---------------+---------+-----------+----------+--------------+ PFV      Full                                                        +---------+---------------+---------+-----------+----------+--------------+  POP      Full           Yes      Yes                                 +---------+---------------+---------+-----------+----------+--------------+ PTV      Full                                                        +---------+---------------+---------+-----------+----------+--------------+ PERO     Full                                                        +---------+---------------+---------+-----------+----------+--------------+   +---------+---------------+---------+-----------+----------+--------------+ LEFT     CompressibilityPhasicitySpontaneityPropertiesThrombus Aging +---------+---------------+---------+-----------+----------+--------------+ CFV      Full           Yes      Yes                                  +---------+---------------+---------+-----------+----------+--------------+ SFJ      Full                                                        +---------+---------------+---------+-----------+----------+--------------+ FV Prox  Full                                                        +---------+---------------+---------+-----------+----------+--------------+ FV Mid   Full                                                        +---------+---------------+---------+-----------+----------+--------------+ FV DistalFull                                                        +---------+---------------+---------+-----------+----------+--------------+ PFV      Full                                                        +---------+---------------+---------+-----------+----------+--------------+ POP      Full           Yes      Yes                                 +---------+---------------+---------+-----------+----------+--------------+  PTV      Full                                                        +---------+---------------+---------+-----------+----------+--------------+ PERO     Full                                                        +---------+---------------+---------+-----------+----------+--------------+     Summary: RIGHT: - There is no evidence of deep vein thrombosis in the lower extremity.  - No cystic structure found in the popliteal fossa.  LEFT: - There is no evidence of deep vein thrombosis in the lower extremity.  - No cystic structure found in the popliteal fossa.  *See table(s) above for measurements and observations. Electronically signed by Lemar Livings MD on 04/19/2023 at 7:09:46 PM.    Final    [place] Abscess Drain  Result Date: 04/18/2023 INDICATION: 67 year old with large abdominal subcutaneous fluid collections following panniculectomy and repair of abdominal hernia. EXAM: 1. Placement of percutaneous drain in the  right abdominal subcutaneous fluid collection 2. Placement of percutaneous drain in the left abdominal subcutaneous fluid collection MEDICATIONS: Moderate sedation ANESTHESIA/SEDATION: Moderate (conscious) sedation was employed during this procedure. A total of Versed 2mg  and fentanyl 100 mcg was administered intravenously at the order of the provider performing the procedure. Total intra-service moderate sedation time: 40 minutes. Patient's level of consciousness and vital signs were monitored continuously by radiology nurse throughout the procedure under the supervision of the provider performing the procedure. COMPLICATIONS: None immediate. PROCEDURE: Informed written consent was obtained from the patient after a thorough discussion of the procedural risks, benefits and alternatives. All questions were addressed. A timeout was performed prior to the initiation of the procedure. Both sides of the abdomen were obtained. Large subcutaneous fluid collections were identified near the surgical incisions on both sides of the abdomen. The left and right side of the abdomen were prepped with chlorhexidine and sterile field was created. Skin was anesthetized along the right side of the abdomen with 1% lidocaine and a small incision was made. Using ultrasound guidance, a Yueh catheter was directed into the large subcutaneous fluid collection and serosanguineous fluid was aspirated. Superstiff Amplatz wire was advanced into the collection. The tract was dilated to accommodate a 12 [place] drain. Large amount of serosanguineous fluid was removed. At one point, the drain needed to be flush to continue aspirating fluid. Drain was attached to a suction bulb. Ultrasound demonstrated a large amount of residual subcutaneous fluid along the left side of the abdomen. Therefore, attention was directed to the left side of the abdominal subcutaneous fluid collection. The left side of the abdomen was anesthetized with 1% lidocaine and a  small incision was made. Using ultrasound guidance, a Yueh catheter was directed into the subcutaneous fluid collection and serosanguineous fluid was aspirated. Superstiff Amplatz wire was advanced into the collection and the tract was dilated to accommodate a 12 [place] drain. Additional serosanguineous fluid was removed. Both drains were attached to suction bulbs. Sample sent for culture. Both drains were sutured to skin and sterile dressings were placed. FINDINGS: Large complex fluid collections in the anterior abdomen  most prominent along the lateral aspects of the abdomen. Bilateral 12 French drains were placed. Greater than 900 mL of serosanguineous fluid was removed from these collections. The subcutaneous fluid collections were markedly decreased by the end of the procedure. IMPRESSION: Ultrasound-guided placement of two abdominal subcutaneous drainage catheters. Greater than 900 mL of serosanguineous fluid was removed from the abdominal subcutaneous collections. Fluid was sent for culture. Electronically Signed   By: Richarda Overlie M.D.   On: 04/18/2023 16:39   [place] Abscess Drain  Result Date: 04/18/2023 INDICATION: 67 year old with large abdominal subcutaneous fluid collections following panniculectomy and repair of abdominal hernia. EXAM: 1. Placement of percutaneous drain in the right abdominal subcutaneous fluid collection 2. Placement of percutaneous drain in the left abdominal subcutaneous fluid collection MEDICATIONS: Moderate sedation ANESTHESIA/SEDATION: Moderate (conscious) sedation was employed during this procedure. A total of Versed 2mg  and fentanyl 100 mcg was administered intravenously at the order of the provider performing the procedure. Total intra-service moderate sedation time: 40 minutes. Patient's level of consciousness and vital signs were monitored continuously by radiology nurse throughout the procedure under the supervision of the provider performing the procedure. COMPLICATIONS: None  immediate. PROCEDURE: Informed written consent was obtained from the patient after a thorough discussion of the procedural risks, benefits and alternatives. All questions were addressed. A timeout was performed prior to the initiation of the procedure. Both sides of the abdomen were obtained. Large subcutaneous fluid collections were identified near the surgical incisions on both sides of the abdomen. The left and right side of the abdomen were prepped with chlorhexidine and sterile field was created. Skin was anesthetized along the right side of the abdomen with 1% lidocaine and a small incision was made. Using ultrasound guidance, a Yueh catheter was directed into the large subcutaneous fluid collection and serosanguineous fluid was aspirated. Superstiff Amplatz wire was advanced into the collection. The tract was dilated to accommodate a 12 [place] drain. Large amount of serosanguineous fluid was removed. At one point, the drain needed to be flush to continue aspirating fluid. Drain was attached to a suction bulb. Ultrasound demonstrated a large amount of residual subcutaneous fluid along the left side of the abdomen. Therefore, attention was directed to the left side of the abdominal subcutaneous fluid collection. The left side of the abdomen was anesthetized with 1% lidocaine and a small incision was made. Using ultrasound guidance, a Yueh catheter was directed into the subcutaneous fluid collection and serosanguineous fluid was aspirated. Superstiff Amplatz wire was advanced into the collection and the tract was dilated to accommodate a 12 [place] drain. Additional serosanguineous fluid was removed. Both drains were attached to suction bulbs. Sample sent for culture. Both drains were sutured to skin and sterile dressings were placed. FINDINGS: Large complex fluid collections in the anterior abdomen most prominent along the lateral aspects of the abdomen. Bilateral 12 French drains were placed. Greater than 900  mL of serosanguineous fluid was removed from these collections. The subcutaneous fluid collections were markedly decreased by the end of the procedure. IMPRESSION: Ultrasound-guided placement of two abdominal subcutaneous drainage catheters. Greater than 900 mL of serosanguineous fluid was removed from the abdominal subcutaneous collections. Fluid was sent for culture. Electronically Signed   By: Richarda Overlie M.D.   On: 04/18/2023 16:39   CT ABDOMEN PELVIS W CONTRAST  Result Date: 04/17/2023 CLINICAL DATA:  Ventral hernia open repair 2 weeks ago with mesh replacement, lower abdominal pain EXAM: CT ABDOMEN AND PELVIS WITH CONTRAST TECHNIQUE: Multidetector CT imaging of  the abdomen and pelvis was performed using the standard protocol following bolus administration of intravenous contrast. RADIATION DOSE REDUCTION: This exam was performed according to the departmental dose-optimization program which includes automated exposure control, adjustment of the mA and/or kV according to patient size and/or use of iterative reconstruction technique. CONTRAST:  OMNIPAQUE IOHEXOL 300 MG/ML  SOLN COMPARISON:  01/04/2023 FINDINGS: Lower chest: Small type 1 hiatal hernia. Hepatobiliary: Unremarkable Pancreas: Unremarkable Spleen: Unremarkable Adrenals/Urinary Tract: Unremarkable Stomach/Bowel: Mast Modic staple line in the right small bowel (image 56, series 2) and in the rectum (image 74 series 2.). Normal appendix. No dilated bowel were substantially abnormal thick-walled bowel. Vascular/Lymphatic: Unremarkable Reproductive: Uterus absent.  Adnexa unremarkable. Other: No ascites. Musculoskeletal: The dominant complex ventral hernia has been repaired with mesh in place. There is edema and stranding along the mesh, not necessarily unexpected in this postoperative stage, there is also greater than expected nodularity in the omentum cephalad to the mesh for example on images 27 through 38 of series 2. Some of this may be from  omental edema but the possibility of omental infarct or infectious process along the omentum is not totally excluded. Do not see any gas to indicate an abscess in this region. There were two previous upper abdominal hernias containing adipose tissue, the most cephalad has mildly enlarged with herniated adipose tissue currently measuring 4.4 by 2.0 by 2.9 cm. The more caudad, which is probably covered by the mesh, now presents as small lobulations of fluid density in the subcutaneous tissues at the site of prior hernia for example on images 41 through 48 of series 4. I am uncertain if this represents residual hernia sac containing fluid versus simply some subcutaneous nodularity. There is a large tubular fluid density process along the pannus along with reduction in volume of pannus, compatible with panniculectomy. Surrounding subcutaneous stranding is present. Miniscule loculation of gas along the right margin of this panniculectomy site on image 52 series 2, much less than 1 cc, possibly incidental. I do not see abnormal enhancement along the margins of the panniculectomy site to indicate an abscess. Correlate with quality of drainage at the wound site, if any. Lower lumbar spondylosis and degenerative disc disease causing right foraminal impingement at L4-5 and left foraminal impingement at L5-S1. Mildly transitional S1 vertebra. IMPRESSION: 1. Interval panniculectomy with a large tubular fluid density process along the pannus, but no rim enhancement to indicate abscess. Miniscule loculation of gas along the right margin of this panniculectomy site, much less than 1 cc, possibly incidental. Correlate with quality of drainage at the wound site, if any. 2. Interval repair of the dominant complex ventral hernia with mesh in place. There is edema and stranding along the mesh, not necessarily unexpected in this postoperative stage, but there is also greater than expected nodularity in the omentum cephalad to the mesh.  Some of this may be from omental edema but the possibility of omental infarct or infectious process along the upper omentum is not totally excluded. 3. There were two previous upper abdominal hernias containing adipose tissue, the most cephalad has mildly enlarged and at the site of the more caudad there is lobular fluid density in the subcutaneous tissues. 4. Small type 1 hiatal hernia. 5. Lower lumbar spondylosis and degenerative disc disease causing right foraminal impingement at L4-5 and left foraminal impingement at L5-S1. Electronically Signed   By: Gaylyn Rong M.D.   On: 04/17/2023 21:26   DG Chest Port 1 View  Result Date: 04/17/2023 CLINICAL  DATA:  Sepsis, recent hernia repair EXAM: PORTABLE CHEST 1 VIEW COMPARISON:  04/06/2023 FINDINGS: Single frontal view of the chest demonstrates an unremarkable cardiac silhouette. Improved aeration at the lung bases. No airspace disease, effusion, or pneumothorax. No acute bony abnormalities. IMPRESSION: 1. No acute intrathoracic process. Electronically Signed   By: Sharlet Salina M.D.   On: 04/17/2023 20:07    Labs:  CBC: Recent Labs    04/19/23 0331 04/20/23 0325 04/21/23 0250 05/10/23 1450  WBC 9.7 6.7 6.2 7.2  HGB 9.0* 8.2* 8.4* 11.2*  HCT 29.0* 26.7* 27.6* 35.7*  PLT 226 233 287 279    COAGS: Recent Labs    03/27/23 1323 04/17/23 1942 05/10/23 1450  INR 1.0 1.0 1.0  APTT 27 31  --     BMP: Recent Labs    04/19/23 0331 04/20/23 0325 04/21/23 0250 04/23/23 0333 05/10/23 1450  NA 136 136 136 135  --   K 3.7 3.9 4.9 3.4*  --   CL 102 105 108 101  --   CO2 25 21* 20* 24  --   GLUCOSE 118* 89 81 94  --   BUN 24* 16 14 10   --   CALCIUM 7.6* 8.0* 8.3* 8.4*  --   CREATININE 1.58* 0.83 0.73 0.75 0.87  GFRNONAA 36* >60 >60 >60 >60    LIVER FUNCTION TESTS: Recent Labs    04/17/23 1942 04/18/23 0343  BILITOT 1.1 1.0  AST 22 18  ALT 17 14  ALKPHOS 88 68  PROT 6.7 5.4*  ALBUMIN 3.5 2.8*    TUMOR MARKERS: No  results for input(s): "AFPTM", "CEA", "CA199", "CHROMGRNA" in the last 8760 hours.  Assessment and Plan: 67 y.o. female with recurrent/persistent abdominal wall fluid collection after ventral hernia repair with mesh on 04/04/23 who is in need of drain placement.   VSS CBC yesterday with no leukocytosis, hgb 11.2, plt 279  INR 1.0 yesterday  Not on AC/AP Allergies reviewed   Risks and benefits discussed with the patient including bleeding, infection, damage to adjacent structures, bowel perforation/fistula connection, and sepsis.  All of the patient's questions were answered, patient is agreeable to proceed. Consent signed and in chart.  The procedure is tentatively scheduled for tomorrow pending IR schedule.   Plan - NPO except meds at [place]   Thank you for this interesting consult.  I greatly enjoyed meeting Jacqueline Mora and look forward to participating in their care.  A copy of this report was sent to the requesting provider on this date.  Electronically Signed: Willette Brace, PA-C 05/11/2023, 1:29 PM   I spent a total of 20 Minutes    in face to face in clinical consultation, greater than 50% of which was counseling/coordinating care for abdominal wall fluid drain placement.   This chart was dictated using voice recognition software.  Despite best efforts to proofread,  errors can occur which can change the documentation meaning.

## 2023-05-11 NOTE — Plan of Care (Signed)
  Problem: Education: Goal: Knowledge of General Education information will improve Description: Including pain rating scale, medication(s)/side effects and non-pharmacologic comfort measures Outcome: Progressing   Problem: Clinical Measurements: Goal: Ability to maintain clinical measurements within normal limits will improve Outcome: Progressing   

## 2023-05-12 ENCOUNTER — Inpatient Hospital Stay (HOSPITAL_COMMUNITY): Payer: Medicare Other

## 2023-05-12 MED ORDER — NALOXONE HCL 0.4 MG/ML IJ SOLN
INTRAMUSCULAR | Status: AC
  Filled 2023-05-12: qty 1

## 2023-05-12 MED ORDER — MIDAZOLAM HCL 2 MG/2ML IJ SOLN
INTRAMUSCULAR | Status: AC | PRN
Start: 2023-05-12 — End: 2023-05-12
  Administered 2023-05-12: 1 mg via INTRAVENOUS

## 2023-05-12 MED ORDER — FENTANYL CITRATE (PF) 100 MCG/2ML IJ SOLN
INTRAMUSCULAR | Status: AC
  Filled 2023-05-12: qty 2

## 2023-05-12 MED ORDER — MIDAZOLAM HCL 2 MG/2ML IJ SOLN
INTRAMUSCULAR | Status: AC
  Filled 2023-05-12: qty 2

## 2023-05-12 MED ORDER — FENTANYL CITRATE (PF) 100 MCG/2ML IJ SOLN
INTRAMUSCULAR | Status: AC | PRN
  Administered 2023-05-12: 25 ug via INTRAVENOUS

## 2023-05-12 NOTE — Progress Notes (Signed)
   Subjective/Chief Complaint: Got  drain today    Objective: Vital signs in last 24 hours: Temp:  [97.7 F (36.5 C)-98.8 F (37.1 C)] 97.7 F (36.5 C) (09/22 1106) Pulse Rate:  [60-70] 64 (09/22 1106) Resp:  [17-18] 18 (09/22 1106) BP: (130-226)/(65-118) 137/66 (09/22 1106) SpO2:  [95 %-99 %] 98 % (09/22 1106) Last BM Date : 05/12/23  Intake/Output from previous day: 09/21 0701 - 09/22 0700 In: 1040 [P.O.:840; IV Piggyback:200] Out: 703 [Urine:702; Stool:1] Intake/Output this shift: Total I/O In: 0  Out: 175 [Drains:175]  General appearance: alert and cooperative Resp: clear to auscultation bilaterally Cardio: NSR  Incision/Wound: drain in place  Mild erythema left lateral stable from yesterday   Lab Results:  Recent Labs    05/10/23 1450  WBC 7.2  HGB 11.2*  HCT 35.7*  PLT 279   BMET Recent Labs    05/10/23 1450  CREATININE 0.87   PT/INR Recent Labs    05/10/23 1450  LABPROT 13.6  INR 1.0   ABG No results for input(s): "PHART", "HCO3" in the last 72 hours.  Invalid input(s): "PCO2", "PO2"  Studies/Results: [place] EKG SITE RITE  Result Date: 05/10/2023 If Site Rite image not attached, placement could not be confirmed due to current cardiac rhythm.   Anti-infectives: Anti-infectives (From admission, onward)    Start     Dose/Rate Route Frequency Ordered Stop   05/11/23 1600  vancomycin (VANCOREADY) IVPB 1750 mg/350 mL  Status:  Discontinued        1,750 mg 175 mL/hr over 120 Minutes Intravenous Every 24 hours 05/10/23 1533 05/11/23 0835   05/11/23 1600  vancomycin (VANCOREADY) IVPB 1500 mg/300 mL        1,500 mg 150 mL/hr over 120 Minutes Intravenous Every 24 hours 05/11/23 0835 05/18/23 1559   05/10/23 1700  ceFEPIme (MAXIPIME) 2 g in sodium chloride 0.9 % 100 mL IVPB        2 g 200 mL/hr over 30 Minutes Intravenous Every 8 hours 05/10/23 1554     05/10/23 1530  vancomycin (VANCOREADY) IVPB 2000 mg/400 mL        2,000 mg 200 mL/hr over 120  Minutes Intravenous  Once 05/10/23 1441 05/10/23 1947       Assessment/Plan:  Recurrent seroma    IR drain  Regular diet    Dr. Michaell Cowing is ordered PICC line.  She may need home antibiotics.     White count normal no signs of sepsis  LOS: 2 days    Clovis Pu Jacoby Ritsema 05/12/2023

## 2023-05-12 NOTE — Progress Notes (Signed)
Spoke with Sarala RN re PICC order.  Still "waiting on culture findings" per Dr Michaell Cowing notes on 9/20 for potential "may need home antibiotics".  Please notify or update PICC order to notify when definitive need for PICC placement decision made and number of lumens needed for OPAT. Sarala RN states current PIV access working well and IR sent cultures today.

## 2023-05-12 NOTE — Procedures (Signed)
Interventional Radiology Procedure Note  Procedure: CT guided abdominal wall drain placement  Findings: Please refer to procedural dictation for full description. 12 Fr biliary type (multiple side hole) drain placed via RLQ approach, to bulb suction.  Serous fluid aspirated, approx 300 mL, sent for culture.  Complications: None immediate  Estimated Blood Loss: < 5 mL  Recommendations: Keep to bulb suction for now. Follow culture. IR will arrange for drain clinic follow up.   Marliss Coots, MD

## 2023-05-13 LAB — CREATININE, SERUM
Creatinine, Ser: 0.81 mg/dL (ref 0.44–1.00)
GFR, Estimated: >60 mL/min (ref >60)

## 2023-05-13 MED ORDER — SODIUM CHLORIDE 0.9% FLUSH: 5.0000 mL | Freq: Three times a day (TID) | INTRAVENOUS | 1 refills | Status: DC

## 2023-05-13 MED ORDER — OXYCODONE HCL 5 MG PO TABS: 5.0000 mg | ORAL_TABLET | Freq: Four times a day (QID) | ORAL | 0 refills | Status: DC | PRN

## 2023-05-13 MED ORDER — ACETAMINOPHEN 325 MG PO TABS: 650.0000 mg | ORAL_TABLET | Freq: Four times a day (QID) | ORAL | Status: DC | PRN

## 2023-05-13 MED ORDER — CEFADROXIL 1 G PO TABS: 1.0000 g | ORAL_TABLET | Freq: Two times a day (BID) | ORAL | 0 refills | Status: AC

## 2023-05-13 MED ORDER — SODIUM CHLORIDE 0.9% FLUSH
5.0000 mL | Freq: Three times a day (TID) | INTRAVENOUS | Status: DC
  Administered 2023-05-13: 5 mL

## 2023-05-13 NOTE — Progress Notes (Signed)
Progress Note     Subjective: Pt reports she still has some discomfort seems worse at night. Tolerating diet and having bowel function. She is afebrile. She would like to go home if possible. Familiar with drain care.   Objective: Vital signs in last 24 hours: Temp:  [97.7 F (36.5 C)-98.6 F (37 C)] 97.7 F (36.5 C) (09/23 0603) Pulse Rate:  [68-72] 68 (09/23 0603) Resp:  [18] 18 (09/23 0603) BP: (139-148)/(69-71) 139/69 (09/23 0603) SpO2:  [96 %-97 %] 96 % (09/23 0603) Last BM Date : 05/12/23  Intake/Output from previous day: 09/22 0701 - 09/23 0700 In: 988 [P.O.:360; IV Piggyback:628] Out: 465 [Urine:100; Drains:365] Intake/Output this shift: Total I/O In: 240 [P.O.:240] Out: 10 [Drains:10]  PE: General: pleasant, WD, obese female who is laying in bed in NAD Heart: regular, rate, and rhythm.  Lungs: Respiratory effort nonlabored Abd: soft, appropriately ttp, drain in right lateral abdomen with serous fluid  Psych: A&Ox3 with an appropriate affect.    Lab Results:  Recent Labs    05/10/23 1450  WBC 7.2  HGB 11.2*  HCT 35.7*  PLT 279   BMET Recent Labs    05/10/23 1450 05/13/23 0434  CREATININE 0.87 0.81   PT/INR Recent Labs    05/10/23 1450  LABPROT 13.6  INR 1.0   CMP     Component Value Date/Time   NA 135 04/23/2023 0333   NA 138 10/16/2021 1602   K 3.4 (L) 04/23/2023 0333   CL 101 04/23/2023 0333   CO2 24 04/23/2023 0333   GLUCOSE 94 04/23/2023 0333   BUN 10 04/23/2023 0333   BUN 15 10/16/2021 1602   CREATININE 0.81 05/13/2023 0434   CALCIUM 8.4 (L) 04/23/2023 0333   PROT 5.4 (L) 04/18/2023 0343   ALBUMIN 2.8 (L) 04/18/2023 0343   AST 18 04/18/2023 0343   ALT 14 04/18/2023 0343   ALKPHOS 68 04/18/2023 0343   BILITOT 1.0 04/18/2023 0343   GFRNONAA >60 05/13/2023 0434   Lipase  No results found for: "LIPASE"     Studies/Results: CT GUIDED PERITONEAL/RETROPERITONEAL FLUID DRAIN BY PERC CATH  Result Date:  05/12/2023 INDICATION: 67 year old female with history of abdominal wall seroma after panniculectomy status post recent percutaneous drain placement and removal presenting now with reaccumulation of seroma. EXAM: CT IMAGE GUIDED DRAINAGE BY PERCUTANEOUS CATHETER COMPARISON:  None Available. MEDICATIONS: The patient is currently admitted to the hospital and receiving intravenous antibiotics. The antibiotics were administered within an appropriate time frame prior to the initiation of the procedure. ANESTHESIA/SEDATION: Moderate (conscious) sedation was employed during this procedure. A total of Versed 1 mg and Fentanyl 25 mcg was administered intravenously. Moderate Sedation Time: 15 minutes. The patient's level of consciousness and vital signs were monitored continuously by radiology nursing throughout the procedure under my direct supervision. CONTRAST:  None COMPLICATIONS: None immediate. PROCEDURE: RADIATION DOSE REDUCTION: This exam was performed according to the departmental dose-optimization program which includes automated exposure control, adjustment of the mA and/or kV according to patient size and/or use of iterative reconstruction technique. Informed written consent was obtained from the patient after a discussion of the risks, benefits and alternatives to treatment. The patient was placed supine on the CT gantry and a pre procedural CT was performed re-demonstrating the known abscess/fluid collection within the lower abdominal wall. The procedure was planned. A timeout was performed prior to the initiation of the procedure. The right lower quadrant was prepped and draped in the usual sterile fashion. The overlying soft  tissues were anesthetized with 1% lidocaine with epinephrine. Appropriate trajectory was planned with the use of a 22 gauge spinal needle. An 18 gauge trocar needle was advanced into the abscess/fluid collection and a short Amplatz super stiff wire was coiled within the collection.  Appropriate positioning was confirmed with a limited CT scan. The tract was serially dilated allowing placement of a 10 [place] biliary drainage catheter. Appropriate positioning was confirmed with a limited postprocedural CT scan. Approximately 400 ml of translucent, straw-colored fluid was aspirated. The tube was connected to a bulb suction and sutured in place. A dressing was placed. The patient tolerated the procedure well without immediate post procedural complication. IMPRESSION: Successful CT guided placement of a 10 [place] biliary type drain catheter into the anterior abdominal wall fluid collection with aspiration of approximately 400 mL of translucent, straw-colored fluid. Samples were sent to the laboratory as requested by the ordering clinical team. Marliss Coots, MD [facility] Electronically Signed   By: Marliss Coots M.D.   On: 05/12/2023 20:38    Anti-infectives: Anti-infectives (From admission, onward)    Start     Dose/Rate Route Frequency Ordered Stop   05/11/23 1600  vancomycin (VANCOREADY) IVPB 1750 mg/350 mL  Status:  Discontinued        1,750 mg 175 mL/hr over 120 Minutes Intravenous Every 24 hours 05/10/23 1533 05/11/23 0835   05/11/23 1600  vancomycin (VANCOREADY) IVPB 1500 mg/300 mL        1,500 mg 150 mL/hr over 120 Minutes Intravenous Every 24 hours 05/11/23 0835 05/18/23 1559   05/10/23 1700  ceFEPIme (MAXIPIME) 2 g in sodium chloride 0.9 % 100 mL IVPB        2 g 200 mL/hr over 30 Minutes Intravenous Every 8 hours 05/10/23 1554     05/10/23 1530  vancomycin (VANCOREADY) IVPB 2000 mg/400 mL        2,000 mg 200 mL/hr over 120 Minutes Intravenous  Once 05/10/23 1441 05/10/23 1947        Assessment/Plan  Recurrent seroma  S/P laparoscopic abdominal hernia repair with mesh, panniculectomy 8/15 Dr. Michaell Cowing - Previous drain from 8/29 grew staph aureus resistant to tetracycline but otherwise fairly sensitive -  recurrent large fluid collection - s/p drain placement yesterday with large volume serous fluid - cx pending but afebrile and no leukocytosis - will discuss with MD but seems like she could go home with drain and PO abx and follow up in the office next week   FEN: reg diet  VTE: SCDs ID: [email or web address] - DC vanc, pt reports she developed reaction to vanc   LOS: 3 days   I reviewed Consultant IR notes, last 24 h vitals and pain scores, last 48 h intake and output, last 24 h labs and trends, and last 24 h imaging results.    Juliet Rude, [facility] Surgery 05/13/2023, 11:31 AM Please see Amion for pager number during day hours 7:00am-4:30pm

## 2023-05-13 NOTE — TOC Initial Note (Signed)
Transition of Care [facility]) - Initial/Assessment Note   Patient Details  Name: Jacqueline Mora MRN: [id number] Date of Birth: 05-01-1956  Transition of [facility]) CM/SW Contact:    Ewing Schlein, LCSW Phone Number: 05/13/2023, 11:31 AM  Clinical Narrative: TOC following for possible IV antibiotics. Tentative referral made to [facility] with [facility] in the event patient will need IV antibiotics at discharge. CSW updated patient. TOC awaiting cultures and final antibiotic recommendations.  Expected Discharge Plan: Home w Home Health Services Barriers to Discharge: Continued Medical Work up  Patient Goals and CMS Choice Patient states their goals for this hospitalization and ongoing recovery are:: Return home CMS Medicare.gov Compare Post Acute Care list provided to:: Patient Choice offered to / list presented to : Patient  Expected Discharge Plan and Services In-house Referral: Clinical Social Work Post Acute Care Choice: Home Health Living arrangements for the past 2 months: Single Family Home           DME Arranged: N/A DME Agency: NA HH Arranged: IV Antibiotics HH Agency: [facility] Date HH Agency Contacted: 05/13/23 Time HH Agency Contacted: [phone] Representative spoke with at [facility] Agency: Pam  Prior Living Arrangements/Services Living arrangements for the past 2 months: Single Family Home Lives with:: Self Patient language and need for interpreter reviewed:: Yes Do you feel safe going back to the place where you live?: Yes      Need for Family Participation in Patient Care: No (Comment) Care giver support system in place?: Yes (comment) Criminal Activity/Legal Involvement Pertinent to Current Situation/Hospitalization: No - Comment as needed  Activities of Daily Living Home Assistive Devices/Equipment: None ADL Screening (condition at time of admission) Patient's cognitive ability adequate to safely complete daily activities?: Yes Is the patient deaf or have difficulty hearing?:  No Does the patient have difficulty seeing, even when wearing glasses/contacts?: No Does the patient have difficulty concentrating, remembering, or making decisions?: No Patient able to express need for assistance with ADLs?: Yes Does the patient have difficulty dressing or bathing?: No Independently performs ADLs?: Yes (appropriate for developmental age) Does the patient have difficulty walking or climbing stairs?: No Weakness of Legs: None Weakness of Arms/Hands: None  Permission Sought/Granted Permission sought to share information with : Other (comment) Permission granted to share information with : Yes, Verbal Permission Granted Permission granted to share info w AGENCY: HH agencies, [facility]  Emotional Assessment Attitude/Demeanor/Rapport: Engaged Affect (typically observed): Accepting Orientation: : Oriented to Self, Oriented to Place, Oriented to  Time, Oriented to Situation Alcohol / Substance Use: Not Applicable Psych Involvement: No (comment)  Admission diagnosis:  Abdominal wall seroma [S30.1XXA] Patient Active Problem List   Diagnosis Date Noted   Abdominal wall seroma 05/10/2023   CKD (chronic kidney disease) stage 2, GFR 60-89 ml/min 04/19/2023   Hypokalemia 04/18/2023   Nausea & vomiting 04/18/2023   Abscess of abdominal wall 04/18/2023   Fever postop 04/17/2023   Acute blood loss anemia (ABLA) 04/09/2023   Hypothyroidism    Prediabetes    IBS (irritable bowel syndrome)    GERD (gastroesophageal reflux disease)    Anxiety    Recurrent incisional hernia 04/04/2023   Elevated coronary artery calcium score 02/20/2023   Personal history of colon cancer 01/28/2023   DOE (dyspnea on exertion) 12/08/2020   Morbid obesity (HCC) 12/08/2020   Essential hypertension 12/08/2020   Hyperlipidemia 12/08/2020   Chest pain of uncertain etiology 12/08/2020   Muscle weakness (generalized) 03/20/2020   Malignant melanoma (HCC) 12/07/2019   Migraine 12/04/2019  Genital  herpes simplex 12/04/2019   Constipation 09/02/2018   Dizziness 01/29/2018   Post-concussion syndrome 01/29/2018   Intractable post-traumatic headache 06/19/2017   Adjustment insomnia 06/19/2017   Sensitive skin 08/20/1998   PCP:  Jarrett Soho, PA-C Pharmacy:   [facility] - [place], [place] - [address] [place] [address] Phone: [phone] Fax: [phone]  [facility] [id number] [place], [place] - [address] [place] [address] Phone: [phone] Fax: [phone]  Social Determinants of Health (SDOH) Social History: SDOH Screenings   Food Insecurity: Patient Declined (05/10/2023)  Housing: Patient Declined (05/10/2023)  Transportation Needs: Patient Declined (05/10/2023)  Utilities: Patient Declined (05/10/2023)  Tobacco Use: Medium Risk (05/11/2023)   SDOH Interventions:    Readmission Risk Interventions    04/19/2023    2:23 PM 04/05/2023    9:38 AM  Readmission Risk Prevention Plan  Transportation Screening Complete Complete  PCP or Specialist Appt within 5-7 Days  Complete  PCP or Specialist Appt within 3-5 Days Complete   Home Care Screening  Complete  Medication Review (RN CM)  Complete  HRI or Home Care Consult Complete   Social Work Consult for Recovery Care Planning/Counseling Complete   Palliative Care Screening Not Applicable   Medication Review Oceanographer) Complete

## 2023-05-13 NOTE — Care Management Important Message (Signed)
Important Message  Patient Details IM Letter given. Name: IVALEE AMABILE MRN: [id number] Date of Birth: Jan 10, 1956   Medicare Important Message Given:  Yes     [name] 05/13/2023, 12:25 PM

## 2023-05-13 NOTE — Progress Notes (Signed)
PHARMACY NOTE -  Cefepime  Pharmacy has been assisting with dosing of cefepime for postoperative abdominal abscess. Dosage remains stable at 2g IV q8 hr and further renal adjustments per institutional Pharmacy antibiotic protocol  Pharmacy will sign off, following peripherally for culture results, dose adjustments, and length of therapy. Please reconsult if a change in clinical status warrants re-evaluation of dosage.  Bernadene Person, PharmD, BCPS [phone] 05/13/2023, 1:17 PM

## 2023-05-13 NOTE — Progress Notes (Signed)
 Patient discharged home, IV removed, discharge paperwork explained and provided, patient verbalized understanding.

## 2023-05-13 NOTE — Progress Notes (Addendum)
Referring Physician(s): Blackman,D  Supervising Physician: Malachy Moan  Patient Status:  [name] - In-pt  Chief Complaint:  Abdominal fluid collection  Subjective: Patient feeling better since abdominal drain placed yesterday; denies fever, nausea, vomiting   Allergies: Amlodipine besylate, Eszopiclone, Tape, Zocor [simvastatin], Clindamycin hcl, Meloxicam, Methylprednisolone, and Penicillin g  Medications: Prior to Admission medications   Medication Sig Start Date End Date Taking? Authorizing Provider  ALPRAZolam Prudy Feeler) 0.5 MG tablet Take 0.5 mg by mouth as needed for anxiety.   Yes [provider]  cefadroxil (DURICEF) 500 MG/5ML suspension Take 10 mLs by mouth every 12 (twelve) hours.   Yes [provider]  cholecalciferol (VITAMIN D) 1000 units tablet Take 1,000 Units by mouth 4 (four) times a week.   Yes [provider]  Cyanocobalamin (B-12 PO) Take 1 tablet by mouth 4 (four) times a week.   Yes [provider]  estradiol (ESTRACE) 1 MG tablet Take 1 mg by mouth daily.   Yes [provider]  ezetimibe (ZETIA) 10 MG tablet Take 10 mg by mouth daily. 02/25/23  Yes [provider]  FLUoxetine (PROZAC) 10 MG capsule Take 20 mg by mouth daily.   Yes [provider]  fluticasone (FLONASE) 50 MCG/ACT nasal spray Place 1 spray into both nostrils daily as needed for allergies. 12/12/16  Yes [provider]  furosemide (LASIX) 40 MG tablet TAKE 1 TABLET(40 MG TOTAL) BY MOUTH DAILY Patient taking differently: Take 40 mg by mouth daily. 07/09/22  Yes Chandrasekhar, Mahesh A, MD  ketoconazole (NIZORAL) 2 % cream Apply 1 Application topically daily. 08/22/21  Yes [provider]  levothyroxine (SYNTHROID) 150 MCG tablet Take 150 mcg by mouth daily before breakfast.   Yes [provider]  losartan (COZAAR) 100 MG tablet Take 100 mg by mouth daily. Holding per Dr. 05/10/2023   Yes [provider]  ondansetron (ZOFRAN-ODT) 4 MG disintegrating tablet Take 1 tablet (4 mg total) by mouth every 6 (six) hours as needed for nausea. 04/23/23  Yes Karie Soda, MD  oxyCODONE 10 MG TABS Take 1-1.5 tablets (10-15 mg total) by mouth every 6 (six) hours as needed for severe pain or breakthrough pain. 04/09/23  Yes Karie Soda, MD  RABEprazole (ACIPHEX) 20 MG tablet Take 20 mg by mouth daily.   Yes [provider]  spironolactone (ALDACTONE) 25 MG tablet TAKE ONE TABLET BY MOUTH DAILY DOSE INCREASE Patient taking differently: Take 25 mg by mouth once. 10/08/22  Yes Chandrasekhar, Mahesh A, MD  losartan (COZAAR) 100 MG tablet Take 100 mg by mouth daily.    [provider]     Vital Signs: BP 130/64   Pulse 72   Temp 97.8 F (36.6 C) (Oral)   Resp 18   Ht 5\' 5"  (1.651 m)   Wt 268 lb 8.3 oz (121.8 kg)   SpO2 96%   BMI 44.68 kg/m   Physical Exam awake, alert.  Right lower abdominal drain intact, insertion site okay, not significantly tender, output 365 cc yesterday ,30 cc today of serosanguineous fluid; drain flushed without difficulty  Imaging: CT GUIDED PERITONEAL/RETROPERITONEAL FLUID DRAIN BY PERC CATH  Result Date: 05/12/2023 INDICATION: 67 year old female with history of abdominal wall seroma after panniculectomy status post recent percutaneous drain placement and removal presenting now with reaccumulation of seroma. EXAM: CT IMAGE GUIDED DRAINAGE BY PERCUTANEOUS CATHETER COMPARISON:  None Available. MEDICATIONS: The patient is currently admitted to the hospital and receiving intravenous antibiotics. The antibiotics were administered within  an appropriate time frame prior to the initiation of the procedure. ANESTHESIA/SEDATION: Moderate (conscious) sedation was employed during this procedure. A total of Versed 1 mg and Fentanyl 25 mcg was administered intravenously. Moderate Sedation Time: 15 minutes. The patient's level of consciousness and vital signs were  monitored continuously by radiology nursing throughout the procedure under my direct supervision. CONTRAST:  None COMPLICATIONS: None immediate. PROCEDURE: RADIATION DOSE REDUCTION: This exam was performed according to the departmental dose-optimization program which includes automated exposure control, adjustment of the mA and/or kV according to patient size and/or use of iterative reconstruction technique. Informed written consent was obtained from the patient after a discussion of the risks, benefits and alternatives to treatment. The patient was placed supine on the CT gantry and a pre procedural CT was performed re-demonstrating the known abscess/fluid collection within the lower abdominal wall. The procedure was planned. A timeout was performed prior to the initiation of the procedure. The right lower quadrant was prepped and draped in the usual sterile fashion. The overlying soft tissues were anesthetized with 1% lidocaine with epinephrine. Appropriate trajectory was planned with the use of a 22 gauge spinal needle. An 18 gauge trocar needle was advanced into the abscess/fluid collection and a short Amplatz super stiff wire was coiled within the collection. Appropriate positioning was confirmed with a limited CT scan. The tract was serially dilated allowing placement of a 10 [place] biliary drainage catheter. Appropriate positioning was confirmed with a limited postprocedural CT scan. Approximately 400 ml of translucent, straw-colored fluid was aspirated. The tube was connected to a bulb suction and sutured in place. A dressing was placed. The patient tolerated the procedure well without immediate post procedural complication. IMPRESSION: Successful CT guided placement of a 10 [place] biliary type drain catheter into the anterior abdominal wall fluid collection with aspiration of approximately 400 mL of translucent, straw-colored fluid. Samples were sent to the laboratory as requested by the ordering clinical  team. Marliss Coots, MD [facility] Radiology Electronically Signed   By: Marliss Coots M.D.   On: 05/12/2023 20:38   [place] EKG SITE RITE  Result Date: 05/10/2023 If Site Rite image not attached, placement could not be confirmed due to current cardiac rhythm.  CT ABDOMEN PELVIS W CONTRAST  Result Date: 05/09/2023 CLINICAL DATA:  Abdominal pain, Nausea, and bloating. Tenderness to palpation 1 month postop from ventral hernia repair. EXAM: CT ABDOMEN AND PELVIS WITH CONTRAST TECHNIQUE: Multidetector CT imaging of the abdomen and pelvis was performed using the standard protocol following bolus administration of intravenous contrast. RADIATION DOSE REDUCTION: This exam was performed according to the departmental dose-optimization program which includes automated exposure control, adjustment of the mA and/or kV according to patient size and/or use of iterative reconstruction technique. CONTRAST:  ISOVUE-370 IOPAMIDOL (ISOVUE-370) INJECTION 76% COMPARISON:  04/17/2023 FINDINGS: Lower Chest: No acute findings. Hepatobiliary: No suspicious hepatic masses identified. Gallbladder is unremarkable. No evidence of biliary ductal dilatation. Pancreas:  No mass or inflammatory changes. Spleen: Within normal limits in size and appearance. Adrenals/Urinary Tract: No suspicious masses identified. No evidence of ureteral calculi or hydronephrosis. Stomach/Bowel: No evidence of obstruction, inflammatory process or abnormal fluid collections. Normal appendix visualized. Vascular/Lymphatic: No pathologically enlarged lymph nodes. No acute vascular findings. Reproductive: Prior hysterectomy noted. Adnexal regions are unremarkable in appearance. Other: Prior ventral abdominal wall hernia repair and panniculectomy. A small midline epigastric ventral hernia is again seen, which contains only fat. Large rim enhancing fluid collection is seen throughout the anterior  abdominal wall  subcutaneous tissues. This currently measures 36.3 x 4.7 cm, mildly decreased in size since previous study. Musculoskeletal:  No suspicious bone lesions identified. IMPRESSION: Large rim enhancing fluid collection throughout the anterior abdominal wall subcutaneous tissues bilaterally. This has mildly decreased in size since previous study. Differential diagnosis includes postoperative seroma, lymphocele, and abscess. Stable small epigastric ventral hernia, which contains only fat. Electronically Signed   By: Danae Orleans M.D.   On: 05/09/2023 16:42    Labs:  CBC: Recent Labs    04/19/23 0331 04/20/23 0325 04/21/23 0250 05/10/23 1450  WBC 9.7 6.7 6.2 7.2  HGB 9.0* 8.2* 8.4* 11.2*  HCT 29.0* 26.7* 27.6* 35.7*  PLT 226 233 287 279    COAGS: Recent Labs    03/27/23 1323 04/17/23 1942 05/10/23 1450  INR 1.0 1.0 1.0  APTT 27 31  --     BMP: Recent Labs    04/19/23 0331 04/20/23 0325 04/21/23 0250 04/23/23 0333 05/10/23 1450 05/13/23 0434  NA 136 136 136 135  --   --   K 3.7 3.9 4.9 3.4*  --   --   CL 102 105 108 101  --   --   CO2 25 21* 20* 24  --   --   GLUCOSE 118* 89 81 94  --   --   BUN 24* 16 14 10   --   --   CALCIUM 7.6* 8.0* 8.3* 8.4*  --   --   CREATININE 1.58* 0.83 0.73 0.75 0.87 0.81  GFRNONAA 36* >60 >60 >60 >60 >60    LIVER FUNCTION TESTS: Recent Labs    04/17/23 1942 04/18/23 0343  BILITOT 1.1 1.0  AST 22 18  ALT 17 14  ALKPHOS 88 68  PROT 6.7 5.4*  ALBUMIN 3.5 2.8*    Assessment and Plan: Patient with history of abdominal wall seroma following panniculectomy and recent drain placement and removal who recently presented again with reaccumulation of seroma; status post anterior abdominal wall fluid collection drainage (10 [place] to JP bulb) on 9/22; afebrile, drain fluid culture negative to date; continue current treatment/drain irrigation/ close output monitoring; once output minimal arrange for follow-up CT   Electronically Signed: D. Jeananne Rama, PA-C 05/13/2023, 2:09 PM   I spent a total of 15 Minutes at the the patient's bedside AND on the patient's hospital floor or unit, greater than 50% of which was counseling/coordinating care for abdominal fluid collection drain    Patient ID: Jacqueline Mora, female   DOB: [birth year], 67 y.o.   MRN: [id number]

## 2023-05-13 NOTE — Discharge Summary (Signed)
[facility] Discharge Summary   Patient ID: Jacqueline Mora MRN: [id number] DOB/AGE: 1956/08/17 67 y.o.  Admit date: 05/10/2023 Discharge date: 05/13/2023  Admitting Diagnosis: Recurrent post-operative seroma vs abscess  Discharge Diagnosis Post-operative seroma   Consultants Interventional radiology   Imaging: CT GUIDED PERITONEAL/RETROPERITONEAL FLUID DRAIN BY PERC CATH  Result Date: 05/12/2023 INDICATION: 67 year old female with history of abdominal wall seroma after panniculectomy status post recent percutaneous drain placement and removal presenting now with reaccumulation of seroma. EXAM: CT IMAGE GUIDED DRAINAGE BY PERCUTANEOUS CATHETER COMPARISON:  None Available. MEDICATIONS: The patient is currently admitted to the hospital and receiving intravenous antibiotics. The antibiotics were administered within an appropriate time frame prior to the initiation of the procedure. ANESTHESIA/SEDATION: Moderate (conscious) sedation was employed during this procedure. A total of Versed 1 mg and Fentanyl 25 mcg was administered intravenously. Moderate Sedation Time: 15 minutes. The patient's level of consciousness and vital signs were monitored continuously by radiology nursing throughout the procedure under my direct supervision. CONTRAST:  None COMPLICATIONS: None immediate. PROCEDURE: RADIATION DOSE REDUCTION: This exam was performed according to the departmental dose-optimization program which includes automated exposure control, adjustment of the mA and/or kV according to patient size and/or use of iterative reconstruction technique. Informed written consent was obtained from the patient after a discussion of the risks, benefits and alternatives to treatment. The patient was placed supine on the CT gantry and a pre procedural CT was performed re-demonstrating the known abscess/fluid collection within the lower abdominal wall. The procedure was planned. A timeout was performed prior  to the initiation of the procedure. The right lower quadrant was prepped and draped in the usual sterile fashion. The overlying soft tissues were anesthetized with 1% lidocaine with epinephrine. Appropriate trajectory was planned with the use of a 22 gauge spinal needle. An 18 gauge trocar needle was advanced into the abscess/fluid collection and a short Amplatz super stiff wire was coiled within the collection. Appropriate positioning was confirmed with a limited CT scan. The tract was serially dilated allowing placement of a 10 [place] biliary drainage catheter. Appropriate positioning was confirmed with a limited postprocedural CT scan. Approximately 400 ml of translucent, straw-colored fluid was aspirated. The tube was connected to a bulb suction and sutured in place. A dressing was placed. The patient tolerated the procedure well without immediate post procedural complication. IMPRESSION: Successful CT guided placement of a 10 [place] biliary type drain catheter into the anterior abdominal wall fluid collection with aspiration of approximately 400 mL of translucent, straw-colored fluid. Samples were sent to the laboratory as requested by the ordering clinical team. Marliss Coots, MD [facility] Radiology Electronically Signed   By: Marliss Coots M.D.   On: 05/12/2023 20:38    Procedures Dr. Domingo Dimes Suttle (05/12/23) - CT guided abdominal wall drain placement  Hospital Course:  Patient is a 67 year old female s/p laparoscopic lysis of adhesions and repair of recurrent incisional hernia with panniculectomy on 04/04/2023 by Dr. Michaell Cowing. She developed postop seromas and drains were removed 2 weeks ago. She was seen in the office last week and noted to have increased pain along incision, CT ordered. CT showed recurrent fluid collections and patient instructed to come to the hospital.  Patient was admitted and underwent procedure listed above.  Tolerated procedure well  and was transferred to the floor.  Diet was advanced as tolerated.  On 05/13/23, the patient was voiding well, tolerating diet, ambulating well, pain well controlled, vital signs stable,  incisions c/d/i and felt stable for discharge home. She will follow up with IR and Dr. Michaell Cowing and is aware to call with questions or concerns. With no external signs of cellulitis, no fever, no leukocytosis and no organisms on culture to date, decision made to send home on PO antibiotics rather than place PICC line for IV antibiotics. Patient aware that plan may need to be adjusted if culture ends up growing anything.   I or a member of my team have reviewed this patient in the Controlled Substance Database.   Allergies as of 05/13/2023       Reactions   Amlodipine Besylate Other (See Comments)   Dizziness   Eszopiclone    Other reaction(s): Crazy dreams   Tape    Adhesive on medical tape and glue burns skin - requires paper tape.   Zocor [simvastatin]    myalgias   Clindamycin Hcl Rash   Meloxicam Rash   Methylprednisolone Rash   Penicillin G Rash   Tolerates cefalosporins        Medication List     STOP taking these medications    cefadroxil 500 MG/5ML suspension Commonly known as: DURICEF Replaced by: cefadroxil 1 g tablet       TAKE these medications    acetaminophen 325 MG tablet Commonly known as: TYLENOL Take 2 tablets (650 mg total) by mouth every 6 (six) hours as needed for mild pain (or temp > 100).   ALPRAZolam 0.5 MG tablet Commonly known as: XANAX Take 0.5 mg by mouth as needed for anxiety.   B-12 PO Take 1 tablet by mouth 4 (four) times a week.   cefadroxil 1 g tablet Commonly known as: DURICEF Take 1 tablet (1 g total) by mouth 2 (two) times daily for 7 days. Replaces: cefadroxil 500 MG/5ML suspension   cholecalciferol 1000 units tablet Commonly known as: VITAMIN D Take 1,000 Units by mouth 4 (four) times a week.   estradiol 1 MG tablet Commonly known as:  ESTRACE Take 1 mg by mouth daily.   ezetimibe 10 MG tablet Commonly known as: ZETIA Take 10 mg by mouth daily.   FLUoxetine 10 MG capsule Commonly known as: PROZAC Take 20 mg by mouth daily.   fluticasone 50 MCG/ACT nasal spray Commonly known as: FLONASE Place 1 spray into both nostrils daily as needed for allergies.   furosemide 40 MG tablet Commonly known as: LASIX TAKE 1 TABLET(40 MG TOTAL) BY MOUTH DAILY What changed: See the new instructions.   ketoconazole 2 % cream Commonly known as: NIZORAL Apply 1 Application topically daily.   levothyroxine 150 MCG tablet Commonly known as: SYNTHROID Take 150 mcg by mouth daily before breakfast.   losartan 100 MG tablet Commonly known as: COZAAR Take 100 mg by mouth daily. Holding per Dr. 05/10/2023   losartan 100 MG tablet Commonly known as: COZAAR Take 100 mg by mouth daily.   ondansetron 4 MG disintegrating tablet Commonly known as: ZOFRAN-ODT Take 1 tablet (4 mg total) by mouth every 6 (six) hours as needed for nausea.   oxyCODONE 5 MG immediate release tablet Commonly known as: Oxy IR/ROXICODONE Take 1 tablet (5 mg total) by mouth every 6 (six) hours as needed for severe pain. What changed:  medication strength how much to take reasons to take this   RABEprazole 20 MG tablet Commonly known as: ACIPHEX Take 20 mg by mouth daily.   sodium chloride flush 0.9 % Soln Commonly known as: NS 5 mLs by Intracatheter route every  8 (eight) hours.   spironolactone 25 MG tablet Commonly known as: ALDACTONE TAKE ONE TABLET BY MOUTH DAILY DOSE INCREASE What changed: See the new instructions.          Follow-up Information     Karie Soda, MD. Schedule an appointment as soon as possible for a visit.   Specialties: General Surgery, Colon and Rectal Surgery Why: We are working on getting follow up appointment set with Dr. Michaell Cowing, the office should call you. Contact information: [address] [place] [address] [phone]         Bennie Dallas, MD. Schedule an appointment as soon as possible for a visit in 14 day(s).   Specialties: Interventional Radiology, Diagnostic Radiology, Radiology Why: For drain injection study Contact information: [address] [place] [address] [phone]                 Signed: Juliet Rude , [facility] 05/13/2023, 2:45 PM Please see Amion for pager number during day hours 7:00am-4:30pm

## 2023-05-14 DIAGNOSIS — T8149XA Infection following a procedure, other surgical site, initial encounter: Secondary | ICD-10-CM | POA: Diagnosis not present

## 2023-05-14 DIAGNOSIS — F32A Depression, unspecified: Secondary | ICD-10-CM | POA: Diagnosis not present

## 2023-05-14 DIAGNOSIS — I069 Rheumatic aortic valve disease, unspecified: Secondary | ICD-10-CM | POA: Diagnosis not present

## 2023-05-14 DIAGNOSIS — N182 Chronic kidney disease, stage 2 (mild): Secondary | ICD-10-CM | POA: Diagnosis not present

## 2023-05-14 DIAGNOSIS — L02211 Cutaneous abscess of abdominal wall: Secondary | ICD-10-CM | POA: Diagnosis not present

## 2023-05-14 DIAGNOSIS — E039 Hypothyroidism, unspecified: Secondary | ICD-10-CM | POA: Diagnosis not present

## 2023-05-14 DIAGNOSIS — E7800 Pure hypercholesterolemia, unspecified: Secondary | ICD-10-CM | POA: Diagnosis not present

## 2023-05-14 DIAGNOSIS — K589 Irritable bowel syndrome without diarrhea: Secondary | ICD-10-CM | POA: Diagnosis not present

## 2023-05-14 DIAGNOSIS — F419 Anxiety disorder, unspecified: Secondary | ICD-10-CM | POA: Diagnosis not present

## 2023-05-14 DIAGNOSIS — I129 Hypertensive chronic kidney disease with stage 1 through stage 4 chronic kidney disease, or unspecified chronic kidney disease: Secondary | ICD-10-CM | POA: Diagnosis not present

## 2023-05-14 DIAGNOSIS — E559 Vitamin D deficiency, unspecified: Secondary | ICD-10-CM | POA: Diagnosis not present

## 2023-05-14 DIAGNOSIS — K432 Incisional hernia without obstruction or gangrene: Secondary | ICD-10-CM | POA: Diagnosis not present

## 2023-05-14 DIAGNOSIS — R7303 Prediabetes: Secondary | ICD-10-CM | POA: Diagnosis not present

## 2023-05-14 DIAGNOSIS — E876 Hypokalemia: Secondary | ICD-10-CM | POA: Diagnosis not present

## 2023-05-14 DIAGNOSIS — K219 Gastro-esophageal reflux disease without esophagitis: Secondary | ICD-10-CM | POA: Diagnosis not present

## 2023-05-16 LAB — AEROBIC/ANAEROBIC CULTURE W GRAM STAIN (SURGICAL/DEEP WOUND): Culture: NO GROWTH

## 2023-05-16 NOTE — H&P (Addendum)
05/16/2023     PROVIDER: Reine Just, PA  MRN: [id number] DOB: 1955-12-21 DATE OF ENCOUNTER: 05/08/2023 Interval History:  Jacqueline Mora is a 67 y.o. female who underwent laparoscopic lysis of adhesions and repair of recurrent incisional hernia with panniculectomy on 04/04/2023 by Dr. Michaell Cowing. Previously, she was found to have seromas on CT scan. Drained under ultrasound 04/18/2023. Culture grew MSSA. She was switched to oral Duricef. She was seen by Dr. Michaell Cowing on 04/30/2023 at which time she was doing well so drains were removed. She took her last dose of Duricef today. She called the office today, 05/07/2023, with concerns of pain, swelling, and firmness to the right abdomen with purulent drainage from the incision x 2 days. She states her abdomen feels bloated. She complains of decreased appetite but is trying to eat since she is taking antibiotics. She states she is drinking plenty of fluids. She denies constipation/diarrhea. She continues to hold losartan due to low blood pressure. She started taking oxycodone again due to pain.  Review of Systems:  ROS All other systems reviewed and are negative.  Medications:  Current Outpatient Medications on File Prior to Visit Medication Sig Dispense Refill ALPRAZolam (XANAX) 0.5 MG tablet TAKE ONE-HALF TO ONE TABLET BY MOUTH ONCE DAILY. GENERIC EQUIVALENT FOR XANAX. cholecalciferol (VITAMIN D3) 1000 unit tablet Take 2,000 Units by mouth estradioL (ESTRACE) 2 MG tablet Take 2 mg by mouth fluticasone propionate (FLONASE) 50 mcg/actuation nasal spray USE ONE SPRAY IN EACH NOSTRIL ONCE A DAY FUROsemide (LASIX) 40 MG tablet Take 40 mg by mouth once daily hydroCHLOROthiazide (HYDRODIURIL) 25 MG tablet Take 12.5 mg by mouth ibuprofen (MOTRIN) 800 MG tablet Take 800 mg by mouth every 6 (six) hours as needed ketoconazole (NIZORAL) 2 % cream APPLY TOPICALLY DAILY AS NEEDED FOR FLARES levothyroxine (SYNTHROID) 200 MCG tablet Take by mouth losartan (COZAAR)  100 MG tablet Take 100 mg by mouth once daily meloxicam (MOBIC) 15 MG tablet Take 1 tablet by mouth once daily oxyCODONE (ROXICODONE) 5 MG immediate release tablet Take 1 tablet (5 mg total) by mouth every 6 (six) hours as needed for Pain 20 tablet 0  No current facility-administered medications on file prior to visit.  Physical Examination:  BP (!) 136/93  Pulse 96  Temp 36.2 C (97.1 F)  SpO2 99% General: Well-developed, well-nourished, in no acute distress. Abdomen: Panniculectomy incision has a 3 cm superficial opening in the central portion of the incision on the mid lower abdomen. There is no deeper opening or active drainage but there is some yellow drainage on the gauze that was removed. There is no overlying erythema but she is quite tender to palpation on the right lower abdomen along the incision. There is a 2 cm x 1 cm area of faint erythema on the left lateral incision. No fluctuance or drainage  Assessment and Plan:  Diagnoses and all orders for this visit:  S/P hernia repair - CT abdomen pelvis with contrast; Future  Other orders - oxyCODONE (ROXICODONE) 5 MG immediate release tablet; Take 1 tablet (5 mg total) by mouth every 4 (four) hours as needed for Pain - cefadroxil (DURICEF) 500 mg/5 mL suspension; Take 10 mLs (1,000 mg total) by mouth 2 (two) times daily for 10 days   Jacqueline Mora is status post laparoscopic lysis of adhesions and repair of recurrent incisional hernia with panniculectomy on 04/04/2023 by Dr. Michaell Cowing. She developed postop seromas and drains were removed last week. On exam today, she is quite tender on the right  lower abdomen along the panniculectomy incision. There is no obvious external signs of infection. Given her level of discomfort, we ordered CT abdomen pelvis with IV and oral contrast. If CT shows fluid collections, she may need drains placed again. She was advised to go to the ER if she develops any worsening issues before outpatient workup can  be completed. She provided understanding.  No follow-ups on file.  Saunders Glance, [facility]   ADDENDUM:   CT scan notes recurrent fluid collections.  Given her worsening discomfort we will admit and placed on IV antibiotics and repeat drains to rule out recurrent infected seromas or infected mesh.  My instinct is she will need a PICC line and 6 weeks of IV antibiotics depending on culture findings.  Hopefully try and salvage and prevent the need for mesh removal.  We will see.

## 2023-05-21 ENCOUNTER — Other Ambulatory Visit: Payer: Self-pay | Admitting: Surgery

## 2023-05-21 ENCOUNTER — Ambulatory Visit: Payer: Medicare Other

## 2023-05-21 DIAGNOSIS — K651 Peritoneal abscess: Secondary | ICD-10-CM

## 2023-05-22 DIAGNOSIS — K589 Irritable bowel syndrome without diarrhea: Secondary | ICD-10-CM | POA: Diagnosis not present

## 2023-05-22 DIAGNOSIS — N182 Chronic kidney disease, stage 2 (mild): Secondary | ICD-10-CM | POA: Diagnosis not present

## 2023-05-22 DIAGNOSIS — I069 Rheumatic aortic valve disease, unspecified: Secondary | ICD-10-CM | POA: Diagnosis not present

## 2023-05-22 DIAGNOSIS — E7800 Pure hypercholesterolemia, unspecified: Secondary | ICD-10-CM | POA: Diagnosis not present

## 2023-05-22 DIAGNOSIS — L02211 Cutaneous abscess of abdominal wall: Secondary | ICD-10-CM | POA: Diagnosis not present

## 2023-05-22 DIAGNOSIS — E039 Hypothyroidism, unspecified: Secondary | ICD-10-CM | POA: Diagnosis not present

## 2023-05-22 DIAGNOSIS — R7303 Prediabetes: Secondary | ICD-10-CM | POA: Diagnosis not present

## 2023-05-22 DIAGNOSIS — K219 Gastro-esophageal reflux disease without esophagitis: Secondary | ICD-10-CM | POA: Diagnosis not present

## 2023-05-22 DIAGNOSIS — I129 Hypertensive chronic kidney disease with stage 1 through stage 4 chronic kidney disease, or unspecified chronic kidney disease: Secondary | ICD-10-CM | POA: Diagnosis not present

## 2023-05-22 DIAGNOSIS — K432 Incisional hernia without obstruction or gangrene: Secondary | ICD-10-CM | POA: Diagnosis not present

## 2023-05-22 DIAGNOSIS — F419 Anxiety disorder, unspecified: Secondary | ICD-10-CM | POA: Diagnosis not present

## 2023-05-22 DIAGNOSIS — F32A Depression, unspecified: Secondary | ICD-10-CM | POA: Diagnosis not present

## 2023-05-22 DIAGNOSIS — T8149XA Infection following a procedure, other surgical site, initial encounter: Secondary | ICD-10-CM | POA: Diagnosis not present

## 2023-05-22 DIAGNOSIS — E559 Vitamin D deficiency, unspecified: Secondary | ICD-10-CM | POA: Diagnosis not present

## 2023-05-22 DIAGNOSIS — E876 Hypokalemia: Secondary | ICD-10-CM | POA: Diagnosis not present

## 2023-05-23 ENCOUNTER — Ambulatory Visit
Admission: RE | Admit: 2023-05-23 | Discharge: 2023-05-23 | Disposition: A | Discharge status: Home / Self Care | Payer: Medicare Other | Source: Ambulatory Visit | Attending: Surgery | Admitting: Surgery

## 2023-05-23 DIAGNOSIS — L02211 Cutaneous abscess of abdominal wall: Secondary | ICD-10-CM | POA: Diagnosis not present

## 2023-05-23 DIAGNOSIS — K651 Peritoneal abscess: Secondary | ICD-10-CM

## 2023-05-23 DIAGNOSIS — L7634 Postprocedural seroma of skin and subcutaneous tissue following other procedure: Secondary | ICD-10-CM | POA: Diagnosis not present

## 2023-05-23 HISTORY — PX: IR RADIOLOGIST EVAL & MGMT: IMG5224

## 2023-05-23 MED ORDER — IOPAMIDOL (ISOVUE-300) INJECTION 61%
100.0000 mL | Freq: Once | INTRAVENOUS | Status: AC | PRN
  Administered 2023-05-23: 100 mL via INTRAVENOUS

## 2023-05-23 NOTE — Progress Notes (Signed)
Chief Complaint: Percutaneous drain for ant abd wall seroma  Referring Physician(s): Gross,Steven  History of Present Illness: Jacqueline Mora is a 67 y.o. female presenting as scheduled outpatient to [facility] clinic.   She is SP laparoscopic mesh repair of ventral incisional reoccurring and incarcerated hernia, with panniculectomy, 04/04/23.   She had post surgical fluid on CT 8/29 drained by 2 separate percutaneous drains. These were removed in clinic, after treatment of staph aureus infection.  She returned with recurrent seroma that was present on CT 05/09/23, drained 9/21.    The culture on the second drainage was sterile.   Today she tells me that the output has been decreased, this am was about 20ml removed from the bulb.  The material is thin yellow output.  No bloody output.  She has some mild tenderness, but denies fevers, rigors, chills. She has completed the ABX as of yesterday. She continues to flush the drain every 3 days with sterile saline flush.   Ct was performed today which I independently reviewed.   This shows significant residual. Drain in good location in the fluid.   I reviewed previous cultures and most recent CBC/BMP, as well as surgical notes and notes from the surgery clinic via care everywhere.   Past Medical History:  Diagnosis Date   Abdominal hernia    Anemia    Anxiety    BMI 45.0-49.9, adult (HCC)    Cancer (HCC)    Colon cancer (HCC)    COVID-19    Depression    GERD (gastroesophageal reflux disease)    History of colon polyps    Hypercholesteremia    Hypertension    occ   Hypothyroidism    IBS (irritable bowel syndrome)    Mitral valve problem    Petechiae 04/04/2023   red patches after ankle   PONV (postoperative nausea and vomiting)    Prediabetes    Squamous cell carcinoma in situ (SCCIS) of skin    Thyroid disease    Vitamin D deficiency     Past Surgical History:  Procedure Laterality Date   ABDOMINAL HYSTERECTOMY      BLADDER NECK SUSPENSION     CESAREAN SECTION     x 2   colon cancer     COLONOSCOPY     HERNIA REPAIR     Mesh placed then later removed due to infection.   laparostomy     VENTRAL HERNIA REPAIR N/A 04/04/2023   Procedure: LAPAROSCOPIC VENTRAL WALL HERNIA WITH LYSIS OF ADHESIONS AND PANNICULECTOMY;  Surgeon: Karie Soda, MD;  Location: [facility];  Service: General;  Laterality: N/A;    Allergies: Amlodipine besylate, Eszopiclone, Tape, Zocor [simvastatin], Clindamycin hcl, Meloxicam, Methylprednisolone, and Penicillin g  Medications: Prior to Admission medications   Medication Sig Start Date End Date Taking? Authorizing Provider  acetaminophen (TYLENOL) 325 MG tablet Take 2 tablets (650 mg total) by mouth every 6 (six) hours as needed for mild pain (or temp > 100). 05/13/23   Juliet Rude, PA-C  ALPRAZolam Prudy Feeler) 0.5 MG tablet Take 0.5 mg by mouth as needed for anxiety.    [provider]  cholecalciferol (VITAMIN D) 1000 units tablet Take 1,000 Units by mouth 4 (four) times a week.    [provider]  Cyanocobalamin (B-12 PO) Take 1 tablet by mouth 4 (four) times a week.    [provider]  estradiol (ESTRACE) 1 MG tablet Take 1 mg by mouth daily.    [provider]  ezetimibe (ZETIA) 10 MG tablet Take 10 mg by mouth daily. 02/25/23   [provider]  FLUoxetine (PROZAC) 10 MG capsule Take 20 mg by mouth daily.    [provider]  fluticasone (FLONASE) 50 MCG/ACT nasal spray Place 1 spray into both nostrils daily as needed for allergies. 12/12/16   [provider]  furosemide (LASIX) 40 MG tablet TAKE 1 TABLET(40 MG TOTAL) BY MOUTH DAILY Patient taking differently: Take 40 mg by mouth daily. 07/09/22   Chandrasekhar, Rondel Jumbo, MD  ketoconazole (NIZORAL) 2 % cream Apply 1 Application topically daily. 08/22/21   [provider]  levothyroxine (SYNTHROID) 150 MCG tablet Take 150 mcg by mouth daily before breakfast.     [provider]  losartan (COZAAR) 100 MG tablet Take 100 mg by mouth daily. Holding per Dr. 05/10/2023    [provider]  losartan (COZAAR) 100 MG tablet Take 100 mg by mouth daily.    [provider]  ondansetron (ZOFRAN-ODT) 4 MG disintegrating tablet Take 1 tablet (4 mg total) by mouth every 6 (six) hours as needed for nausea. 04/23/23   Karie Soda, MD  oxyCODONE (OXY IR/ROXICODONE) 5 MG immediate release tablet Take 1 tablet (5 mg total) by mouth every 6 (six) hours as needed for severe pain. 05/13/23   Juliet Rude, PA-C  RABEprazole (ACIPHEX) 20 MG tablet Take 20 mg by mouth daily.    [provider]  sodium chloride flush (NS) 0.9 % SOLN 5 mLs by Intracatheter route every 8 (eight) hours. 05/13/23   Juliet Rude, PA-C  spironolactone (ALDACTONE) 25 MG tablet TAKE ONE TABLET BY MOUTH DAILY DOSE INCREASE Patient taking differently: Take 25 mg by mouth once. 10/08/22   Christell Constant, MD     Family History  Problem Relation Age of Onset   Kidney cancer Mother    Diabetes Father     Social History   Socioeconomic History   Marital status: Widowed    Spouse name: Not on file   Number of children: 2   Years of education: 4 yrs college   Highest education level: Not on file  Occupational History   Occupation: Retired  Tobacco Use   Smoking status: Former    Types: Cigarettes   Smokeless tobacco: Never   Tobacco comments:    Quit 1984  Vaping Use   Vaping status: Never Used  Substance and Sexual Activity   Alcohol use: Not Currently    Comment: occasionally   Drug use: No   Sexual activity: Not on file  Other Topics Concern   Not on file  Social History Narrative   Lives at home with husband.   Right-handed.   4-5 cups unsweet tea per week.   Social Determinants of Health   Financial Resource Strain: Not on file  Food Insecurity: Patient Declined (05/10/2023)   Hunger Vital Sign    Worried About Running Out of Food  in the Last Year: Patient declined    Ran Out of Food in the Last Year: Patient declined  Transportation Needs: Patient Declined (05/10/2023)   PRAPARE - Administrator, Civil Service (Medical): Patient declined    Lack of Transportation (Non-Medical): Patient declined  Physical Activity: Not on file  Stress: Not on file  Social Connections: Not on file     Review of Systems: A 12 point ROS discussed and pertinent positives are indicated in the HPI above.  All other systems are negative.  Review of  Systems  Vital Signs: There were no vitals taken for this visit.     Physical Exam General: 67 yo female appearing stated age.  Well-developed, well-nourished.  No distress. HEENT: Atraumatic, normocephalic.  Conjugate gaze, extra-ocular motor intact. No scleral icterus or scleral injection. No lesions on external ears, nose, lips, or gums.  Oral mucosa moist, pink.  Chest/Lungs:  Symmetric chest with inspiration/expiration.  No labored breathing.    Heart:   No JVD appreciated.  Abdomen:  Obese.  Anterior percutaneous drain in place.  Surgical dressing at panniculectomy site. Genito-urinary: Deferred Neurologic: Alert & Oriented to person, place, and time.   Normal affect and insight.   Skin: Drain site clean, dry, intact.  The drain flushes, with scant material returned based on the residual on the CT.  Some solid material returned on aspiration. No sign of infection  Mallampati Score:     Imaging: CT GUIDED PERITONEAL/RETROPERITONEAL FLUID DRAIN BY PERC CATH  Result Date: 05/12/2023 INDICATION: 67 year old female with history of abdominal wall seroma after panniculectomy status post recent percutaneous drain placement and removal presenting now with reaccumulation of seroma. EXAM: CT IMAGE GUIDED DRAINAGE BY PERCUTANEOUS CATHETER COMPARISON:  None Available. MEDICATIONS: The patient is currently admitted to the hospital and receiving intravenous antibiotics. The  antibiotics were administered within an appropriate time frame prior to the initiation of the procedure. ANESTHESIA/SEDATION: Moderate (conscious) sedation was employed during this procedure. A total of Versed 1 mg and Fentanyl 25 mcg was administered intravenously. Moderate Sedation Time: 15 minutes. The patient's level of consciousness and vital signs were monitored continuously by radiology nursing throughout the procedure under my direct supervision. CONTRAST:  None COMPLICATIONS: None immediate. PROCEDURE: RADIATION DOSE REDUCTION: This exam was performed according to the departmental dose-optimization program which includes automated exposure control, adjustment of the mA and/or kV according to patient size and/or use of iterative reconstruction technique. Informed written consent was obtained from the patient after a discussion of the risks, benefits and alternatives to treatment. The patient was placed supine on the CT gantry and a pre procedural CT was performed re-demonstrating the known abscess/fluid collection within the lower abdominal wall. The procedure was planned. A timeout was performed prior to the initiation of the procedure. The right lower quadrant was prepped and draped in the usual sterile fashion. The overlying soft tissues were anesthetized with 1% lidocaine with epinephrine. Appropriate trajectory was planned with the use of a 22 gauge spinal needle. An 18 gauge trocar needle was advanced into the abscess/fluid collection and a short Amplatz super stiff wire was coiled within the collection. Appropriate positioning was confirmed with a limited CT scan. The tract was serially dilated allowing placement of a 10 [place] biliary drainage catheter. Appropriate positioning was confirmed with a limited postprocedural CT scan. Approximately 400 ml of translucent, straw-colored fluid was aspirated. The tube was connected to a bulb suction and sutured in place. A dressing was placed. The patient  tolerated the procedure well without immediate post procedural complication. IMPRESSION: Successful CT guided placement of a 10 [place] biliary type drain catheter into the anterior abdominal wall fluid collection with aspiration of approximately 400 mL of translucent, straw-colored fluid. Samples were sent to the laboratory as requested by the ordering clinical team. Marliss Coots, MD [facility] Electronically Signed   By: Marliss Coots M.D.   On: 05/12/2023 20:38   [place] EKG SITE RITE  Result Date: 05/10/2023 If Site Rite image not attached, placement could not be  confirmed due to current cardiac rhythm.  CT ABDOMEN PELVIS W CONTRAST  Result Date: 05/09/2023 CLINICAL DATA:  Abdominal pain, Nausea, and bloating. Tenderness to palpation 1 month postop from ventral hernia repair. EXAM: CT ABDOMEN AND PELVIS WITH CONTRAST TECHNIQUE: Multidetector CT imaging of the abdomen and pelvis was performed using the standard protocol following bolus administration of intravenous contrast. RADIATION DOSE REDUCTION: This exam was performed according to the departmental dose-optimization program which includes automated exposure control, adjustment of the mA and/or kV according to patient size and/or use of iterative reconstruction technique. CONTRAST:  ISOVUE-370 IOPAMIDOL (ISOVUE-370) INJECTION 76% COMPARISON:  04/17/2023 FINDINGS: Lower Chest: No acute findings. Hepatobiliary: No suspicious hepatic masses identified. Gallbladder is unremarkable. No evidence of biliary ductal dilatation. Pancreas:  No mass or inflammatory changes. Spleen: Within normal limits in size and appearance. Adrenals/Urinary Tract: No suspicious masses identified. No evidence of ureteral calculi or hydronephrosis. Stomach/Bowel: No evidence of obstruction, inflammatory process or abnormal fluid collections. Normal appendix visualized. Vascular/Lymphatic: No pathologically enlarged lymph  nodes. No acute vascular findings. Reproductive: Prior hysterectomy noted. Adnexal regions are unremarkable in appearance. Other: Prior ventral abdominal wall hernia repair and panniculectomy. A small midline epigastric ventral hernia is again seen, which contains only fat. Large rim enhancing fluid collection is seen throughout the anterior abdominal wall subcutaneous tissues. This currently measures 36.3 x 4.7 cm, mildly decreased in size since previous study. Musculoskeletal:  No suspicious bone lesions identified. IMPRESSION: Large rim enhancing fluid collection throughout the anterior abdominal wall subcutaneous tissues bilaterally. This has mildly decreased in size since previous study. Differential diagnosis includes postoperative seroma, lymphocele, and abscess. Stable small epigastric ventral hernia, which contains only fat. Electronically Signed   By: Danae Orleans M.D.   On: 05/09/2023 16:42    Labs:  CBC: Recent Labs    04/19/23 0331 04/20/23 0325 04/21/23 0250 05/10/23 1450  WBC 9.7 6.7 6.2 7.2  HGB 9.0* 8.2* 8.4* 11.2*  HCT 29.0* 26.7* 27.6* 35.7*  PLT 226 233 287 279    COAGS: Recent Labs    03/27/23 1323 04/17/23 1942 05/10/23 1450  INR 1.0 1.0 1.0  APTT 27 31  --     BMP: Recent Labs    04/19/23 0331 04/20/23 0325 04/21/23 0250 04/23/23 0333 05/10/23 1450 05/13/23 0434  NA 136 136 136 135  --   --   K 3.7 3.9 4.9 3.4*  --   --   CL 102 105 108 101  --   --   CO2 25 21* 20* 24  --   --   GLUCOSE 118* 89 81 94  --   --   BUN 24* 16 14 10   --   --   CALCIUM 7.6* 8.0* 8.3* 8.4*  --   --   CREATININE 1.58* 0.83 0.73 0.75 0.87 0.81  GFRNONAA 36* >60 >60 >60 >60 >60    LIVER FUNCTION TESTS: Recent Labs    04/17/23 1942 04/18/23 0343  BILITOT 1.1 1.0  AST 22 18  ALT 17 14  ALKPHOS 88 68  PROT 6.7 5.4*  ALBUMIN 3.5 2.8*    TUMOR MARKERS: No results for input(s): "AFPTM", "CEA", "CA199", "CHROMGRNA" in the last 8760 hours.  Assessment and  Plan:  Ms Ebron is a 67 yo female with seroma of anterior low abdomen wall/soft tissues, with seroma drain placed 05/12/23.    The CT performed today shows significant residual fluid in the seroma, and manual flushing and aspirating today yielded no significant output,  as well as some debris that I think is at least partially blocking the drain output.   I advised her that she should continue managing the drain as she has been, add some compression such as a lumbar support or wrap, use ice for any soreness/discomfort.  I also advised that we should do an image guided drain exchange and upsize, as I think hers is blocked.   She understands.    Plan: Continue drain care.  Follow up with surgery. We will plan for image guided drain exchange and upsize in the near future at the hospital -- she prefers [facility].  Set up for future drain clinic eval with repeat CT.   I spent approximately 25 minutes today reviewing medical records, getting history from the patient, reviewing tests/the completed CT dated today, ordering the future drain exchange, documenting, and communicating with the patient.    Electronically Signed: Gilmer Mor 05/23/2023, 11:13 AM   I spent a total of    25 Minutes in face to face in clinical consultation, greater than 50% of which was counseling/coordinating care for seroma drain, management

## 2023-05-25 DIAGNOSIS — T8149XA Infection following a procedure, other surgical site, initial encounter: Secondary | ICD-10-CM | POA: Diagnosis not present

## 2023-05-25 DIAGNOSIS — E559 Vitamin D deficiency, unspecified: Secondary | ICD-10-CM | POA: Diagnosis not present

## 2023-05-25 DIAGNOSIS — K589 Irritable bowel syndrome without diarrhea: Secondary | ICD-10-CM | POA: Diagnosis not present

## 2023-05-25 DIAGNOSIS — R7303 Prediabetes: Secondary | ICD-10-CM | POA: Diagnosis not present

## 2023-05-25 DIAGNOSIS — Z90710 Acquired absence of both cervix and uterus: Secondary | ICD-10-CM | POA: Diagnosis not present

## 2023-05-25 DIAGNOSIS — N182 Chronic kidney disease, stage 2 (mild): Secondary | ICD-10-CM | POA: Diagnosis not present

## 2023-05-25 DIAGNOSIS — Z85038 Personal history of other malignant neoplasm of large intestine: Secondary | ICD-10-CM | POA: Diagnosis not present

## 2023-05-25 DIAGNOSIS — Z87891 Personal history of nicotine dependence: Secondary | ICD-10-CM | POA: Diagnosis not present

## 2023-05-25 DIAGNOSIS — F419 Anxiety disorder, unspecified: Secondary | ICD-10-CM | POA: Diagnosis not present

## 2023-05-25 DIAGNOSIS — I069 Rheumatic aortic valve disease, unspecified: Secondary | ICD-10-CM | POA: Diagnosis not present

## 2023-05-25 DIAGNOSIS — E7800 Pure hypercholesterolemia, unspecified: Secondary | ICD-10-CM | POA: Diagnosis not present

## 2023-05-25 DIAGNOSIS — A419 Sepsis, unspecified organism: Secondary | ICD-10-CM | POA: Diagnosis not present

## 2023-05-25 DIAGNOSIS — Z6841 Body Mass Index (BMI) 40.0 and over, adult: Secondary | ICD-10-CM | POA: Diagnosis not present

## 2023-05-25 DIAGNOSIS — I129 Hypertensive chronic kidney disease with stage 1 through stage 4 chronic kidney disease, or unspecified chronic kidney disease: Secondary | ICD-10-CM | POA: Diagnosis not present

## 2023-05-25 DIAGNOSIS — K649 Unspecified hemorrhoids: Secondary | ICD-10-CM | POA: Diagnosis not present

## 2023-05-25 DIAGNOSIS — Z85528 Personal history of other malignant neoplasm of kidney: Secondary | ICD-10-CM | POA: Diagnosis not present

## 2023-05-25 DIAGNOSIS — Z860100 Personal history of colon polyps, unspecified: Secondary | ICD-10-CM | POA: Diagnosis not present

## 2023-05-25 DIAGNOSIS — F32A Depression, unspecified: Secondary | ICD-10-CM | POA: Diagnosis not present

## 2023-05-25 DIAGNOSIS — L02211 Cutaneous abscess of abdominal wall: Secondary | ICD-10-CM | POA: Diagnosis not present

## 2023-05-25 DIAGNOSIS — K219 Gastro-esophageal reflux disease without esophagitis: Secondary | ICD-10-CM | POA: Diagnosis not present

## 2023-05-25 DIAGNOSIS — D631 Anemia in chronic kidney disease: Secondary | ICD-10-CM | POA: Diagnosis not present

## 2023-05-25 DIAGNOSIS — E039 Hypothyroidism, unspecified: Secondary | ICD-10-CM | POA: Diagnosis not present

## 2023-05-25 DIAGNOSIS — E876 Hypokalemia: Secondary | ICD-10-CM | POA: Diagnosis not present

## 2023-05-27 ENCOUNTER — Other Ambulatory Visit (HOSPITAL_COMMUNITY): Payer: Self-pay | Admitting: Interventional Radiology

## 2023-05-27 DIAGNOSIS — L02211 Cutaneous abscess of abdominal wall: Secondary | ICD-10-CM

## 2023-05-28 ENCOUNTER — Other Ambulatory Visit: Payer: Self-pay | Admitting: Radiology

## 2023-05-28 ENCOUNTER — Ambulatory Visit (HOSPITAL_COMMUNITY)
Admission: RE | Admit: 2023-05-28 | Discharge: 2023-05-28 | Disposition: A | Discharge status: Home / Self Care | Payer: Medicare Other | Source: Ambulatory Visit | Attending: Interventional Radiology | Admitting: Interventional Radiology

## 2023-05-28 DIAGNOSIS — L02211 Cutaneous abscess of abdominal wall: Secondary | ICD-10-CM

## 2023-05-28 DIAGNOSIS — L7634 Postprocedural seroma of skin and subcutaneous tissue following other procedure: Secondary | ICD-10-CM | POA: Diagnosis not present

## 2023-05-28 DIAGNOSIS — S301XXS Contusion of abdominal wall, sequela: Secondary | ICD-10-CM

## 2023-05-28 HISTORY — PX: IR CATHETER TUBE CHANGE: IMG717

## 2023-05-28 MED ORDER — IOHEXOL 300 MG/ML  SOLN
50.0000 mL | Freq: Once | INTRAMUSCULAR | Status: AC | PRN
  Administered 2023-05-28: 10 mL

## 2023-05-28 MED ORDER — LIDOCAINE-EPINEPHRINE 1 %-1:100000 IJ SOLN
INTRAMUSCULAR | Status: AC
  Filled 2023-05-28: qty 1

## 2023-05-28 MED ORDER — LIDOCAINE-EPINEPHRINE 1 %-1:100000 IJ SOLN
20.0000 mL | Freq: Once | INTRAMUSCULAR | Status: AC
  Administered 2023-05-28: 2 mL via INTRADERMAL

## 2023-05-29 ENCOUNTER — Ambulatory Visit: Payer: Medicare Other | Admitting: Podiatry

## 2023-05-30 DIAGNOSIS — E039 Hypothyroidism, unspecified: Secondary | ICD-10-CM | POA: Diagnosis not present

## 2023-05-30 DIAGNOSIS — R7303 Prediabetes: Secondary | ICD-10-CM | POA: Diagnosis not present

## 2023-05-30 DIAGNOSIS — T8149XA Infection following a procedure, other surgical site, initial encounter: Secondary | ICD-10-CM | POA: Diagnosis not present

## 2023-05-30 DIAGNOSIS — D631 Anemia in chronic kidney disease: Secondary | ICD-10-CM | POA: Diagnosis not present

## 2023-05-30 DIAGNOSIS — Z6841 Body Mass Index (BMI) 40.0 and over, adult: Secondary | ICD-10-CM | POA: Diagnosis not present

## 2023-05-30 DIAGNOSIS — F32A Depression, unspecified: Secondary | ICD-10-CM | POA: Diagnosis not present

## 2023-05-30 DIAGNOSIS — N182 Chronic kidney disease, stage 2 (mild): Secondary | ICD-10-CM | POA: Diagnosis not present

## 2023-05-30 DIAGNOSIS — A419 Sepsis, unspecified organism: Secondary | ICD-10-CM | POA: Diagnosis not present

## 2023-05-30 DIAGNOSIS — F419 Anxiety disorder, unspecified: Secondary | ICD-10-CM | POA: Diagnosis not present

## 2023-05-30 DIAGNOSIS — L02211 Cutaneous abscess of abdominal wall: Secondary | ICD-10-CM | POA: Diagnosis not present

## 2023-05-30 DIAGNOSIS — E7800 Pure hypercholesterolemia, unspecified: Secondary | ICD-10-CM | POA: Diagnosis not present

## 2023-05-30 DIAGNOSIS — K649 Unspecified hemorrhoids: Secondary | ICD-10-CM | POA: Diagnosis not present

## 2023-05-30 DIAGNOSIS — I069 Rheumatic aortic valve disease, unspecified: Secondary | ICD-10-CM | POA: Diagnosis not present

## 2023-05-30 DIAGNOSIS — E876 Hypokalemia: Secondary | ICD-10-CM | POA: Diagnosis not present

## 2023-05-30 DIAGNOSIS — I129 Hypertensive chronic kidney disease with stage 1 through stage 4 chronic kidney disease, or unspecified chronic kidney disease: Secondary | ICD-10-CM | POA: Diagnosis not present

## 2023-06-06 DIAGNOSIS — L02211 Cutaneous abscess of abdominal wall: Secondary | ICD-10-CM | POA: Diagnosis not present

## 2023-06-06 DIAGNOSIS — R7303 Prediabetes: Secondary | ICD-10-CM | POA: Diagnosis not present

## 2023-06-06 DIAGNOSIS — N182 Chronic kidney disease, stage 2 (mild): Secondary | ICD-10-CM | POA: Diagnosis not present

## 2023-06-06 DIAGNOSIS — T8149XA Infection following a procedure, other surgical site, initial encounter: Secondary | ICD-10-CM | POA: Diagnosis not present

## 2023-06-06 DIAGNOSIS — D631 Anemia in chronic kidney disease: Secondary | ICD-10-CM | POA: Diagnosis not present

## 2023-06-06 DIAGNOSIS — F32A Depression, unspecified: Secondary | ICD-10-CM | POA: Diagnosis not present

## 2023-06-06 DIAGNOSIS — F419 Anxiety disorder, unspecified: Secondary | ICD-10-CM | POA: Diagnosis not present

## 2023-06-06 DIAGNOSIS — E7800 Pure hypercholesterolemia, unspecified: Secondary | ICD-10-CM | POA: Diagnosis not present

## 2023-06-06 DIAGNOSIS — I129 Hypertensive chronic kidney disease with stage 1 through stage 4 chronic kidney disease, or unspecified chronic kidney disease: Secondary | ICD-10-CM | POA: Diagnosis not present

## 2023-06-06 DIAGNOSIS — A419 Sepsis, unspecified organism: Secondary | ICD-10-CM | POA: Diagnosis not present

## 2023-06-06 DIAGNOSIS — K649 Unspecified hemorrhoids: Secondary | ICD-10-CM | POA: Diagnosis not present

## 2023-06-06 DIAGNOSIS — E876 Hypokalemia: Secondary | ICD-10-CM | POA: Diagnosis not present

## 2023-06-06 DIAGNOSIS — I069 Rheumatic aortic valve disease, unspecified: Secondary | ICD-10-CM | POA: Diagnosis not present

## 2023-06-06 DIAGNOSIS — Z6841 Body Mass Index (BMI) 40.0 and over, adult: Secondary | ICD-10-CM | POA: Diagnosis not present

## 2023-06-06 DIAGNOSIS — E039 Hypothyroidism, unspecified: Secondary | ICD-10-CM | POA: Diagnosis not present

## 2023-06-07 ENCOUNTER — Ambulatory Visit (HOSPITAL_COMMUNITY): Payer: Medicare Other

## 2023-06-11 DIAGNOSIS — I700 Atherosclerosis of aorta: Secondary | ICD-10-CM | POA: Diagnosis not present

## 2023-06-12 DIAGNOSIS — R7303 Prediabetes: Secondary | ICD-10-CM | POA: Diagnosis not present

## 2023-06-12 DIAGNOSIS — F419 Anxiety disorder, unspecified: Secondary | ICD-10-CM | POA: Diagnosis not present

## 2023-06-12 DIAGNOSIS — N182 Chronic kidney disease, stage 2 (mild): Secondary | ICD-10-CM | POA: Diagnosis not present

## 2023-06-12 DIAGNOSIS — Z6841 Body Mass Index (BMI) 40.0 and over, adult: Secondary | ICD-10-CM | POA: Diagnosis not present

## 2023-06-12 DIAGNOSIS — D631 Anemia in chronic kidney disease: Secondary | ICD-10-CM | POA: Diagnosis not present

## 2023-06-12 DIAGNOSIS — I129 Hypertensive chronic kidney disease with stage 1 through stage 4 chronic kidney disease, or unspecified chronic kidney disease: Secondary | ICD-10-CM | POA: Diagnosis not present

## 2023-06-12 DIAGNOSIS — T8149XA Infection following a procedure, other surgical site, initial encounter: Secondary | ICD-10-CM | POA: Diagnosis not present

## 2023-06-12 DIAGNOSIS — E7800 Pure hypercholesterolemia, unspecified: Secondary | ICD-10-CM | POA: Diagnosis not present

## 2023-06-12 DIAGNOSIS — K649 Unspecified hemorrhoids: Secondary | ICD-10-CM | POA: Diagnosis not present

## 2023-06-12 DIAGNOSIS — F32A Depression, unspecified: Secondary | ICD-10-CM | POA: Diagnosis not present

## 2023-06-12 DIAGNOSIS — I069 Rheumatic aortic valve disease, unspecified: Secondary | ICD-10-CM | POA: Diagnosis not present

## 2023-06-12 DIAGNOSIS — L02211 Cutaneous abscess of abdominal wall: Secondary | ICD-10-CM | POA: Diagnosis not present

## 2023-06-12 DIAGNOSIS — A419 Sepsis, unspecified organism: Secondary | ICD-10-CM | POA: Diagnosis not present

## 2023-06-12 DIAGNOSIS — E039 Hypothyroidism, unspecified: Secondary | ICD-10-CM | POA: Diagnosis not present

## 2023-06-12 DIAGNOSIS — E876 Hypokalemia: Secondary | ICD-10-CM | POA: Diagnosis not present

## 2023-06-13 NOTE — Progress Notes (Signed)
Chief Complaint: Percutaneous drain for anterior abdominal wall seroma   Referring Physician(s): Gross,Steven MD   History of Present Illness:  Jacqueline Mora is a 67 y.o. female who represents [facility] in follow up. Pt was last seen in clinic on 05/23/23 by my colleague, Dr Loreta Ave, where he was noted to have mininal drainage from her catheter. She was recommended for drain upsize, which was completed on 05/28/23. She has a relevant PMHX of laparoscopic mesh repair of ventral incisional and incarcerated hernia, with panniculectomy, 04/04/23.   She had initial drains (X2) placed on 04/18/23, immediately yielding 900 mL of fluid from the anterior subcutaneous abdomen. Her drains had been removed in surgical clinic, on 04/30/23, shortly after which she returned with recurrent seroma requiring drain replacement on 05/12/23.  On evaluation today she denies tenderness, fevers or chills. She remains on antibiotics (Doxy) written by her surgeon (Dr Michaell Cowing). She was flushing her drains every other day to keep it open and was getting 15 mL/day but more recently her drain tubing broke and she capped herself.  Review of Systems: A 12-point ROS discussed, and pertinent positives are indicated in the HPI above.  All other systems are negative.   Past Medical History:  Diagnosis Date   Abdominal hernia    Anemia    Anxiety    BMI 45.0-49.9, adult (HCC)    Cancer (HCC)    Colon cancer (HCC)    COVID-19    Depression    GERD (gastroesophageal reflux disease)    History of colon polyps    Hypercholesteremia    Hypertension    occ   Hypothyroidism    IBS (irritable bowel syndrome)    Mitral valve problem    Petechiae 04/04/2023   red patches after ankle   PONV (postoperative nausea and vomiting)    Prediabetes    Squamous cell carcinoma in situ (SCCIS) of skin    Thyroid disease    Vitamin D deficiency     Past Surgical History:  Procedure Laterality Date   ABDOMINAL  HYSTERECTOMY     BLADDER NECK SUSPENSION     CESAREAN SECTION     x 2   colon cancer     COLONOSCOPY     HERNIA REPAIR     Mesh placed then later removed due to infection.   IR CATHETER TUBE CHANGE  05/28/2023   IR RADIOLOGIST EVAL & MGMT  05/23/2023   IR RADIOLOGIST EVAL & MGMT  06/14/2023   laparostomy     VENTRAL HERNIA REPAIR N/A 04/04/2023   Procedure: LAPAROSCOPIC VENTRAL WALL HERNIA WITH LYSIS OF ADHESIONS AND PANNICULECTOMY;  Surgeon: Karie Soda, MD;  Location: [facility];  Service: General;  Laterality: N/A;    Allergies: Amlodipine besylate, Eszopiclone, Tape, Zocor [simvastatin], Clindamycin hcl, Meloxicam, Methylprednisolone, and Penicillin g  Medications: Prior to Admission medications   Medication Sig Start Date End Date Taking? Authorizing Provider  acetaminophen (TYLENOL) 325 MG tablet Take 2 tablets (650 mg total) by mouth every 6 (six) hours as needed for mild pain (or temp > 100). 05/13/23   Juliet Rude, PA-C  ALPRAZolam Prudy Feeler) 0.5 MG tablet Take 0.5 mg by mouth as needed for anxiety.    [provider]  cholecalciferol (VITAMIN D) 1000 units tablet Take 1,000 Units by mouth 4 (four) times a week.    [provider]  Cyanocobalamin (B-12 PO) Take 1 tablet by mouth 4 (four) times a week.    [provider]  estradiol (ESTRACE) 1 MG tablet Take 1 mg by mouth daily.    [provider]  ezetimibe (ZETIA) 10 MG tablet Take 10 mg by mouth daily. 02/25/23   [provider]  FLUoxetine (PROZAC) 10 MG capsule Take 20 mg by mouth daily.    [provider]  fluticasone (FLONASE) 50 MCG/ACT nasal spray Place 1 spray into both nostrils daily as needed for allergies. 12/12/16   [provider]  furosemide (LASIX) 40 MG tablet TAKE 1 TABLET(40 MG TOTAL) BY MOUTH DAILY Patient taking differently: Take 40 mg by mouth daily. 07/09/22   Chandrasekhar, Rondel Jumbo, MD  ketoconazole (NIZORAL) 2 % cream Apply 1 Application  topically daily. 08/22/21   [provider]  levothyroxine (SYNTHROID) 150 MCG tablet Take 150 mcg by mouth daily before breakfast.    [provider]  losartan (COZAAR) 100 MG tablet Take 100 mg by mouth daily. Holding per Dr. 05/10/2023    [provider]  losartan (COZAAR) 100 MG tablet Take 100 mg by mouth daily.    [provider]  ondansetron (ZOFRAN-ODT) 4 MG disintegrating tablet Take 1 tablet (4 mg total) by mouth every 6 (six) hours as needed for nausea. 04/23/23   Karie Soda, MD  oxyCODONE (OXY IR/ROXICODONE) 5 MG immediate release tablet Take 1 tablet (5 mg total) by mouth every 6 (six) hours as needed for severe pain. 05/13/23   Juliet Rude, PA-C  RABEprazole (ACIPHEX) 20 MG tablet Take 20 mg by mouth daily.    [provider]  sodium chloride flush (NS) 0.9 % SOLN 5 mLs by Intracatheter route every 8 (eight) hours. 05/13/23   Juliet Rude, PA-C  spironolactone (ALDACTONE) 25 MG tablet TAKE ONE TABLET BY MOUTH DAILY DOSE INCREASE Patient taking differently: Take 25 mg by mouth once. 10/08/22   Christell Constant, MD     Family History  Problem Relation Age of Onset   Kidney cancer Mother    Diabetes Father     Social History   Socioeconomic History   Marital status: Widowed    Spouse name: Not on file   Number of children: 2   Years of education: 4 yrs college   Highest education level: Not on file  Occupational History   Occupation: Retired  Tobacco Use   Smoking status: Former    Types: Cigarettes   Smokeless tobacco: Never   Tobacco comments:    Quit 1984  Vaping Use   Vaping status: Never Used  Substance and Sexual Activity   Alcohol use: Not Currently    Comment: occasionally   Drug use: No   Sexual activity: Not on file  Other Topics Concern   Not on file  Social History Narrative   Lives at home with husband.   Right-handed.   4-5 cups unsweet tea per week.   Social Determinants of Health    Financial Resource Strain: Not on file  Food Insecurity: Patient Declined (05/10/2023)   Hunger Vital Sign    Worried About Running Out of Food in the Last Year: Patient declined    Ran Out of Food in the Last Year: Patient declined  Transportation Needs: Patient Declined (05/10/2023)   PRAPARE - Administrator, Civil Service (Medical): Patient declined    Lack of Transportation (Non-Medical): Patient declined  Physical Activity: Not on file  Stress: Not on file  Social Connections: Not on file     Vital Signs: BP (!) 139/58  Pulse 68   Temp 97.9 F (36.6 C)   Resp 20   SpO2 97%   Physical Exam  General: WN, NAD  CV: RRR on monitor Pulm: normal work of breathing on RA Abd: S, ND, NT. SQ thickening at the anterior abdomen incision with indwelling drain. MSK: Grossly normal Psych: Appropriate affect.  Imaging:    CT ABDOMEN PELVIS W CONTRAST  Result Date: 06/14/2023 CLINICAL DATA:  Drain clinic evaluation. Right side hernia repair w/ abdominal reconstruction; Pt experiencing new pain LLQ that started 2 days ago EXAM: CT ABDOMEN AND PELVIS WITH CONTRAST TECHNIQUE: Multidetector CT imaging of the abdomen and pelvis was performed using the standard protocol following bolus administration of intravenous contrast. RADIATION DOSE REDUCTION: This exam was performed according to the departmental dose-optimization program which includes automated exposure control, adjustment of the mA and/or kV according to patient size and/or use of iterative reconstruction technique. CONTRAST:  ISOVUE-300 IOPAMIDOL (ISOVUE-300) INJECTION 61% COMPARISON:  CT AP, most recently 05/23/2023 and 05/09/2023. IR CT, 05/12/2023 and fluoroscopy 05/28/2023. FINDINGS: Lower chest: No acute abnormality. Hepatobiliary: No focal liver abnormality. No gallstones, gallbladder wall thickening, or biliary dilatation. Pancreas: No pancreatic ductal dilatation or surrounding inflammatory changes. Spleen:  Normal in size without focal abnormality. Adrenals/Urinary Tract: Adrenal glands are unremarkable. Kidneys are normal, without renal calculi, focal lesion, or hydronephrosis. Bladder is unremarkable. Stomach/Bowel: Small hiatus hernia, stomach is otherwise within normal limits. Appendix appears normal. No evidence of bowel wall thickening, distention, or inflammatory changes. Vascular/Lymphatic: No significant vascular findings are present. No enlarged abdominal or pelvic lymph nodes. Reproductive: Hysterectomy.  No adnexal mass. Other: No abdominopelvic ascites. Small fat-containing supraumbilical ventral hernia. Postsurgical changes of panniculectomy with a large anterior abdominal wall subcutaneous rim-enhancing fluid collection. Stable positioning of percutaneous drainage catheter. The collection measures approximately 20.6 x 2.4 cm (previously 23.5 x 3.1 cm). Musculoskeletal: No acute or significant osseous findings. IMPRESSION: Since CT AP dated 05/23/2023; 1. Postsurgical changes of panniculectomy with residual but decreasing size of large anterior abdominal wall subcutaneous collection. Maximal dimension of 21 cm, previously 24 cm (05/23/2023). 2. No acute intra-abdominal or pelvic process. Electronically Signed   By: Roanna Banning M.D.   On: 06/14/2023 13:25    Labs:  CBC: Recent Labs    04/19/23 0331 04/20/23 0325 04/21/23 0250 05/10/23 1450  WBC 9.7 6.7 6.2 7.2  HGB 9.0* 8.2* 8.4* 11.2*  HCT 29.0* 26.7* 27.6* 35.7*  PLT 226 233 287 279    Assessment and Plan:  67 y/o F w recurrent seroma at anterior low abdominal wall, with seroma drain replaced 05/12/23.  CT AP today with a residual but decreasing size of SQ collection. Reported ~15 mL output/day. Recent Drain malfunction with broken tubing. Pt capped her drain for a few days leading to today's appointment.   *Drain tubing replaced. Drain flushed and patent. *Continue routine drain care.  *RTC for repeat CT and drain evaluation,  in 2 wks. No drain injection required. *Follow up with Surgeon, for opinion and plan given drain chronicity.   Electronically Signed:  Roanna Banning, MD [facility]   Pager. [phone] Clinic. [phone]  I spent a total of 25 Minutes in face to face in clinical consultation, greater than 50% of which was counseling/coordinating care for Jacqueline Mora's drain evaluation and management.

## 2023-06-14 ENCOUNTER — Ambulatory Visit
Admission: RE | Admit: 2023-06-14 | Discharge: 2023-06-14 | Disposition: A | Discharge status: Home / Self Care | Payer: Medicare Other | Source: Ambulatory Visit | Attending: Radiology | Admitting: Radiology

## 2023-06-14 ENCOUNTER — Ambulatory Visit
Admission: RE | Admit: 2023-06-14 | Discharge: 2023-06-14 | Disposition: A | Discharge status: Home / Self Care | Payer: Medicare Other | Source: Ambulatory Visit | Attending: Surgery | Admitting: Surgery

## 2023-06-14 DIAGNOSIS — K91872 Postprocedural seroma of a digestive system organ or structure following a digestive system procedure: Secondary | ICD-10-CM | POA: Diagnosis not present

## 2023-06-14 DIAGNOSIS — L02211 Cutaneous abscess of abdominal wall: Secondary | ICD-10-CM | POA: Diagnosis not present

## 2023-06-14 DIAGNOSIS — K651 Peritoneal abscess: Secondary | ICD-10-CM

## 2023-06-14 DIAGNOSIS — S301XXS Contusion of abdominal wall, sequela: Secondary | ICD-10-CM

## 2023-06-14 DIAGNOSIS — R1032 Left lower quadrant pain: Secondary | ICD-10-CM | POA: Diagnosis not present

## 2023-06-14 HISTORY — PX: IR RADIOLOGIST EVAL & MGMT: IMG5224

## 2023-06-14 MED ORDER — IOPAMIDOL (ISOVUE-300) INJECTION 61%
100.0000 mL | Freq: Once | INTRAVENOUS | Status: AC | PRN
  Administered 2023-06-14: 100 mL via INTRAVENOUS

## 2023-06-17 ENCOUNTER — Other Ambulatory Visit: Payer: Self-pay | Admitting: Surgery

## 2023-06-17 DIAGNOSIS — Z8719 Personal history of other diseases of the digestive system: Secondary | ICD-10-CM

## 2023-06-18 ENCOUNTER — Ambulatory Visit (HOSPITAL_COMMUNITY)
Admission: RE | Admit: 2023-06-18 | Discharge: 2023-06-18 | Disposition: A | Discharge status: Home / Self Care | Payer: Medicare Other | Source: Ambulatory Visit | Attending: Surgery | Admitting: Surgery

## 2023-06-18 ENCOUNTER — Other Ambulatory Visit: Payer: Self-pay | Admitting: Surgery

## 2023-06-18 ENCOUNTER — Encounter (HOSPITAL_COMMUNITY): Payer: Self-pay

## 2023-06-18 DIAGNOSIS — Z8719 Personal history of other diseases of the digestive system: Secondary | ICD-10-CM

## 2023-06-18 HISTORY — PX: IR PATIENT EVAL TECH 0-60 MINS: IMG5564

## 2023-06-18 HISTORY — PX: ABSCESS DRAINAGE: SHX1119

## 2023-06-18 NOTE — Procedures (Signed)
The pt came in today to have her drain bandage looked at. The stat lock was replaced with a cinch device and a new bandage was applied to the site at this time.

## 2023-06-19 DIAGNOSIS — N182 Chronic kidney disease, stage 2 (mild): Secondary | ICD-10-CM | POA: Diagnosis not present

## 2023-06-19 DIAGNOSIS — F419 Anxiety disorder, unspecified: Secondary | ICD-10-CM | POA: Diagnosis not present

## 2023-06-19 DIAGNOSIS — E876 Hypokalemia: Secondary | ICD-10-CM | POA: Diagnosis not present

## 2023-06-19 DIAGNOSIS — D631 Anemia in chronic kidney disease: Secondary | ICD-10-CM | POA: Diagnosis not present

## 2023-06-19 DIAGNOSIS — R7303 Prediabetes: Secondary | ICD-10-CM | POA: Diagnosis not present

## 2023-06-19 DIAGNOSIS — Z6841 Body Mass Index (BMI) 40.0 and over, adult: Secondary | ICD-10-CM | POA: Diagnosis not present

## 2023-06-19 DIAGNOSIS — K649 Unspecified hemorrhoids: Secondary | ICD-10-CM | POA: Diagnosis not present

## 2023-06-19 DIAGNOSIS — I129 Hypertensive chronic kidney disease with stage 1 through stage 4 chronic kidney disease, or unspecified chronic kidney disease: Secondary | ICD-10-CM | POA: Diagnosis not present

## 2023-06-19 DIAGNOSIS — E039 Hypothyroidism, unspecified: Secondary | ICD-10-CM | POA: Diagnosis not present

## 2023-06-19 DIAGNOSIS — I069 Rheumatic aortic valve disease, unspecified: Secondary | ICD-10-CM | POA: Diagnosis not present

## 2023-06-19 DIAGNOSIS — F32A Depression, unspecified: Secondary | ICD-10-CM | POA: Diagnosis not present

## 2023-06-19 DIAGNOSIS — T8149XA Infection following a procedure, other surgical site, initial encounter: Secondary | ICD-10-CM | POA: Diagnosis not present

## 2023-06-19 DIAGNOSIS — L02211 Cutaneous abscess of abdominal wall: Secondary | ICD-10-CM | POA: Diagnosis not present

## 2023-06-19 DIAGNOSIS — E7800 Pure hypercholesterolemia, unspecified: Secondary | ICD-10-CM | POA: Diagnosis not present

## 2023-06-19 DIAGNOSIS — A419 Sepsis, unspecified organism: Secondary | ICD-10-CM | POA: Diagnosis not present

## 2023-06-25 DIAGNOSIS — Z01419 Encounter for gynecological examination (general) (routine) without abnormal findings: Secondary | ICD-10-CM | POA: Diagnosis not present

## 2023-06-25 DIAGNOSIS — Z1231 Encounter for screening mammogram for malignant neoplasm of breast: Secondary | ICD-10-CM | POA: Diagnosis not present

## 2023-06-26 DIAGNOSIS — I10 Essential (primary) hypertension: Secondary | ICD-10-CM | POA: Diagnosis not present

## 2023-06-26 DIAGNOSIS — E039 Hypothyroidism, unspecified: Secondary | ICD-10-CM | POA: Diagnosis not present

## 2023-06-26 DIAGNOSIS — K080 Exfoliation of teeth due to systemic causes: Secondary | ICD-10-CM | POA: Diagnosis not present

## 2023-06-26 DIAGNOSIS — D508 Other iron deficiency anemias: Secondary | ICD-10-CM | POA: Diagnosis not present

## 2023-06-26 DIAGNOSIS — E7800 Pure hypercholesterolemia, unspecified: Secondary | ICD-10-CM | POA: Diagnosis not present

## 2023-06-28 ENCOUNTER — Ambulatory Visit (INDEPENDENT_AMBULATORY_CARE_PROVIDER_SITE_OTHER): Payer: Medicare Other | Admitting: Podiatry

## 2023-06-28 ENCOUNTER — Encounter: Payer: Self-pay | Admitting: Podiatry

## 2023-06-28 DIAGNOSIS — M62461 Contracture of muscle, right lower leg: Secondary | ICD-10-CM | POA: Diagnosis not present

## 2023-06-28 DIAGNOSIS — M722 Plantar fascial fibromatosis: Secondary | ICD-10-CM

## 2023-06-28 NOTE — Progress Notes (Signed)
Subjective:  Patient ID: Jacqueline Mora, female    DOB: 09-Jan-1956,  MRN: [id number]  Chief Complaint  Patient presents with   Plantar Fasciitis    Right foot pain patient wants injection for treatment today.    67 y.o. female presents with the above complaint.  Patient presents with right heel pain.  Pain scale 7 out of 10 dull aching nature hurts with ambulation worse with pressure she would like to discuss injection today.  She states she is not able to walk.  She just had her surgery.     Review of Systems: Negative except as noted in the HPI. Denies N/V/F/Ch.  Past Medical History:  Diagnosis Date   Abdominal hernia    Anemia    Anxiety    BMI 45.0-49.9, adult (HCC)    Cancer (HCC)    Colon cancer (HCC)    COVID-19    Depression    GERD (gastroesophageal reflux disease)    History of colon polyps    Hypercholesteremia    Hypertension    occ   Hypothyroidism    IBS (irritable bowel syndrome)    Mitral valve problem    Petechiae 04/04/2023   red patches after ankle   PONV (postoperative nausea and vomiting)    Prediabetes    Squamous cell carcinoma in situ (SCCIS) of skin    Thyroid disease    Vitamin D deficiency     Current Outpatient Medications:    acetaminophen (TYLENOL) 325 MG tablet, Take 2 tablets (650 mg total) by mouth every 6 (six) hours as needed for mild pain (or temp > 100)., Disp: , Rfl:    ALPRAZolam (XANAX) 0.5 MG tablet, Take 0.5 mg by mouth as needed for anxiety., Disp: , Rfl:    cholecalciferol (VITAMIN D) 1000 units tablet, Take 1,000 Units by mouth 4 (four) times a week., Disp: , Rfl:    Cyanocobalamin (B-12 PO), Take 1 tablet by mouth 4 (four) times a week., Disp: , Rfl:    estradiol (ESTRACE) 1 MG tablet, Take 1 mg by mouth daily., Disp: , Rfl:    ezetimibe (ZETIA) 10 MG tablet, Take 10 mg by mouth daily., Disp: , Rfl:    FLUoxetine (PROZAC) 10 MG capsule, Take 20 mg by mouth daily., Disp: , Rfl:    fluticasone (FLONASE) 50 MCG/ACT nasal  spray, Place 1 spray into both nostrils daily as needed for allergies., Disp: , Rfl: 5   furosemide (LASIX) 40 MG tablet, TAKE 1 TABLET(40 MG TOTAL) BY MOUTH DAILY (Patient taking differently: Take 40 mg by mouth daily.), Disp: 90 tablet, Rfl: 2   ketoconazole (NIZORAL) 2 % cream, Apply 1 Application topically daily., Disp: , Rfl:    levothyroxine (SYNTHROID) 150 MCG tablet, Take 150 mcg by mouth daily before breakfast., Disp: , Rfl:    losartan (COZAAR) 100 MG tablet, Take 100 mg by mouth daily. Holding per Dr. 05/10/2023, Disp: , Rfl:    losartan (COZAAR) 100 MG tablet, Take 100 mg by mouth daily., Disp: , Rfl:    ondansetron (ZOFRAN-ODT) 4 MG disintegrating tablet, Take 1 tablet (4 mg total) by mouth every 6 (six) hours as needed for nausea., Disp: 8 tablet, Rfl: 2   oxyCODONE (OXY IR/ROXICODONE) 5 MG immediate release tablet, Take 1 tablet (5 mg total) by mouth every 6 (six) hours as needed for severe pain., Disp: 28 tablet, Rfl: 0   RABEprazole (ACIPHEX) 20 MG tablet, Take 20 mg by mouth daily., Disp: , Rfl:  sodium chloride flush (NS) 0.9 % SOLN, 5 mLs by Intracatheter route every 8 (eight) hours., Disp: 5 Syringe, Rfl: 1   spironolactone (ALDACTONE) 25 MG tablet, TAKE ONE TABLET BY MOUTH DAILY DOSE INCREASE (Patient taking differently: Take 25 mg by mouth once.), Disp: 90 tablet, Rfl: 1  Social History   Tobacco Use  Smoking Status Former   Types: Cigarettes  Smokeless Tobacco Never  Tobacco Comments   Quit 1984    Allergies  Allergen Reactions   Amlodipine Besylate Other (See Comments)    Dizziness   Eszopiclone     Other reaction(s): Crazy dreams   Tape     Adhesive on medical tape and glue burns skin - requires paper tape.   Zocor [Simvastatin]     myalgias   Clindamycin Hcl Rash   Meloxicam Rash   Methylprednisolone Rash   Penicillin G Rash    Tolerates cefalosporins   Objective:  There were no vitals filed for this visit. There is no height or weight on file to  calculate BMI. Constitutional Well developed. Well nourished.  Vascular Dorsalis pedis pulses palpable bilaterally. Posterior tibial pulses palpable bilaterally. Capillary refill normal to all digits.  No cyanosis or clubbing noted. Pedal hair growth normal.  Neurologic Normal speech. Oriented to person, place, and time. Epicritic sensation to light touch grossly present bilaterally.  Dermatologic Nails well groomed and normal in appearance. No open wounds. No skin lesions.  Orthopedic: Normal joint ROM without pain or crepitus bilaterally. No visible deformities. Tender to palpation at the calcaneal tuber right. No pain with calcaneal squeeze right. Ankle ROM diminished range of motion right. Silfverskiold Test: positive right.   Radiographs: Left none  Assessment:   1. Plantar fasciitis of right foot   2. Gastrocnemius equinus, right    Plan:  Patient was evaluated and treated and all questions answered.  Plantar Fasciitis, right with underlying gastrocnemius equinus - XR reviewed as above.  - Educated on icing and stretching. Instructions given.  - Injection delivered to the plantar fascia as below. - DME: Plantar fascial brace dispensed to support the medial longitudinal arch of the foot and offload pressure from the heel and prevent arch collapse during weightbearing - Pharmacologic management: None -Patient already has a custom orthotic that she is ambulating with  Procedure: Injection Tendon/Ligament Location: Right plantar fascia at the glabrous junction; medial approach. Skin Prep: alcohol Injectate: 0.5 cc 0.5% marcaine plain, 0.5 cc of 1% Lidocaine, 0.5 cc kenalog 10. Disposition: Patient tolerated procedure well. Injection site dressed with a band-aid.  No follow-ups on file.

## 2023-07-02 DIAGNOSIS — E039 Hypothyroidism, unspecified: Secondary | ICD-10-CM | POA: Diagnosis not present

## 2023-07-02 DIAGNOSIS — R7303 Prediabetes: Secondary | ICD-10-CM | POA: Diagnosis not present

## 2023-07-02 DIAGNOSIS — E559 Vitamin D deficiency, unspecified: Secondary | ICD-10-CM | POA: Diagnosis not present

## 2023-07-04 ENCOUNTER — Other Ambulatory Visit: Payer: Medicare Other

## 2023-07-04 ENCOUNTER — Inpatient Hospital Stay: Admission: RE | Admit: 2023-07-04 | Payer: Medicare Other | Source: Ambulatory Visit

## 2023-07-12 ENCOUNTER — Other Ambulatory Visit: Payer: Self-pay | Admitting: Surgery

## 2023-07-12 DIAGNOSIS — R1030 Lower abdominal pain, unspecified: Secondary | ICD-10-CM

## 2023-07-15 ENCOUNTER — Inpatient Hospital Stay: Admission: RE | Admit: 2023-07-15 | Payer: Medicare Other | Source: Ambulatory Visit

## 2023-07-22 ENCOUNTER — Encounter: Payer: Self-pay | Admitting: Pharmacist

## 2023-07-25 ENCOUNTER — Ambulatory Visit
Admission: RE | Admit: 2023-07-25 | Discharge: 2023-07-25 | Disposition: A | Discharge status: Home / Self Care | Payer: Medicare Other | Source: Ambulatory Visit | Attending: Surgery | Admitting: Surgery

## 2023-07-25 DIAGNOSIS — R1030 Lower abdominal pain, unspecified: Secondary | ICD-10-CM

## 2023-07-25 DIAGNOSIS — K439 Ventral hernia without obstruction or gangrene: Secondary | ICD-10-CM | POA: Diagnosis not present

## 2023-07-25 MED ORDER — IOPAMIDOL (ISOVUE-300) INJECTION 61%
100.0000 mL | Freq: Once | INTRAVENOUS | Status: AC | PRN
  Administered 2023-07-25: 100 mL via INTRAVENOUS

## 2023-07-29 MED ORDER — WEGOVY 0.5 MG/0.5ML ~~LOC~~ SOAJ: 0.5000 mg | SUBCUTANEOUS | 0 refills | Status: DC

## 2023-08-02 ENCOUNTER — Encounter: Payer: Self-pay | Admitting: Podiatry

## 2023-08-02 ENCOUNTER — Ambulatory Visit (INDEPENDENT_AMBULATORY_CARE_PROVIDER_SITE_OTHER): Payer: Medicare Other | Admitting: Podiatry

## 2023-08-02 DIAGNOSIS — M62461 Contracture of muscle, right lower leg: Secondary | ICD-10-CM

## 2023-08-02 DIAGNOSIS — M722 Plantar fascial fibromatosis: Secondary | ICD-10-CM | POA: Diagnosis not present

## 2023-08-02 NOTE — Progress Notes (Signed)
Subjective:  Patient ID: Jacqueline Mora, female    DOB: [birth year],  MRN: [id number]  Chief Complaint  Patient presents with   Routine Post Op    Patient states she needs two shots for her bilateral feet.     67 y.o. female presents with the above complaint.  Patient presents with right plantar fasciitis she states is doing little bit better injection helped some.  She wanted to discuss next treatment plan.  She is to get some cramping.  She does not have night splint   Review of Systems: Negative except as noted in the HPI. Denies N/V/F/Ch.  Past Medical History:  Diagnosis Date   Abdominal hernia    Anemia    Anxiety    BMI 45.0-49.9, adult (HCC)    Cancer (HCC)    Colon cancer (HCC)    COVID-19    Depression    GERD (gastroesophageal reflux disease)    History of colon polyps    Hypercholesteremia    Hypertension    occ   Hypothyroidism    IBS (irritable bowel syndrome)    Mitral valve problem    Petechiae 04/04/2023   red patches after ankle   PONV (postoperative nausea and vomiting)    Prediabetes    Squamous cell carcinoma in situ (SCCIS) of skin    Thyroid disease    Vitamin D deficiency     Current Outpatient Medications:    acetaminophen (TYLENOL) 325 MG tablet, Take 2 tablets (650 mg total) by mouth every 6 (six) hours as needed for mild pain (or temp > 100)., Disp: , Rfl:    ALPRAZolam (XANAX) 0.5 MG tablet, Take 0.5 mg by mouth as needed for anxiety., Disp: , Rfl:    cholecalciferol (VITAMIN D) 1000 units tablet, Take 1,000 Units by mouth 4 (four) times a week., Disp: , Rfl:    Cyanocobalamin (B-12 PO), Take 1 tablet by mouth 4 (four) times a week., Disp: , Rfl:    estradiol (ESTRACE) 1 MG tablet, Take 1 mg by mouth daily., Disp: , Rfl:    ezetimibe (ZETIA) 10 MG tablet, Take 10 mg by mouth daily., Disp: , Rfl:    FLUoxetine (PROZAC) 10 MG capsule, Take 20 mg by mouth daily., Disp: , Rfl:    fluticasone (FLONASE) 50 MCG/ACT nasal spray, Place 1 spray  into both nostrils daily as needed for allergies., Disp: , Rfl: 5   furosemide (LASIX) 40 MG tablet, TAKE 1 TABLET(40 MG TOTAL) BY MOUTH DAILY (Patient taking differently: Take 40 mg by mouth daily.), Disp: 90 tablet, Rfl: 2   ketoconazole (NIZORAL) 2 % cream, Apply 1 Application topically daily., Disp: , Rfl:    levothyroxine (SYNTHROID) 150 MCG tablet, Take 150 mcg by mouth daily before breakfast., Disp: , Rfl:    losartan (COZAAR) 100 MG tablet, Take 100 mg by mouth daily. Holding per Dr. 05/10/2023, Disp: , Rfl:    losartan (COZAAR) 100 MG tablet, Take 100 mg by mouth daily., Disp: , Rfl:    RABEprazole (ACIPHEX) 20 MG tablet, Take 20 mg by mouth daily., Disp: , Rfl:    Semaglutide-Weight Management (WEGOVY) 0.5 MG/0.5ML SOAJ, Inject 0.5 mg into the skin once a week., Disp: 2 mL, Rfl: 0   spironolactone (ALDACTONE) 25 MG tablet, TAKE ONE TABLET BY MOUTH DAILY DOSE INCREASE (Patient taking differently: Take 25 mg by mouth once.), Disp: 90 tablet, Rfl: 1   ondansetron (ZOFRAN-ODT) 4 MG disintegrating tablet, Take 1 tablet (4 mg total) by mouth every  6 (six) hours as needed for nausea., Disp: 8 tablet, Rfl: 2   oxyCODONE (OXY IR/ROXICODONE) 5 MG immediate release tablet, Take 1 tablet (5 mg total) by mouth every 6 (six) hours as needed for severe pain., Disp: 28 tablet, Rfl: 0   sodium chloride flush (NS) 0.9 % SOLN, 5 mLs by Intracatheter route every 8 (eight) hours., Disp: 5 Syringe, Rfl: 1  Social History   Tobacco Use  Smoking Status Former   Types: Cigarettes  Smokeless Tobacco Never  Tobacco Comments   Quit 1984    Allergies  Allergen Reactions   Amlodipine Besylate Other (See Comments)    Dizziness   Eszopiclone     Other reaction(s): Crazy dreams   Tape     Adhesive on medical tape and glue burns skin - requires paper tape.   Zocor [Simvastatin]     myalgias   Clindamycin Hcl Rash   Meloxicam Rash   Methylprednisolone Rash   Penicillin G Rash    Tolerates cefalosporins    Objective:  There were no vitals filed for this visit. There is no height or weight on file to calculate BMI. Constitutional Well developed. Well nourished.  Vascular Dorsalis pedis pulses palpable bilaterally. Posterior tibial pulses palpable bilaterally. Capillary refill normal to all digits.  No cyanosis or clubbing noted. Pedal hair growth normal.  Neurologic Normal speech. Oriented to person, place, and time. Epicritic sensation to light touch grossly present bilaterally.  Dermatologic Nails well groomed and normal in appearance. No open wounds. No skin lesions.  Orthopedic: Normal joint ROM without pain or crepitus bilaterally. No visible deformities. Tender to palpation at the calcaneal tuber right. No pain with calcaneal squeeze right. Ankle ROM diminished range of motion right. Silfverskiold Test: positive right.   Radiographs: Left none  Assessment:   No diagnosis found.  Plan:  Patient was evaluated and treated and all questions answered.  Plantar Fasciitis, right with underlying gastrocnemius equinus - XR reviewed as above.  - Educated on icing and stretching. Instructions given.  - second Injection delivered to the plantar fascia as below. - DME: night splint was dispensed.  - Pharmacologic management: None -Patient already has a custom orthotic that she is ambulating with   Procedure: Injection Tendon/Ligament Location: Right plantar fascia at the glabrous junction; medial approach. Skin Prep: alcohol Injectate: 0.5 cc 0.5% marcaine plain, 0.5 cc of 1% Lidocaine, 0.5 cc kenalog 10. Disposition: Patient tolerated procedure well. Injection site dressed with a band-aid.  No follow-ups on file.

## 2023-08-08 DIAGNOSIS — D2271 Melanocytic nevi of right lower limb, including hip: Secondary | ICD-10-CM | POA: Diagnosis not present

## 2023-08-08 DIAGNOSIS — D692 Other nonthrombocytopenic purpura: Secondary | ICD-10-CM | POA: Diagnosis not present

## 2023-08-08 DIAGNOSIS — L821 Other seborrheic keratosis: Secondary | ICD-10-CM | POA: Diagnosis not present

## 2023-08-08 DIAGNOSIS — L650 Telogen effluvium: Secondary | ICD-10-CM | POA: Diagnosis not present

## 2023-08-27 ENCOUNTER — Other Ambulatory Visit: Payer: Self-pay | Admitting: Internal Medicine

## 2023-08-28 MED ORDER — WEGOVY 1 MG/0.5ML ~~LOC~~ SOAJ: 1.0000 mg | SUBCUTANEOUS | 0 refills | Status: DC

## 2023-08-28 NOTE — Addendum Note (Signed)
 Addended by: Cheree Ditto on: 08/28/2023 03:21 PM   Modules accepted: Orders

## 2023-09-19 DIAGNOSIS — E039 Hypothyroidism, unspecified: Secondary | ICD-10-CM | POA: Diagnosis not present

## 2023-09-19 DIAGNOSIS — N949 Unspecified condition associated with female genital organs and menstrual cycle: Secondary | ICD-10-CM | POA: Diagnosis not present

## 2023-09-19 DIAGNOSIS — R7303 Prediabetes: Secondary | ICD-10-CM | POA: Diagnosis not present

## 2023-09-19 DIAGNOSIS — D508 Other iron deficiency anemias: Secondary | ICD-10-CM | POA: Diagnosis not present

## 2023-09-19 DIAGNOSIS — I10 Essential (primary) hypertension: Secondary | ICD-10-CM | POA: Diagnosis not present

## 2023-09-27 ENCOUNTER — Other Ambulatory Visit: Payer: Self-pay | Admitting: Internal Medicine

## 2023-10-11 DIAGNOSIS — N390 Urinary tract infection, site not specified: Secondary | ICD-10-CM | POA: Diagnosis not present

## 2023-10-17 DIAGNOSIS — Q828 Other specified congenital malformations of skin: Secondary | ICD-10-CM | POA: Diagnosis not present

## 2023-10-17 DIAGNOSIS — L565 Disseminated superficial actinic porokeratosis (DSAP): Secondary | ICD-10-CM | POA: Diagnosis not present

## 2023-10-17 DIAGNOSIS — D485 Neoplasm of uncertain behavior of skin: Secondary | ICD-10-CM | POA: Diagnosis not present

## 2023-10-18 ENCOUNTER — Other Ambulatory Visit: Payer: Self-pay | Admitting: Internal Medicine

## 2023-10-28 ENCOUNTER — Other Ambulatory Visit: Payer: Self-pay | Admitting: Internal Medicine

## 2023-10-28 DIAGNOSIS — R350 Frequency of micturition: Secondary | ICD-10-CM | POA: Diagnosis not present

## 2023-10-28 DIAGNOSIS — Z6841 Body Mass Index (BMI) 40.0 and over, adult: Secondary | ICD-10-CM | POA: Diagnosis not present

## 2023-10-28 DIAGNOSIS — N898 Other specified noninflammatory disorders of vagina: Secondary | ICD-10-CM | POA: Diagnosis not present

## 2023-10-28 MED ORDER — WEGOVY 1.7 MG/0.75ML ~~LOC~~ SOAJ: 1.7000 mg | SUBCUTANEOUS | 0 refills | Status: DC

## 2023-11-05 DIAGNOSIS — F5104 Psychophysiologic insomnia: Secondary | ICD-10-CM | POA: Diagnosis not present

## 2023-11-05 DIAGNOSIS — R0683 Snoring: Secondary | ICD-10-CM | POA: Diagnosis not present

## 2023-11-05 DIAGNOSIS — G478 Other sleep disorders: Secondary | ICD-10-CM | POA: Diagnosis not present

## 2023-11-05 DIAGNOSIS — G4719 Other hypersomnia: Secondary | ICD-10-CM | POA: Diagnosis not present

## 2023-11-24 ENCOUNTER — Other Ambulatory Visit: Payer: Self-pay | Admitting: Internal Medicine

## 2023-11-25 ENCOUNTER — Other Ambulatory Visit: Payer: Self-pay | Admitting: Internal Medicine

## 2023-11-25 MED ORDER — WEGOVY 1.7 MG/0.75ML ~~LOC~~ SOAJ: 1.7000 mg | SUBCUTANEOUS | 0 refills | Status: DC

## 2023-11-26 ENCOUNTER — Other Ambulatory Visit: Payer: Self-pay | Admitting: Internal Medicine

## 2023-11-26 MED ORDER — FUROSEMIDE 40 MG PO TABS: 40.0000 mg | ORAL_TABLET | Freq: Every day | ORAL | 0 refills | Status: DC

## 2023-11-28 DIAGNOSIS — G4733 Obstructive sleep apnea (adult) (pediatric): Secondary | ICD-10-CM | POA: Diagnosis not present

## 2023-11-29 DIAGNOSIS — E559 Vitamin D deficiency, unspecified: Secondary | ICD-10-CM | POA: Diagnosis not present

## 2023-11-29 DIAGNOSIS — R7303 Prediabetes: Secondary | ICD-10-CM | POA: Diagnosis not present

## 2023-11-29 DIAGNOSIS — Z23 Encounter for immunization: Secondary | ICD-10-CM | POA: Diagnosis not present

## 2023-11-29 DIAGNOSIS — E7800 Pure hypercholesterolemia, unspecified: Secondary | ICD-10-CM | POA: Diagnosis not present

## 2023-11-29 DIAGNOSIS — E039 Hypothyroidism, unspecified: Secondary | ICD-10-CM | POA: Diagnosis not present

## 2023-11-29 DIAGNOSIS — Z0000 Encounter for general adult medical examination without abnormal findings: Secondary | ICD-10-CM | POA: Diagnosis not present

## 2023-12-02 DIAGNOSIS — F5104 Psychophysiologic insomnia: Secondary | ICD-10-CM | POA: Diagnosis not present

## 2023-12-02 DIAGNOSIS — G4733 Obstructive sleep apnea (adult) (pediatric): Secondary | ICD-10-CM | POA: Diagnosis not present

## 2023-12-17 ENCOUNTER — Other Ambulatory Visit: Payer: Self-pay | Admitting: Surgery

## 2023-12-17 DIAGNOSIS — R1030 Lower abdominal pain, unspecified: Secondary | ICD-10-CM

## 2023-12-17 DIAGNOSIS — Z8719 Personal history of other diseases of the digestive system: Secondary | ICD-10-CM

## 2023-12-17 DIAGNOSIS — K430 Incisional hernia with obstruction, without gangrene: Secondary | ICD-10-CM

## 2023-12-19 ENCOUNTER — Encounter: Payer: Self-pay | Admitting: Internal Medicine

## 2023-12-20 MED ORDER — WEGOVY 2.4 MG/0.75ML ~~LOC~~ SOAJ: 2.4000 mg | SUBCUTANEOUS | 11 refills | Status: DC

## 2023-12-22 ENCOUNTER — Encounter: Payer: Self-pay | Admitting: Podiatry

## 2023-12-23 ENCOUNTER — Ambulatory Visit
Admission: RE | Admit: 2023-12-23 | Discharge: 2023-12-23 | Disposition: A | Discharge status: Home / Self Care | Source: Ambulatory Visit | Attending: Surgery | Admitting: Surgery

## 2023-12-23 ENCOUNTER — Encounter: Payer: Self-pay | Admitting: Radiology

## 2023-12-23 DIAGNOSIS — K430 Incisional hernia with obstruction, without gangrene: Secondary | ICD-10-CM

## 2023-12-23 DIAGNOSIS — Z8719 Personal history of other diseases of the digestive system: Secondary | ICD-10-CM

## 2023-12-23 DIAGNOSIS — R1030 Lower abdominal pain, unspecified: Secondary | ICD-10-CM

## 2023-12-23 MED ORDER — IOPAMIDOL (ISOVUE-300) INJECTION 61%
100.0000 mL | Freq: Once | INTRAVENOUS | Status: AC | PRN
  Administered 2023-12-23: 100 mL via INTRAVENOUS

## 2024-01-03 ENCOUNTER — Encounter: Payer: Self-pay | Admitting: Internal Medicine

## 2024-01-03 MED ORDER — SPIRONOLACTONE 25 MG PO TABS: 25.0000 mg | ORAL_TABLET | Freq: Every day | ORAL | 0 refills | Status: DC

## 2024-01-03 NOTE — Telephone Encounter (Signed)
 Called pt scheduled OV for 03/03/24 at 11:40 am.  Sent in script for spironolactone .

## 2024-01-07 DIAGNOSIS — K080 Exfoliation of teeth due to systemic causes: Secondary | ICD-10-CM | POA: Diagnosis not present

## 2024-01-29 ENCOUNTER — Ambulatory Visit: Admitting: Podiatry

## 2024-01-29 DIAGNOSIS — M7661 Achilles tendinitis, right leg: Secondary | ICD-10-CM

## 2024-01-29 DIAGNOSIS — M62461 Contracture of muscle, right lower leg: Secondary | ICD-10-CM

## 2024-01-29 NOTE — Progress Notes (Signed)
 Subjective:  Patient ID: Jacqueline Mora, female    DOB: Jun 12, 1956,  MRN: [id number]  Chief Complaint  Patient presents with   Foot Pain    Right foot pain    68 y.o. female presents with the above complaint.  Patient presents for follow-up of right posterior Achilles heel spur/Achilles tendinitis.  She still started coming back at the surgical site foot.  Wanted to get it evaluated.  Injections in the past have helped   Review of Systems: Negative except as noted in the HPI. Denies N/V/F/Ch.  Past Medical History:  Diagnosis Date   Abdominal hernia    Anemia    Anxiety    BMI 45.0-49.9, adult (HCC)    Cancer (HCC)    Colon cancer (HCC)    COVID-19    Depression    GERD (gastroesophageal reflux disease)    History of colon polyps    Hypercholesteremia    Hypertension    occ   Hypothyroidism    IBS (irritable bowel syndrome)    Mitral valve problem    Petechiae 04/04/2023   red patches after ankle   PONV (postoperative nausea and vomiting)    Prediabetes    Squamous cell carcinoma in situ (SCCIS) of skin    Thyroid  disease    Vitamin D  deficiency     Current Outpatient Medications:    acetaminophen  (TYLENOL ) 325 MG tablet, Take 2 tablets (650 mg total) by mouth every 6 (six) hours as needed for mild pain (or temp > 100)., Disp: , Rfl:    ALPRAZolam  (XANAX ) 0.5 MG tablet, Take 0.5 mg by mouth as needed for anxiety., Disp: , Rfl:    cholecalciferol  (VITAMIN D ) 1000 units tablet, Take 1,000 Units by mouth 4 (four) times a week., Disp: , Rfl:    Cyanocobalamin  (B-12 PO), Take 1 tablet by mouth 4 (four) times a week., Disp: , Rfl:    estradiol  (ESTRACE ) 1 MG tablet, Take 1 mg by mouth daily., Disp: , Rfl:    ezetimibe  (ZETIA ) 10 MG tablet, TAKE 1 TABLET BY MOUTH DAILY, Disp: 90 tablet, Rfl: 2   ezetimibe  (ZETIA ) 10 MG tablet, TAKE 1 TABLET BY MOUTH DAILY, Disp: 90 tablet, Rfl: 2   FLUoxetine  (PROZAC ) 10 MG capsule, Take 20 mg by mouth daily., Disp: , Rfl:    fluticasone   (FLONASE ) 50 MCG/ACT nasal spray, Place 1 spray into both nostrils daily as needed for allergies., Disp: , Rfl: 5   furosemide  (LASIX ) 40 MG tablet, Take 1 tablet (40 mg total) by mouth daily., Disp: 90 tablet, Rfl: 0   ketoconazole  (NIZORAL ) 2 % cream, Apply 1 Application topically daily., Disp: , Rfl:    levothyroxine  (SYNTHROID ) 150 MCG tablet, Take 150 mcg by mouth daily before breakfast., Disp: , Rfl:    losartan  (COZAAR ) 100 MG tablet, Take 100 mg by mouth daily. Holding per Dr. 05/10/2023, Disp: , Rfl:    losartan  (COZAAR ) 100 MG tablet, Take 100 mg by mouth daily., Disp: , Rfl:    ondansetron  (ZOFRAN -ODT) 4 MG disintegrating tablet, Take 1 tablet (4 mg total) by mouth every 6 (six) hours as needed for nausea., Disp: 8 tablet, Rfl: 2   oxyCODONE  (OXY IR/ROXICODONE ) 5 MG immediate release tablet, Take 1 tablet (5 mg total) by mouth every 6 (six) hours as needed for severe pain., Disp: 28 tablet, Rfl: 0   RABEprazole (ACIPHEX) 20 MG tablet, Take 20 mg by mouth daily., Disp: , Rfl:    Semaglutide -Weight Management (WEGOVY ) 2.4 MG/0.75ML SOAJ,  Inject 2.4 mg into the skin once a week., Disp: 3 mL, Rfl: 11   sodium chloride  flush (NS) 0.9 % SOLN, 5 mLs by Intracatheter route every 8 (eight) hours., Disp: 5 Syringe, Rfl: 1   spironolactone  (ALDACTONE ) 25 MG tablet, Take 1 tablet (25 mg total) by mouth daily., Disp: 90 tablet, Rfl: 0  Social History   Tobacco Use  Smoking Status Former   Types: Cigarettes  Smokeless Tobacco Never  Tobacco Comments   Quit 1984    Allergies  Allergen Reactions   Amlodipine  Besylate Other (See Comments)    Dizziness   Eszopiclone     Other reaction(s): Crazy dreams   Tape     Adhesive on medical tape and glue burns skin - requires paper tape.   Zocor [Simvastatin]     myalgias   Clindamycin Hcl Rash   Meloxicam  Rash   Methylprednisolone  Rash   Penicillin G Rash    Tolerates cefalosporins   Objective:  There were no vitals filed for this  visit. There is no height or weight on file to calculate BMI. Constitutional Well developed. Well nourished.  Vascular Dorsalis pedis pulses palpable bilaterally. Posterior tibial pulses palpable bilaterally. Capillary refill normal to all digits.  No cyanosis or clubbing noted. Pedal hair growth normal.  Neurologic Normal speech. Oriented to person, place, and time. Epicritic sensation to light touch grossly present bilaterally.  Dermatologic Nails well groomed and normal in appearance. No open wounds. No skin lesions.  Orthopedic: Pain on palpation of right Achilles tendon insertional pain.  Posterior heel spurring is with/Haglund's deformity clinically appreciated.  Positive Silfverskiold test with gastrocnemius equinus noted.   Radiographs: 3 views of skeletally mature the right foot: Posterior heel spurring noted plantar heel spurring noted pes planovalgus foot structure noted.  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  No other bony abnormalities identified. Assessment:   No diagnosis found.   Plan:  Patient was evaluated and treated and all questions answered.  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  She still has some residual pain.  At this time I discussed that she would benefit from a steroid injection I discussed that given this is near the tendon there is a risk of rupture associated with it.  She states understanding to proceed with a steroid injection. -A steroid injection was performed at right Kager's fat pad using 1% plain Lidocaine  and 10 mg of Kenalog. This was well tolerated.  - Continue wearing orthotics with heel lifts  No follow-ups on file.

## 2024-02-14 ENCOUNTER — Telehealth: Payer: Self-pay | Admitting: Podiatry

## 2024-02-14 ENCOUNTER — Encounter: Payer: Self-pay | Admitting: Podiatry

## 2024-02-14 DIAGNOSIS — M7661 Achilles tendinitis, right leg: Secondary | ICD-10-CM

## 2024-02-14 NOTE — Telephone Encounter (Signed)
 Patient called the office to let you know that her insurance would like to have Prior Authorization sent to them for the Orthotics to be covered by them. If you could get the order in so that way we can get it Authorized so that way she can come get fitted with [facility] for the Orthotics.

## 2024-03-03 ENCOUNTER — Ambulatory Visit: Attending: Internal Medicine | Admitting: Internal Medicine

## 2024-03-03 ENCOUNTER — Encounter: Payer: Self-pay | Admitting: Internal Medicine

## 2024-03-03 VITALS — BP 128/75 | HR 70 | Ht 66.0 in | Wt 242.4 lb

## 2024-03-03 DIAGNOSIS — R931 Abnormal findings on diagnostic imaging of heart and coronary circulation: Secondary | ICD-10-CM | POA: Diagnosis not present

## 2024-03-03 DIAGNOSIS — I10 Essential (primary) hypertension: Secondary | ICD-10-CM | POA: Diagnosis not present

## 2024-03-03 DIAGNOSIS — I509 Heart failure, unspecified: Secondary | ICD-10-CM | POA: Diagnosis not present

## 2024-03-03 DIAGNOSIS — E785 Hyperlipidemia, unspecified: Secondary | ICD-10-CM

## 2024-03-03 DIAGNOSIS — R072 Precordial pain: Secondary | ICD-10-CM

## 2024-03-03 DIAGNOSIS — Z01810 Encounter for preprocedural cardiovascular examination: Secondary | ICD-10-CM

## 2024-03-03 NOTE — Progress Notes (Signed)
 Cardiology Office Note:    Date:  03/03/2024   ID:  Jacqueline Mora, DOB September 19, 1955, MRN [id number]  PCP:  Jacqueline Mora   [facility]  Cardiologist:  Jacqueline DELENA Leavens, MD  Advanced Practice Provider:  No care team member to display Electrophysiologist:  None      CC: HFpEF follow up  History of Present Illness:    Jacqueline Mora is a 68 y.o. female with a hx of HTN, HLD, who presents for evaluation of chest pain 12/08/20. 2022: diastolic dysfunction on echo and started on low dose lasix  has had higher dose of losartan . Had negative Lexiscan  with a hypertensive response and started amlodipine  in interim.   2023: started on Saxenda and had issues with aldactone  (dizziness), lost 25 lbs with Saxenda 2024: Started Wegovy , rare PVC with Jacqueline Mora is a 68 year old female with chronic heart failure who presents for medication refill and evaluation of her condition.  She has a history of chronic heart failure, initially diagnosed in 2022. She has not experienced any dyspnea since starting on diuretics. She is currently taking Lasix  40 mg daily and spironolactone . She mentions a previous rare PVC noted on an EKG but has not had any recent palpitations.  She has been working on weight loss and has seen significant progress. Her BMI has decreased from 47 to 39. She initially lost 25 pounds on Saxenda and is currently on Wegovy , which she started in December after her surgery. Weight loss has been slow but steady. She experiences some nausea with Wegovy  and has a history of reflux, which she manages by being cautious with her diet. She focuses on fiber and hydration to manage constipation, especially post-surgery.  She has non-obstructive coronary disease and has been working on prevention. Her LDL cholesterol has improved but remains above goal. She has experienced myalgias with statin medications in the past, which limits her treatment options.  She is interested in monitoring her cholesterol levels to ensure they are heading in the right direction.  She reports a history of swelling, which has improved but still persists slightly.  Past Medical History:  Diagnosis Date   Abdominal hernia    Anemia    Anxiety    BMI 45.0-49.9, adult (HCC)    Cancer (HCC)    Colon cancer (HCC)    COVID-19    Depression    GERD (gastroesophageal reflux disease)    History of colon polyps    Hypercholesteremia    Hypertension    occ   Hypothyroidism    IBS (irritable bowel syndrome)    Mitral valve problem    Petechiae 04/04/2023   red patches after ankle   PONV (postoperative nausea and vomiting)    Prediabetes    Squamous cell carcinoma in situ (SCCIS) of skin    Thyroid  disease    Vitamin D  deficiency     Past Surgical History:  Procedure Laterality Date   ABDOMINAL HYSTERECTOMY     ABSCESS DRAINAGE  06/18/2023   BLADDER NECK SUSPENSION     CESAREAN SECTION     x 2   colon cancer     COLONOSCOPY     HERNIA REPAIR     Mesh placed then later removed due to infection.   IR CATHETER TUBE CHANGE  05/28/2023   IR PATIENT EVAL TECH 0-60 MINS  06/18/2023   IR RADIOLOGIST EVAL & MGMT  05/23/2023   IR RADIOLOGIST EVAL & MGMT  06/14/2023   laparostomy     VENTRAL HERNIA REPAIR N/A 04/04/2023   Procedure: LAPAROSCOPIC VENTRAL WALL HERNIA WITH LYSIS OF ADHESIONS AND PANNICULECTOMY;  Surgeon: Jacqueline Standing, MD;  Location: [facility];  Service: General;  Laterality: N/A;    Current Medications: Current Meds  Medication Sig   ALPRAZolam  (XANAX ) 0.5 MG tablet Take 0.5 mg by mouth as needed for anxiety.   cholecalciferol  (VITAMIN D ) 1000 units tablet Take 1,000 Units by mouth 4 (four) times a week. (Patient taking differently: Take 1,000 Units by mouth daily.)   Cyanocobalamin  (B-12 PO) Take 1 tablet by mouth 4 (four) times a week.   estradiol  (ESTRACE ) 1 MG tablet Take 1 mg by mouth daily.   ezetimibe  (ZETIA ) 10 MG tablet TAKE 1 TABLET BY  MOUTH DAILY   ezetimibe  (ZETIA ) 10 MG tablet TAKE 1 TABLET BY MOUTH DAILY   FLUoxetine  (PROZAC ) 10 MG capsule Take 20 mg by mouth daily. (Patient taking differently: Take 30 mg by mouth daily.)   fluticasone  (FLONASE ) 50 MCG/ACT nasal spray Place 1 spray into both nostrils daily as needed for allergies.   furosemide  (LASIX ) 40 MG tablet Take 1 tablet (40 mg total) by mouth daily.   hydrocortisone 2.5 % cream as needed (rash).   ketoconazole  (NIZORAL ) 2 % cream Apply 1 Application topically daily.   levothyroxine  (SYNTHROID ) 150 MCG tablet Take 150 mcg by mouth daily before breakfast.   losartan  (COZAAR ) 100 MG tablet Take 100 mg by mouth daily. Holding per Dr. 05/10/2023   losartan  (COZAAR ) 100 MG tablet Take 100 mg by mouth daily.   ondansetron  (ZOFRAN -ODT) 4 MG disintegrating tablet Take 1 tablet (4 mg total) by mouth every 6 (six) hours as needed for nausea.   RABEprazole (ACIPHEX) 20 MG tablet Take 20 mg by mouth daily.   Semaglutide -Weight Management (WEGOVY ) 2.4 MG/0.75ML SOAJ Inject 2.4 mg into the skin once a week.   spironolactone  (ALDACTONE ) 25 MG tablet Take 1 tablet (25 mg total) by mouth daily.     Allergies:   Amlodipine  besylate, Eszopiclone, Tape, Zocor [simvastatin], Clindamycin hcl, Meloxicam , Methylprednisolone , and Penicillin g   Social History   Socioeconomic History   Marital status: Widowed    Spouse name: Not on file   Number of children: 2   Years of education: 4 yrs college   Highest education level: Not on file  Occupational History   Occupation: Retired  Tobacco Use   Smoking status: Former    Types: Cigarettes   Smokeless tobacco: Never   Tobacco comments:    Quit 1984  Vaping Use   Vaping status: Never Used  Substance and Sexual Activity   Alcohol use: Not Currently    Comment: occasionally   Drug use: No   Sexual activity: Not on file  Other Topics Concern   Not on file  Social History Narrative   Lives at home with husband.   Right-handed.    4-5 cups unsweet tea per week.   Social Drivers of Corporate investment banker Strain: Not on file  Food Insecurity: Patient Declined (05/10/2023)   Hunger Vital Sign    Worried About Running Out of Food in the Last Year: Patient declined    Ran Out of Food in the Last Year: Patient declined  Transportation Needs: Patient Declined (05/10/2023)   PRAPARE - Administrator, Civil Service (Medical): Patient declined    Lack of Transportation (Non-Medical): Patient declined  Physical Activity: Not on file  Stress: Not on  file  Social Connections: Not on file    Social: brother had a 4Vd Bypass  Family History: The patient's family history includes Diabetes in her father; Kidney cancer in her mother. History of coronary artery disease notable for two aunts and brother. History of PAD in father. History of heart failure notable for no members. History of arrhythmia notable for no members.  ROS:   Please see the history of present illness.      EKGs/Labs/Other Studies Reviewed:    Cardiac Studies & Procedures   ______________________________________________________________________________________________   STRESS TESTS  MYOCARDIAL PERFUSION IMAGING 05/11/2021  Interpretation Summary   The study is normal. The study is low risk.   LV perfusion is normal.   Left ventricular function is normal. Nuclear stress EF: 58 %. The left ventricular ejection fraction is normal (55-65%). End diastolic cavity size is normal.   Prior study not available for comparison.  Low risk stress nuclear study with normal perfusion and normal left ventricular regional and global systolic function.   ECHOCARDIOGRAM  ECHOCARDIOGRAM COMPLETE 01/09/2021  Narrative ECHOCARDIOGRAM REPORT    Patient Name:   Jacqueline Mora Date of Exam: 01/09/2021 Medical Rec #:  [id number]       Height:       66.0 in Accession #:    [id number]      Weight:       292.0 lb Date of Birth:  [birth year]       BSA:          2.349 m Patient Age:    64 years        BP:           140/70 mmHg Patient Gender: F               HR:           64 bpm. Exam Location:  [address]  Procedure: 2D Echo, Cardiac Doppler and Color Doppler  Indications:    R06.00 SOB  History:        Patient has no prior history of Echocardiogram examinations. Signs/Symptoms:Shortness of Breath and Chest Pain; Risk Factors:Morbid obesity, Hypertension, Dyslipidemia and Former Smoker.  Sonographer:    Elsie Bohr RDCS Referring Phys: [id number] [facility] A SANTO   Sonographer Comments: Technically difficult study due to poor echo windows and patient is morbidly obese. Image acquisition challenging due to patient body habitus and Image acquisition challenging due to respiratory motion. IMPRESSIONS   1. Left ventricular ejection fraction, by estimation, is 60 to 65%. The left ventricle has normal function. The left ventricle has no regional wall motion abnormalities. There is mild left ventricular hypertrophy. Left ventricular diastolic parameters are consistent with Grade II diastolic dysfunction (pseudonormalization). Elevated left ventricular end-diastolic pressure. 2. Right ventricular systolic function is normal. The right ventricular size is normal. 3. Left atrial size was mildly dilated. 4. The mitral valve is abnormal. Trivial mitral valve regurgitation. No evidence of mitral stenosis. 5. The aortic valve was not well visualized. There is mild calcification of the aortic valve. Aortic valve regurgitation is not visualized. Mild aortic valve sclerosis is present, with no evidence of aortic valve stenosis. 6. The inferior vena cava is normal in size with greater than 50% respiratory variability, suggesting right atrial pressure of 3 mmHg.  FINDINGS Left Ventricle: Left ventricular ejection fraction, by estimation, is 60 to 65%. The left ventricle has normal function. The left ventricle has no regional wall motion  abnormalities. The left ventricular internal cavity size was normal  in size. There is mild left ventricular hypertrophy. Left ventricular diastolic parameters are consistent with Grade II diastolic dysfunction (pseudonormalization). Elevated left ventricular end-diastolic pressure.  Right Ventricle: The right ventricular size is normal. No increase in right ventricular wall thickness. Right ventricular systolic function is normal.  Left Atrium: Left atrial size was mildly dilated.  Right Atrium: Right atrial size was normal in size.  Pericardium: There is no evidence of pericardial effusion.  Mitral Valve: The mitral valve is abnormal. There is mild thickening of the mitral valve leaflet(s). There is mild calcification of the mitral valve leaflet(s). Mild mitral annular calcification. Trivial mitral valve regurgitation. No evidence of mitral valve stenosis.  Tricuspid Valve: The tricuspid valve is normal in structure. Tricuspid valve regurgitation is mild . No evidence of tricuspid stenosis.  Aortic Valve: The aortic valve was not well visualized. There is mild calcification of the aortic valve. Aortic valve regurgitation is not visualized. Mild aortic valve sclerosis is present, with no evidence of aortic valve stenosis.  Pulmonic Valve: The pulmonic valve was normal in structure. Pulmonic valve regurgitation is not visualized. No evidence of pulmonic stenosis.  Aorta: The aortic root is normal in size and structure.  Venous: The inferior vena cava is normal in size with greater than 50% respiratory variability, suggesting right atrial pressure of 3 mmHg.  IAS/Shunts: No atrial level shunt detected by color flow Doppler.   LEFT VENTRICLE PLAX 2D LVIDd:         4.40 cm  Diastology LVIDs:         3.00 cm  LV e' medial:    5.77 cm/s LV PW:         1.00 cm  LV E/e' medial:  23.2 LV IVS:        1.30 cm  LV e' lateral:   6.42 cm/s LVOT diam:     2.10 cm  LV E/e' lateral: 20.9 LV SV:          86 LV SV Index:   37 LVOT Area:     3.46 cm   RIGHT VENTRICLE            IVC RVSP:           32.2 mmHg  IVC diam: 1.50 cm  LEFT ATRIUM             Index       RIGHT ATRIUM           Index LA diam:        4.10 cm 1.75 cm/m  RA Pressure: 3.00 mmHg LA Vol (A2C):   56.6 ml 24.10 ml/m RA Area:     13.70 cm LA Vol (A4C):   53.2 ml 22.65 ml/m RA Volume:   35.80 ml  15.24 ml/m LA Biplane Vol: 55.5 ml 23.63 ml/m AORTIC VALVE LVOT Vmax:   97.90 cm/s LVOT Vmean:  65.800 cm/s LVOT VTI:    0.249 m  AORTA Ao Root diam: 3.10 cm Ao Asc diam:  2.90 cm  MV E velocity: 134.00 cm/s  TRICUSPID VALVE MV A velocity: 108.00 cm/s  TR Peak grad:   29.2 mmHg MV E/A ratio:  1.24         TR Vmax:        270.00 cm/s Estimated RAP:  3.00 mmHg RVSP:           32.2 mmHg  SHUNTS Systemic VTI:  0.25 m Systemic Diam: 2.10 cm  Maude Emmer MD Electronically signed by Maude Emmer  MD Signature Date/Time: 01/09/2021/2:33:53 PM    Final      CT SCANS  CT CORONARY MORPH W/CTA COR W/SCORE 12/10/2022  Addendum 12/14/2022  6:15 PM ADDENDUM REPORT: 12/14/2022 18:13  EXAM: OVER-READ INTERPRETATION  CT CHEST  The following report is an over-read performed by radiologist Dr. Andrea Gasman of [facility] on 12/14/2022. This over-read does not include interpretation of cardiac or coronary anatomy or pathology. The coronary CTA interpretation by the cardiologist is attached.  COMPARISON:  None.  FINDINGS: Vascular: Minor aortic atherosclerosis. The included aorta is normal in caliber.  Mediastinum/nodes: No adenopathy or mass. Unremarkable esophagus.  Lungs: No focal airspace disease. No pulmonary nodule. No pleural fluid. Mild bronchial thickening.  Upper abdomen: No acute or unexpected findings.  Musculoskeletal: There are no acute or suspicious osseous abnormalities.  IMPRESSION: Mild bronchial thickening.   Electronically Signed By: Andrea Gasman M.D. On:  12/14/2022 18:13  Narrative CLINICAL DATA:  89F with hypertension, hyperlipidemia and chest pain.  EXAM: Cardiac/Coronary  CT  TECHNIQUE: The patient was scanned on a [employer].  FINDINGS: A 120 kV prospective scan was triggered in the descending thoracic aorta at 111 HU's. Axial non-contrast 3 mm slices were carried out through the heart. The data set was analyzed on a dedicated work station and scored using the Agatson method. Gantry rotation speed was 250 msecs and collimation was .6 mm. No beta blockade and 0.8 mg of sl NTG was given. The 3D data set was reconstructed in 5% intervals of the 67-82 % of the R-R cycle. Diastolic phases were analyzed on a dedicated work station using MPR, MIP and VRT modes. The patient received 80 cc of contrast.  Aorta: Normal size.  No calcifications.  No dissection.  Aortic Valve:  Trileaflet.  No calcifications.  Coronary Arteries:  Normal coronary origin.  Right dominance.  RCA is a small non-dominant artery.  There is no plaque.  Left main is a large artery that gives rise to LAD, RI, and LCX arteries.  LAD is a large vessel that has minimal (<25%) calcified plaque in the mid LAD. D1 and D2 have no plaque.  RI has no plaque.  LCX is a dominant artery that has minimal (<25%) soft plaque at the ostium. It gives rise to OM1, OM2 and L-PDA without plaque.  Coronary Calcium  Score:  Left main: 0  Left anterior descending artery: 54  Left circumflex artery: 0  Right coronary artery: 0  Total: 54  Percentile: 70th  Other findings:  Normal pulmonary vein drainage into the left atrium.  Normal let atrial appendage without a thrombus.  Normal size of the pulmonary artery.  IMPRESSION: 1. Coronary calcium  score of 54. This was 70th percentile for age-, sex, and race-matched controls.  2. Total plaque volume 83 mm3 which is 32nd percentile for age- and sex-matched controls (calcified plaque 7 mm3;  non-calcified plaque 76 mm3). TPV is mild.  2. Normal coronary origin with left dominance.  3. There is minimal (<25%) plaque in the LAD and LCX.  CAD-RADS 1.  RECOMMENDATIONS: 1. CAD-RADS 0: No evidence of CAD (0%). Consider non-atherosclerotic causes of chest pain.  2. CAD-RADS 1: Minimal non-obstructive CAD (0-24%). Consider non-atherosclerotic causes of chest pain. Consider preventive therapy and risk factor modification.  3. CAD-RADS 2: Mild non-obstructive CAD (25-49%). Consider non-atherosclerotic causes of chest pain. Consider preventive therapy and risk factor modification.  4. CAD-RADS 3: Moderate stenosis. Consider symptom-guided anti-ischemic pharmacotherapy as well as risk factor modification per  guideline directed care. Additional analysis with CT FFR will be submitted.  5. CAD-RADS 4: Severe stenosis. (70-99% or > 50% left main). Cardiac catheterization or CT FFR is recommended. Consider symptom-guided anti-ischemic pharmacotherapy as well as risk factor modification per guideline directed care. Invasive coronary angiography recommended with revascularization per published guideline statements.  6. CAD-RADS 5: Total coronary occlusion (100%). Consider cardiac catheterization or viability assessment. Consider symptom-guided anti-ischemic pharmacotherapy as well as risk factor modification per guideline directed care.  7. CAD-RADS N: Non-diagnostic study. Obstructive CAD can't be excluded. Alternative evaluation is recommended.  Jacqueline Scarce, MD  Electronically Signed: By: Jacqueline Mora M.D. On: 12/10/2022 18:19     ______________________________________________________________________________________________       Recent Labs: 04/18/2023: ALT 14 04/21/2023: Magnesium  2.1 04/23/2023: BUN 10; Potassium 3.4; Sodium 135 05/10/2023: Hemoglobin 11.2; Platelets 279 05/13/2023: Creatinine, Ser 0.81  Recent Lipid Panel    Component Value Date/Time    CHOL 181 03/29/2022 1150   TRIG 154 (H) 03/29/2022 1150   HDL 53 03/29/2022 1150   CHOLHDL 3.4 03/29/2022 1150   LDLCALC 101 (H) 03/29/2022 1150     Physical Exam:    VS:  BP 128/75 (BP Location: Right Arm)   Pulse 70   Ht 5' 6 (1.676 m)   Wt 242 lb 6.4 oz (110 kg)   SpO2 95%   BMI 39.12 kg/m     Wt Readings from Last 3 Encounters:  03/03/24 242 lb 6.4 oz (110 kg)  05/10/23 268 lb 8.3 oz (121.8 kg)  04/17/23 280 lb (127 kg)    Gen: no distress, morbid obesity  Neck: No JVD Cardiac: No Rubs or Gallops, no  Murmur, regular rhythm +2 radial pulses Respiratory: Clear to auscultation bilaterally, normal effort, normal  respiratory rate GI: Soft, nontender, non-distended  MS: trace bilateral non pitting  edema;  moves all extremities Integument: Skin feels warm Neuro:  At time of evaluation, alert and oriented to person/place/time/situation  Psych: Normal affect, patient feels OK  ASSESSMENT:    1. Essential hypertension   2. Hyperlipidemia, unspecified hyperlipidemia type   3. Elevated coronary artery calcium  score   4. Preoperative cardiovascular examination   5. Precordial pain   6. Chronic heart failure, unspecified heart failure type (HCC)      PLAN:    Chronic heart failure  Chronic heart failure with rare PVCs on EKG (resolved this year), no recent dyspnea since starting diuretics, and mild peripheral edema. Normal kidney function tests. - Order basic natriuretic peptide and basic metabolic panel - Continue Lasix  40 mg daily - Provide additional Lasix  40 mg as needed if natriuretic peptide is elevated - Continue spironolactone  to maintain potassium levels  Nonobstructive coronary artery disease Nonobstructive coronary artery disease with plaque buildup but adequate blood flow. Previous myalgias with statins. Cholesterol levels improved with weight loss. - Order LDL direct - Continue current management given statin myalgia  Hypertension - Hypertension well  controlled with current management. Heart rate is appropriate and slower than previous visits.  Hyperlipidemia Hyperlipidemia with LDL levels above goal but improving. Previous issues with statins. Current management focuses on lifestyle modifications and weight loss. - Order LDL direct - Avoid statins unless necessary due to previous myalgias  Obesity Obesity with significant weight loss from BMI 47 to 39. Currently on Wegovy  with slow but steady weight loss. Concerns about potential sarcopenia and constipation due to Wegovy . - Continue Wegovy  - Encourage intake of soluble and insoluble fiber - Advise on hydration and protein intake -  Recommend light strength training to prevent sarcopenia  Longitudinal care: The evaluation and management services provided today reflect the complexity inherent in caring for this patient, including the ongoing longitudinal relationship and management of multiple chronic conditions and/or the need for care coordination. The visit required a comprehensive assessment and management plan tailored to the patient's unique needs Time was spent addressing not only the acute concerns but also the broader context of the patient's health, including preventive care, chronic disease management, and care coordination as appropriate.  Complex longitudinal is necessary for conditions including: HFpEF and weight support for symptom burden management   One year f/u with me on Jackee Jacqueline Leavens, MD FASE [facility] Cardiologist [facility]  [address] [place], [place] [address] [phone]  1:07 PM

## 2024-03-03 NOTE — Patient Instructions (Signed)
 Medication Instructions:  Your physician recommends that you continue on your current medications as directed. Please refer to the Current Medication list given to you today.  *If you need a refill on your cardiac medications before your next appointment, please call your pharmacy*  Lab Work: At any [facility]: LDL Direct, BMP, BNP  If you have labs (blood work) drawn today and your tests are completely normal, you will receive your results only by: MyChart Message (if you have MyChart) OR A paper copy in the mail If you have any lab test that is abnormal or we need to change your treatment, we will call you to review the results.  Testing/Procedures: NONE  Follow-Up: At [facility], you and your health needs are our priority.  As part of our continuing mission to provide you with exceptional heart care, our providers are all part of one team.  This team includes your primary Cardiologist (physician) and Advanced Practice Providers or APPs (Physician Assistants and Nurse Practitioners) who all work together to provide you with the care you need, when you need it.  Your next appointment:   1 year(s)  Provider:   Stanly DELENA Leavens, MD or Jackee Alberts, NP

## 2024-03-04 DIAGNOSIS — I509 Heart failure, unspecified: Secondary | ICD-10-CM | POA: Diagnosis not present

## 2024-03-04 DIAGNOSIS — E785 Hyperlipidemia, unspecified: Secondary | ICD-10-CM | POA: Diagnosis not present

## 2024-03-05 ENCOUNTER — Ambulatory Visit: Payer: Self-pay

## 2024-03-05 LAB — BASIC METABOLIC PANEL WITH GFR
BUN/Creatinine Ratio: 20 (ref 12–28)
BUN: 17 mg/dL (ref 8–27)
CO2: 20 mmol/L (ref 20–29)
Calcium: 9.2 mg/dL (ref 8.7–10.3)
Chloride: 103 mmol/L (ref 96–106)
Creatinine, Ser: 0.84 mg/dL (ref 0.57–1.00)
Glucose: 91 mg/dL (ref 70–99)
Potassium: 4.4 mmol/L (ref 3.5–5.2)
Sodium: 139 mmol/L (ref 134–144)
eGFR: 76 mL/min/1.73 (ref >59)

## 2024-03-05 LAB — PRO B NATRIURETIC PEPTIDE: NT-Pro BNP: 85 pg/mL (ref 0–301)

## 2024-03-05 LAB — LDL CHOLESTEROL, DIRECT: LDL Direct: 113 mg/dL — ABNORMAL HIGH (ref 0–99)

## 2024-03-11 ENCOUNTER — Encounter: Payer: Self-pay | Admitting: Internal Medicine

## 2024-03-11 ENCOUNTER — Encounter: Payer: Self-pay | Admitting: Podiatry

## 2024-03-11 ENCOUNTER — Encounter: Payer: Self-pay | Admitting: Physician Assistant

## 2024-03-11 DIAGNOSIS — H2513 Age-related nuclear cataract, bilateral: Secondary | ICD-10-CM | POA: Diagnosis not present

## 2024-03-11 DIAGNOSIS — H52223 Regular astigmatism, bilateral: Secondary | ICD-10-CM | POA: Diagnosis not present

## 2024-03-11 DIAGNOSIS — H524 Presbyopia: Secondary | ICD-10-CM | POA: Diagnosis not present

## 2024-03-11 DIAGNOSIS — H5213 Myopia, bilateral: Secondary | ICD-10-CM | POA: Diagnosis not present

## 2024-03-11 DIAGNOSIS — I10 Essential (primary) hypertension: Secondary | ICD-10-CM | POA: Diagnosis not present

## 2024-03-11 MED ORDER — WEGOVY 2.4 MG/0.75ML ~~LOC~~ SOAJ: 2.4000 mg | SUBCUTANEOUS | 11 refills | Status: DC

## 2024-03-13 ENCOUNTER — Telehealth: Payer: Self-pay | Admitting: Pharmacy Technician

## 2024-03-13 ENCOUNTER — Telehealth: Payer: Self-pay | Admitting: Internal Medicine

## 2024-03-13 ENCOUNTER — Other Ambulatory Visit (HOSPITAL_COMMUNITY): Payer: Self-pay

## 2024-03-13 NOTE — Telephone Encounter (Signed)
 Pharmacy Patient Advocate Encounter  Received notification from [facility] that Prior Authorization for wegovy  2.4mg  has been APPROVED from 03/13/24 to 03/13/25   PA #/Case ID/Reference #: [id number]

## 2024-03-13 NOTE — Telephone Encounter (Signed)
 Pt c/o medication issue:  1. Name of Medication: Semaglutide -Weight Management (WEGOVY ) 2.4 MG/0.75ML SOAJ   2. How are you currently taking this medication (dosage and times per day)?    3. Are you having a reaction (difficulty breathing--STAT)? No   4. What is your medication issue? Prior shara is needed. Please advise

## 2024-03-13 NOTE — Telephone Encounter (Signed)
   Pharmacy Patient Advocate Encounter   Received notification from CoverMyMeds that prior authorization for wegovy  2.4mg  is required/requested.   Insurance verification completed.   The patient is insured through [facility] .   Per test claim: PA required; PA submitted to above mentioned insurance via latent Key/confirmation #/EOC [facility] Status is pending

## 2024-03-13 NOTE — Telephone Encounter (Signed)
 PA request has been Submitted. New Encounter has been or will be created for follow up. For additional info see Pharmacy Prior Auth telephone encounter from 03/13/24.

## 2024-03-18 NOTE — Progress Notes (Signed)
 Prior auth denied for [id number] / custom foot orthotics called patient today LM of cost if she'd like to move forward and appt needed to take scans / impressions  Jacqueline Mora CPed, CFo, CFm

## 2024-03-23 ENCOUNTER — Encounter: Payer: Self-pay | Admitting: Podiatry

## 2024-03-23 ENCOUNTER — Telehealth: Payer: Self-pay

## 2024-03-23 NOTE — Telephone Encounter (Signed)
 LVM to notify pt ins denied coverage for custom foot orthotics

## 2024-03-25 ENCOUNTER — Other Ambulatory Visit: Payer: Self-pay | Admitting: Internal Medicine

## 2024-04-06 ENCOUNTER — Other Ambulatory Visit (HOSPITAL_COMMUNITY): Payer: Self-pay

## 2024-04-06 ENCOUNTER — Telehealth: Payer: Self-pay

## 2024-04-06 NOTE — Telephone Encounter (Signed)
 Can try PA for Zepbound. Did we ever get a copy of pt's sleep study?

## 2024-04-06 NOTE — Telephone Encounter (Signed)
-----   Message from Jacqueline Mora sent at 04/06/2024  9:11 AM EDT ----- Patient wants to see if her insurance will cover Zepbound instead. Her AHI is 7.8. told her they may not pay, but we will try. She says We[employer]  isnt working

## 2024-04-08 ENCOUNTER — Other Ambulatory Visit (HOSPITAL_COMMUNITY): Payer: Self-pay

## 2024-04-08 MED ORDER — WEGOVY 1 MG/0.5ML ~~LOC~~ SOAJ: 1.0000 mg | SUBCUTANEOUS | 0 refills | Status: DC

## 2024-04-08 NOTE — Telephone Encounter (Signed)
PA request has been Submitted.

## 2024-04-10 NOTE — Telephone Encounter (Signed)
 Pharmacy Patient Advocate Encounter  Received notification from [facility] that Prior Authorization for ZEPBOUND has been DENIED.  Full denial letter will be uploaded to the media tab. See denial reason below.  ONLY COVERED IF PT HAS MODERATE-SEVERE OSA

## 2024-04-13 ENCOUNTER — Other Ambulatory Visit (HOSPITAL_COMMUNITY): Payer: Self-pay

## 2024-04-16 DIAGNOSIS — F331 Major depressive disorder, recurrent, moderate: Secondary | ICD-10-CM | POA: Diagnosis not present

## 2024-04-16 DIAGNOSIS — J309 Allergic rhinitis, unspecified: Secondary | ICD-10-CM | POA: Diagnosis not present

## 2024-04-22 ENCOUNTER — Encounter: Payer: Self-pay | Admitting: Internal Medicine

## 2024-05-04 ENCOUNTER — Other Ambulatory Visit: Payer: Self-pay | Admitting: Internal Medicine

## 2024-05-12 ENCOUNTER — Encounter: Payer: Self-pay | Admitting: Podiatry

## 2024-05-13 ENCOUNTER — Other Ambulatory Visit: Payer: Self-pay | Admitting: Podiatry

## 2024-05-13 MED ORDER — METHYLPREDNISOLONE 4 MG PO TBPK: ORAL_TABLET | ORAL | 0 refills | Status: AC

## 2024-05-13 MED ORDER — LIDOCAINE 5 % EX OINT: 1.0000 | TOPICAL_OINTMENT | CUTANEOUS | 0 refills | Status: AC | PRN

## 2024-05-14 ENCOUNTER — Telehealth: Payer: Self-pay

## 2024-05-14 NOTE — Telephone Encounter (Signed)
 PA request submitted thorugh CoverMyMEds and waiting on response.  Jacqueline Mora  (Key: [id number]) Rx #: [phone]

## 2024-05-18 ENCOUNTER — Encounter: Payer: Self-pay | Admitting: Internal Medicine

## 2024-05-20 NOTE — Telephone Encounter (Signed)
 PA was denied

## 2024-06-19 ENCOUNTER — Other Ambulatory Visit: Payer: Self-pay | Admitting: Internal Medicine

## 2024-06-19 MED ORDER — EZETIMIBE 10 MG PO TABS: 10.0000 mg | ORAL_TABLET | Freq: Every day | ORAL | 2 refills | Status: AC

## 2024-06-19 MED ORDER — SPIRONOLACTONE 25 MG PO TABS: 25.0000 mg | ORAL_TABLET | Freq: Every day | ORAL | 2 refills | Status: AC

## 2024-07-01 DIAGNOSIS — E559 Vitamin D deficiency, unspecified: Secondary | ICD-10-CM | POA: Diagnosis not present

## 2024-07-01 DIAGNOSIS — F3341 Major depressive disorder, recurrent, in partial remission: Secondary | ICD-10-CM | POA: Diagnosis not present

## 2024-07-01 DIAGNOSIS — E7800 Pure hypercholesterolemia, unspecified: Secondary | ICD-10-CM | POA: Diagnosis not present

## 2024-07-01 DIAGNOSIS — E039 Hypothyroidism, unspecified: Secondary | ICD-10-CM | POA: Diagnosis not present

## 2024-07-01 DIAGNOSIS — I10 Essential (primary) hypertension: Secondary | ICD-10-CM | POA: Diagnosis not present

## 2024-07-02 LAB — LAB REPORT - SCANNED
A1c: 5.3
EGFR: 73
TSH: 0.42 (ref 0.41–5.90)

## 2024-07-06 ENCOUNTER — Other Ambulatory Visit: Payer: Self-pay | Admitting: Surgery

## 2024-07-06 DIAGNOSIS — R1030 Lower abdominal pain, unspecified: Secondary | ICD-10-CM

## 2024-07-09 DIAGNOSIS — Z01419 Encounter for gynecological examination (general) (routine) without abnormal findings: Secondary | ICD-10-CM | POA: Diagnosis not present

## 2024-07-09 DIAGNOSIS — Z6841 Body Mass Index (BMI) 40.0 and over, adult: Secondary | ICD-10-CM | POA: Diagnosis not present

## 2024-07-09 DIAGNOSIS — Z1231 Encounter for screening mammogram for malignant neoplasm of breast: Secondary | ICD-10-CM | POA: Diagnosis not present

## 2024-07-10 ENCOUNTER — Ambulatory Visit
Admission: RE | Admit: 2024-07-10 | Discharge: 2024-07-10 | Disposition: A | Discharge status: Home / Self Care | Source: Ambulatory Visit | Attending: Surgery | Admitting: Surgery

## 2024-07-10 DIAGNOSIS — R1030 Lower abdominal pain, unspecified: Secondary | ICD-10-CM

## 2024-07-10 DIAGNOSIS — K760 Fatty (change of) liver, not elsewhere classified: Secondary | ICD-10-CM | POA: Diagnosis not present

## 2024-07-10 MED ORDER — IOPAMIDOL (ISOVUE-300) INJECTION 61%
100.0000 mL | Freq: Once | INTRAVENOUS | Status: AC | PRN
  Administered 2024-07-10: 100 mL via INTRAVENOUS

## 2024-07-28 ENCOUNTER — Encounter: Payer: Self-pay | Admitting: Internal Medicine

## 2024-07-28 MED ORDER — WEGOVY 1.7 MG/0.75ML ~~LOC~~ SOAJ: 1.7000 mg | SUBCUTANEOUS | 3 refills | Status: DC

## 2024-07-29 ENCOUNTER — Telehealth: Payer: Self-pay | Admitting: Internal Medicine

## 2024-07-29 NOTE — Telephone Encounter (Signed)
 Good afternoon Dr. Albertus  The following patient has been referred to us  for a colonoscopy. She has been a patient of Dr. Rosalie and since he is not long there she is requesting to transfer care to you. Records are available in Epic care everywhere. Please review and advise of scheduling. Thank you.

## 2024-07-30 MED ORDER — WEGOVY 2.4 MG/0.75ML ~~LOC~~ SOAJ: 2.4000 mg | SUBCUTANEOUS | 5 refills | Status: AC

## 2024-07-30 NOTE — Telephone Encounter (Signed)
 Alan could you help get some records for this patient he is requesting colonoscopy The scheduler mention Dr. Rosalie but the chart mentions Dr. Luis I cannot see her last colonoscopy or pathology results to know for sure when she is due for screening or surveillance Once I have these records we can make that decision You can make patient aware that we are gathering information please thanks

## 2024-07-30 NOTE — Telephone Encounter (Signed)
 Spoke with patient and she states her previous gastroenterologist was Dr. Luis 5 years ago. Informed patient Dr. Albertus would like to review these records prior to scheduling a colonoscopy. Asked patient to come by our office to sign a ROI to get the records. Patient agreed and will come by tomorrow and ask for me.

## 2024-07-30 NOTE — Addendum Note (Signed)
 Addended by: DARRELL BRUCKNER on: 07/30/2024 04:40 PM   Modules accepted: Orders

## 2024-07-31 NOTE — Telephone Encounter (Signed)
 Patient came by office and signed a ROI to get records from Dr. Marshell office. Patient would also like Dr. Albertus to review her last CT scan from Dr. Sheldon regarding her hernia surgery.

## 2024-08-04 ENCOUNTER — Telehealth: Payer: Self-pay | Admitting: Pharmacy Technician

## 2024-08-04 ENCOUNTER — Other Ambulatory Visit (HOSPITAL_COMMUNITY): Payer: Self-pay

## 2024-08-04 NOTE — Telephone Encounter (Signed)
 Pharmacy Patient Advocate Encounter   Received notification from Physician's Office that prior authorization for repatha  is required/requested.   Insurance verification completed.   The patient is insured through [employer].   Per test claim: PA required; PA submitted to above mentioned insurance via Latent Key/confirmation #/EOC [id number] Status is pending

## 2024-08-05 ENCOUNTER — Other Ambulatory Visit (HOSPITAL_COMMUNITY): Payer: Self-pay

## 2024-08-05 NOTE — Telephone Encounter (Signed)
 Pharmacy Patient Advocate Encounter  Received notification from [facility] that Prior Authorization for repatha  has been APPROVED from 08/04/24 to 08/04/25. Ran test claim, Copay is $0.00- one month. This test claim was processed through [facility]- copay amounts may vary at other pharmacies due to pharmacy/plan contracts, or as the patient moves through the different stages of their insurance plan.   PA #/Case ID/Reference #: [id number]

## 2024-08-06 ENCOUNTER — Encounter: Payer: Self-pay | Admitting: Pharmacist

## 2024-08-06 MED ORDER — REPATHA SURECLICK 140 MG/ML ~~LOC~~ SOAJ: 1.0000 mL | SUBCUTANEOUS | 3 refills | Status: AC

## 2024-08-06 NOTE — Addendum Note (Signed)
 Addended by: Ruthene Methvin D on: 08/06/2024 01:22 PM   Modules accepted: Orders

## 2024-08-06 NOTE — Telephone Encounter (Signed)
 Re-faxed ROI to Dr. Marshell office.

## 2024-08-10 NOTE — Telephone Encounter (Signed)
 Received another fax from medical records from [facility] that is blurry and cannot be read. Contacted [facility] again to let them know we cannot read the report. Joesph at medical records states she will email me the report.

## 2024-08-10 NOTE — Telephone Encounter (Signed)
 Recall colonoscopy is April 2026 I have reviewed the [facility] last colonoscopy and he recommended 5 yrs from April 2021 Please inform patient and place recall Thanks JMP

## 2024-08-10 NOTE — Telephone Encounter (Signed)
 Received email medical records. Placed on Dr. Pamula desk for review. Dr. Albertus, patient wants you to also review her last CT scan in Epic that her surgeon ordered. Thanks.

## 2024-08-10 NOTE — Telephone Encounter (Signed)
 Received records from [facility] this morning. The document we received is illegible. Contacted the medical records and spoke with [facility] and requested they be faxed again.

## 2024-08-11 NOTE — Telephone Encounter (Signed)
 Left a detailed message informing patient that her recommended recall is 11/2024 and we have placed a recall in our system.

## 2024-08-25 ENCOUNTER — Other Ambulatory Visit (HOSPITAL_COMMUNITY): Payer: Self-pay

## 2024-09-02 ENCOUNTER — Other Ambulatory Visit: Payer: Self-pay | Admitting: Lab

## 2024-09-08 ENCOUNTER — Encounter: Payer: Self-pay | Admitting: Internal Medicine

## 2024-09-08 ENCOUNTER — Encounter: Payer: Self-pay | Admitting: Podiatry

## 2024-09-09 NOTE — Telephone Encounter (Signed)
 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

## 2024-09-11 ENCOUNTER — Telehealth: Payer: Self-pay | Admitting: Pharmacy Technician

## 2024-09-11 ENCOUNTER — Other Ambulatory Visit (HOSPITAL_COMMUNITY): Payer: Self-pay

## 2024-09-11 NOTE — Telephone Encounter (Signed)
 Pharmacy Patient Advocate Encounter  Received notification from [facility] that Prior Authorization for zepbound has been DENIED.  Full denial letter will be uploaded to the media tab. See denial reason below.   PA #/Case ID/Reference #: [id number]

## 2024-09-11 NOTE — Telephone Encounter (Signed)
" ° °  Pharmacy Patient Advocate Encounter   Received notification from Patient Advice Request messages that prior authorization for zepbound is required/requested.   Insurance verification completed.   The patient is insured through [facility].   Per test claim: PA required; PA submitted to above mentioned insurance via Latent Key/confirmation #/EOC [id number] Status is pending   Also wegovy  is still going through but 755.10 due to 581.47 going to deductible and then the 173.63 coinsurance cost which would be the cost going forward.  "
